# Patient Record
Sex: Male | Born: 1935 | Race: White | Hispanic: No | Smoking: Former smoker
Health system: Southern US, Community
[De-identification: ages and names within clinical notes are randomized; demographics above are authoritative.]

## PROBLEM LIST (undated history)

## (undated) DIAGNOSIS — R5381 Other malaise: Secondary | ICD-10-CM

## (undated) DIAGNOSIS — K5900 Constipation, unspecified: Secondary | ICD-10-CM

## (undated) DIAGNOSIS — N183 Chronic kidney disease, stage 3 unspecified: Secondary | ICD-10-CM

## (undated) DIAGNOSIS — E079 Disorder of thyroid, unspecified: Secondary | ICD-10-CM

## (undated) DIAGNOSIS — R0600 Dyspnea, unspecified: Secondary | ICD-10-CM

## (undated) DIAGNOSIS — I10 Essential (primary) hypertension: Secondary | ICD-10-CM

## (undated) DIAGNOSIS — I480 Paroxysmal atrial fibrillation: Secondary | ICD-10-CM

## (undated) DIAGNOSIS — I5032 Chronic diastolic (congestive) heart failure: Secondary | ICD-10-CM

## (undated) DIAGNOSIS — I498 Other specified cardiac arrhythmias: Secondary | ICD-10-CM

## (undated) DIAGNOSIS — J449 Chronic obstructive pulmonary disease, unspecified: Secondary | ICD-10-CM

## (undated) DIAGNOSIS — I509 Heart failure, unspecified: Secondary | ICD-10-CM

## (undated) DIAGNOSIS — D72829 Elevated white blood cell count, unspecified: Secondary | ICD-10-CM

## (undated) DIAGNOSIS — H02109 Unspecified ectropion of unspecified eye, unspecified eyelid: Secondary | ICD-10-CM

## (undated) DIAGNOSIS — I499 Cardiac arrhythmia, unspecified: Secondary | ICD-10-CM

## (undated) DIAGNOSIS — K219 Gastro-esophageal reflux disease without esophagitis: Secondary | ICD-10-CM

## (undated) DIAGNOSIS — I493 Ventricular premature depolarization: Secondary | ICD-10-CM

## (undated) DIAGNOSIS — Z8489 Family history of other specified conditions: Secondary | ICD-10-CM

## (undated) DIAGNOSIS — D62 Acute posthemorrhagic anemia: Secondary | ICD-10-CM

## (undated) DIAGNOSIS — S72142A Displaced intertrochanteric fracture of left femur, initial encounter for closed fracture: Secondary | ICD-10-CM

## (undated) HISTORY — DX: Displaced intertrochanteric fracture of left femur, initial encounter for closed fracture: S72.142A

## (undated) HISTORY — DX: Dyspnea, unspecified: R06.00

## (undated) HISTORY — DX: Disorder of thyroid, unspecified: E07.9

## (undated) HISTORY — DX: Chronic diastolic (congestive) heart failure: I50.32

## (undated) HISTORY — DX: Other malaise: R53.81

## (undated) HISTORY — PX: SPLENECTOMY, PARTIAL: SHX787

## (undated) HISTORY — PX: CHOLECYSTECTOMY: SHX55

## (undated) HISTORY — DX: Unspecified ectropion of unspecified eye, unspecified eyelid: H02.109

## (undated) HISTORY — DX: Chronic kidney disease, stage 3 (moderate): N18.3

## (undated) HISTORY — DX: Constipation, unspecified: K59.00

## (undated) HISTORY — DX: Ventricular premature depolarization: I49.3

## (undated) HISTORY — DX: Paroxysmal atrial fibrillation: I48.0

## (undated) HISTORY — DX: Acute posthemorrhagic anemia: D62

## (undated) HISTORY — DX: Heart failure, unspecified: I50.9

## (undated) HISTORY — DX: Chronic kidney disease, stage 3 unspecified: N18.30

## (undated) HISTORY — DX: Elevated white blood cell count, unspecified: D72.829

---

## 1998-07-14 ENCOUNTER — Ambulatory Visit (HOSPITAL_COMMUNITY): Admission: RE | Admit: 1998-07-14 | Discharge: 1998-07-14 | Payer: Self-pay | Admitting: Family Medicine

## 1998-07-14 ENCOUNTER — Encounter: Payer: Self-pay | Admitting: Family Medicine

## 1998-08-13 ENCOUNTER — Observation Stay (HOSPITAL_COMMUNITY): Admission: RE | Admit: 1998-08-13 | Discharge: 1998-08-14 | Payer: Self-pay

## 2004-12-08 ENCOUNTER — Ambulatory Visit: Payer: Self-pay | Admitting: Family Medicine

## 2004-12-08 ENCOUNTER — Observation Stay (HOSPITAL_COMMUNITY): Admission: EM | Admit: 2004-12-08 | Discharge: 2004-12-09 | Payer: Self-pay | Admitting: Emergency Medicine

## 2006-11-29 ENCOUNTER — Encounter: Admission: RE | Admit: 2006-11-29 | Discharge: 2006-11-29 | Payer: Self-pay | Admitting: Family Medicine

## 2006-11-30 ENCOUNTER — Encounter (INDEPENDENT_AMBULATORY_CARE_PROVIDER_SITE_OTHER): Payer: Self-pay | Admitting: Gastroenterology

## 2006-11-30 ENCOUNTER — Ambulatory Visit (HOSPITAL_COMMUNITY): Admission: RE | Admit: 2006-11-30 | Discharge: 2006-12-01 | Payer: Self-pay | Admitting: Gastroenterology

## 2006-12-03 ENCOUNTER — Ambulatory Visit (HOSPITAL_COMMUNITY): Admission: RE | Admit: 2006-12-03 | Discharge: 2006-12-03 | Payer: Self-pay | Admitting: Gastroenterology

## 2007-06-13 ENCOUNTER — Encounter: Admission: RE | Admit: 2007-06-13 | Discharge: 2007-06-13 | Payer: Self-pay | Admitting: Gastroenterology

## 2008-08-18 ENCOUNTER — Ambulatory Visit (HOSPITAL_COMMUNITY): Admission: RE | Admit: 2008-08-18 | Discharge: 2008-08-18 | Payer: Self-pay | Admitting: Family Medicine

## 2010-07-26 NOTE — H&P (Signed)
Jordan [name], Jordan [name]                ACCOUNT NO.:  [id number]   MEDICAL RECORD NO.:  [id number]          PATIENT TYPE:  OIB   LOCATION:  5729                         FACILITY:  [facility]   PHYSICIAN:  [name], MDDATE OF BIRTH:  10-20-1918   DATE OF ADMISSION:  11/30/2006  DATE OF DISCHARGE:                              HISTORY & PHYSICAL   ADMISSION DIAGNOSES:  1. Dysphagia.  2. Esophageal stricture.   HISTORY OF PRESENT ILLNESS:  An 78 year old, white male who reports a  prolonged history of trouble swallowing foods where he feels like it  hangs up which he attributes to a hiatal hernia that he says he has.  He  was seen by his primary care physician regarding this dysphagia and had  a swallowing study done on November 29, 2006, which showed a short  segment narrowing versus stricture at the superior margin of a small,  sliding hiatal hernia.  A 13-mm tablet did not pass and the short  segment stricture was noted to be wider than a typical superior  esophageal ring.  After conferring with his primary doctor and conferred  with Dr. Ewing Schlein, he was sent over for outpatient upper endoscopy.  During  his upper endoscopy, he had a tight stricture at his GE junction  concerning for malignancy.  There was no obvious mass, but the cardia  was nodular and biopsies were taken and dilation was not attempted.  The  patient will be placed on observation status and a CT scan will be  obtained.  He will be given a liquid diet and IV fluids.  The patient  may undergo dilation pending pathology results and CAT scan findings.      [name], MD  Electronically Signed     VCS/MEDQ  D:  11/30/2006  T:  12/01/2006  Job:  [id number]   cc:   [name], M.D.  Petra Kuba, M.D.

## 2010-07-26 NOTE — Op Note (Signed)
Jordan [name], [name]                ACCOUNT NO.:  [id number]   MEDICAL RECORD NO.:  [id number]          PATIENT TYPE:  AMB   LOCATION:  ENDO                         FACILITY:  [name]   PHYSICIAN:  Petra Kuba, M.D.    DATE OF BIRTH:  [birth year]   DATE OF PROCEDURE:  12/03/2006  DATE OF DISCHARGE:                               OPERATIVE REPORT   PROCEDURE:  Esophagogastroduodenoscopy with balloon dilatation.   INDICATIONS FOR PROCEDURE:  Patient with dysphagia, esophageal  stricture, nondiagnostic biopsies and CT scan. Consent was signed after  risks, benefits, methods, options survey discussed with the patient and  his daughter.   MEDICINES USED:  Fentanyl 50 mcg, Versed 5 mg.   DESCRIPTION OF PROCEDURE:  The video endoscope was inserted by direct  vision. Again, in the distal esophagus a tight ring was seen, unable to  pass the scope. We did go ahead and advance by direct vision the  adjustable 8-cm balloon 10 to 12 mm and went ahead and inflated it,  first to 10 mm, held that for 30 seconds, then increased it to 11 mm for  30 seconds and then to 12 for 45 seconds. Once the balloon was deflated  we were easily able to advance the scope into the stomach. Scope was  advanced to the antrum pertinent for some mild antritis, through a  normal pylorus into a normal duodenal bulb and around the C-lip to a  normal second portion of the duodenum.   The scope was withdrawn back to the bulb and a good look there ruled out  ulcers in that location.   The scope was withdrawn back to the stomach and retroflexed. There was  minimal heme coming from the small hiatal hernia in the cardia. No  obvious mass lesion was seen. The fundus, angularis, lesser and greater  curve were normal on retroflexed visualization. Straight visualization  of the stomach did not reveal any additional findings. We went ahead and  slowly withdrew back to 20 cm. Again, his esophagus was slightly  tortuous. The ring  had a little bit of heme but no active bleeding or  significant tear. After reevaluating it, we elected to go ahead and  balloon dilate it to the next size. The adjustable 12 to 15 mm balloon  was advanced. It was an 8 cm long balloon. We blew up this balloon to  13.5 mm for 30 seconds and then increased to 15 for 1 minute. The  balloon was deflated. Again, we were easily able to advance back in the  stomach, air was suctioned.   The scope was slowly withdrawn. There was a little bit of ooze from  trauma from the 15 but no obvious significant complication. The scope  was removed. The patient tolerated the procedure well. There was no  obvious immediate complication.   ENDOSCOPIC DIAGNOSES:  1. Tight ring in the distal esophagus, unable to pass the regular      scope, status post balloon dilatation initially to 12 mm and then      passed the scope.  2. Minimal antritis.  3. Small hiatal hernia.  4. Otherwise normal esophagogastroduodenoscopy therapy, balloon      dilatation to 15 mm using the 8 cm to different adjustable balloon      plan. Observe for delayed complications. Slowly advance diet. No      BCs, aspirin, nonsteroidals. Tylenol okay. Pump inhibitors long      term. Follow up p.r.n. or in 1 month. Repeat dilation p.r.n.           ______________________________  Petra Kuba, M.D.     MEM/MEDQ  D:  12/03/2006  T:  12/04/2006  Job:  [id number]   cc:   [name], M.D.  Fax: [phone]   [name], MD  Fax: [phone]

## 2010-07-26 NOTE — Op Note (Signed)
NAMEGLENDON, Jordan [name]                ACCOUNT NO.:  [id number]   MEDICAL RECORD NO.:  [id number]          PATIENT TYPE:  OIB   LOCATION:  2866                         FACILITY:  [facility]   PHYSICIAN:  [name], MDDATE OF BIRTH:  [birth year]   DATE OF PROCEDURE:  11/30/2006  DATE OF DISCHARGE:                               OPERATIVE REPORT   PROCEDURE PERFORMED:  Upper endoscopy.   INDICATIONS:  Dysphagia, abnormal barium swallow showing a short segment  stricture versus lesion at the GE junction.   MEDICATIONS:  Fentanyl 70 mcg IV, Versed 5 mg IV.   FINDINGS:  The standard upper endoscope was inserted into the oropharynx  and the esophagus was intubated.  The endoscope was advanced down to the  distal portion of the esophagus where a food bolus was noted obscuring  the lumen.  A Rothnet was used to remove this food bolus.  Upon  reinsertion of the standard endoscope back into the oropharynx and down  to the esophagus, a tight stricture was noted at the GE junction.  This  prevented passage of the standard endoscope through this stricture.  The  standard endoscope was removed and a pediatric upper endoscope was  inserted which has an outer diameter of 8 mm.  This was advanced down to  the level of the stricture and advanced through the stricture without  any resistance into the stomach.  Retroflexion was done which revealed a  nodular cardia with no ulceration but nodular mucosa on one area of the  cardia.  No fungating mass was noted but the cardia was bulging right at  the GE junction.  The endoscope was straightened and advanced down into  the distal stomach which was normal in appearance.  The endoscope was  advanced into the duodenal bulb and second portion of the duodenum which  were both normal.  The endoscope was withdrawn back into the stomach.  The decision was made to do biopsies without dilation due to the  appearance of this stricture.  Biopsies were taken on the  cardia side as  well as the esophageal side.  Again, dilation was not attempted and  fluoroscopy was not necessary.   ASSESSMENT:  1. Tight stricture at the gastroesophageal junction (42 cm from the      incisors) concerning for malignant stricture status post multiple      biopsies.  2. Dilation not attempted due to concern for malignancy.   PLAN:  1. Placed on observation status.  2. Obtain a CT scan of the abdomen and chest to further evaluate this      stricture.  3. Clear liquid diet and IV fluids.  4. Follow up on path.  5. If the patient tolerates a liquid diet and CT scan is negative,      will need to decide whether to dilate prior to      pathology resulting.  My recommendation would be to hold off on      dilation until pathology has returned, that is assuming he can      tolerate a liquid diet.  Further  recommendation will be forthcoming      pending pathology and CAT scan findings.      [name], MD  Electronically Signed     VCS/MEDQ  D:  11/30/2006  T:  12/01/2006  Job:  [id number]   cc:   [name], M.D.  Petra Kuba, M.D.

## 2010-07-29 NOTE — Discharge Summary (Signed)
Jordan [name], Jordan [name]                ACCOUNT NO.:  [id number]   MEDICAL RECORD NO.:  [id number]          PATIENT TYPE:  INP   LOCATION:  3703                         FACILITY:  [facility]   PHYSICIAN:  Leighton Roach McDiarmid, M.D.DATE OF BIRTH:  10-May-1918   DATE OF ADMISSION:  12/08/2004  DATE OF DISCHARGE:  12/09/2004                                 DISCHARGE SUMMARY   ADDENDUM:   DISCHARGE INSTRUCTIONS:  1.  The patient is to follow up with Dr. Jeannetta Nap at [facility] on Wednesday, December 14, 2004, at 9:30 a.m.  2.  He is to go to his scheduled MRI appointment of the left shoulder as      previously scheduled.   PROCEDURE:  Left shoulder films were negative for a fracture.  Did show  osteopenia, but no tumor is present.      Alanson Puls, M.D.    ______________________________  Leighton Roach McDiarmid, M.D.    MR/MEDQ  D:  12/09/2004  T:  12/09/2004  Job:  [id number]   cc:   Attention:  Dr. Elly Modena [facility]

## 2010-07-29 NOTE — H&P (Signed)
NAMESEBRON, MCMAHILL                ACCOUNT NO.:  [id number]   MEDICAL RECORD NO.:  [id number]          PATIENT TYPE:  INP   LOCATION:  1823                         FACILITY:  [facility]   PHYSICIAN:  Leighton Roach McDiarmid, M.D.DATE OF BIRTH:  April 17, 1918   DATE OF ADMISSION:  12/08/2004  DATE OF DISCHARGE:                                HISTORY & PHYSICAL   CHIEF COMPLAINT:  Syncope.   PRIMARY CARE PHYSICIAN:  [name], M.D. at [facility].   HISTORY OF PRESENT ILLNESS:  Mr. Milliman is an 77 year old white male with a  history of hiatal hernia and left shoulder pain x1 week, treated for  osteoarthritis, who presents with a syncopal episode x1 today while awaiting  lunch at the [facility].  He denies any chest pain, shortness of  breath, or palpitations at the time of the episodes.  The patient states  that he was not exerting himself.  He states that he had diaphoresis, but  denied any amaurosis fugax or weakness at the time.  He states that he  thinks this episode is related to taking two Tylenol No.3 tablets last night  as well as two this morning when he usually takes one tablet for his left  shoulder pain.  The patient denies any fevers, chills, weight gain, or  weight loss, no nausea and vomiting.  He denies any change in stool  including diarrhea or constipation, no melena or hematochezia.  He denies  dysuria.   PAST MEDICAL HISTORY:  1.  Left shoulder pain of 1 weeks' duration.  2.  Hiatal hernia.   PAST SURGICAL HISTORY:  Splenectomy.  Cholecystectomy in 2000.   MEDICATIONS:  1.  TUMS.  2.  Prednisone 15 mg begin on November 30, 2004, discontinue on December 07, 2004.  3.  Tylenol No.3 which includes acetaminophen and Codeine 330.   FAMILY HISTORY:  Mother who is deceased, died at age 62 from gastric cancer.  Father deceased at age 19, died of a stroke.  No family history of diabetes,  hypercholesterolemia, hypertension, or heart  attack.   SOCIAL HISTORY:  The patient lives in [place], [place], and  lives with his daughter.  The patient has five daughters and two sons.  There is a remote tobacco history.  The patient states that he quit smoking  15 years ago, but previously smoked one pack per day.  He denies any alcohol  use or any illegal drug use.  He does not chew or dip tobacco.   REVIEW OF SYSTEMS:  Negative except for HPI.   PHYSICAL EXAMINATION:  VITAL SIGNS:  Temperature 97.5, blood pressure  127/59, heart rate 67, respiratory rate 22, pulse oximetry 94% on room air.  Weight is 88 kg, approximately 195 pounds.  GENERAL:  The patient is in no acute distress.  HEENT:  Pupils equal, round, and reactive to light and accommodation.  Extraocular muscles are intact.  There is swelling around the lower eye  lids.  HEART:  Regular rate and rhythm, heart rate in the 60s,  no murmurs heard.  No JVD or bruit.  Orthostatic vital signs were obtained, lying down 117/78  heart rate 58, sitting 127/76 heart rate 63, standing 125/66 heart rate 67.  LUNGS:  There were occasional end-expiratory wheezes.  ABDOMEN:  Obese with small cherry angiomas, no tenderness.  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  There is no edema.  The  patient has 2+ dorsalis pedis pulses.  NEUROLOGY:  No slurred speech or facial paresis.  5/5 upper and lower  extremity.   LABORATORY DATA:  CBC; white blood cell count 15.6, hemoglobin 16.1,  hematocrit 45.1, platelets 304 with 65% segs.  Chemistry panel; sodium 135,  potassium 4.2, chloride 101, bicarb 26, BUN 28, creatinine 1.6, glucose 116.  PT 13, PTT 22, INR 1.   EKG shows sinus bradycardia at 50 to 60 beats per minute, PR interval of  150, QRS of 92, QTC of 433.  No evidence of hypertrophy or ischemia.  Point  of care enzymes; CK-MB less than 1, troponin less than 0.05, myoglobin 42.6.   CT of  the head; no acute intracranial abnormality.  Chest x-ray shows  diffuse peribronchial thickening, scarring in the right upper lobe  consistent with COPD and flattened diaphragm.   Urinalysis; specific gravity of 1.029, pH 5, negative for nitrates and  leukocytes.   ASSESSMENT:  1.  This is an 77 year old with a history of hiatal hernia and left shoulder      pain.  2.  Syncope.  Consider a cardiac cause.  We will place the patient on a      telemetry bed, repeat cardiac enzymes three times q.8 hours and add on a      BNP to his next set of enzymes.  We will also watch for tachybrady      syndrome and further risk stratify the patient, given his age of 83.  We      will check a fasting lipid profile.  No further history of diabetes,      just a family history of stroke.  We will also get a TSH to rule out      mixed edema and repeat the EKG in the a.m.  For his bradycardia, since      he is sinus brady, consider vagal versus metabolic abnormalities or      medication induced.  We will continue to monitor.  3.  Chronic obstructive pulmonary disease suggestive on a chest x-ray and a      positive tobacco history, and the large lung volumes, we will give the      patient albuterol 2.5 mg q.6 hours nebulizers scheduled as well as      Atrovent 0.5 mg q.4 hours scheduled nebulizers with q.2 p.r.n.      albuterol.  We will monitor the patient and consider going to p.r.n.      nebulizers in the morning.  The patient was recently on a course of      prednisone on an outpatient basis for left shoulder pain.  This may      explain his increased white blood cell count.  We will defer on giving      the patient steroids at this time for COPD.  4.  Fluid, electrolytes, nutrition.  We will give the patient normal saline      at 50 an hour since he is taking  adequate p.o. and consider locking      fluids in the morning.  Electrolytes are stable.  He will receive a soft     mechanical diet secondary  to his hiatal hernia and per the patient's      request.   DISPOSITION:  We will continue to follow until we clarify the origin of the  patient's syncope.      Alanson Puls, M.D.    ______________________________  Leighton Roach McDiarmid, M.D.   MR/MEDQ  D:  12/08/2004  T:  12/09/2004  Job:  [id number]

## 2010-07-29 NOTE — Discharge Summary (Signed)
NAMEVERNARD, Jordan [name]                ACCOUNT NO.:  [id number]   MEDICAL RECORD NO.:  [id number]          PATIENT TYPE:  INP   LOCATION:  3703                         FACILITY:  [facility]   PHYSICIAN:  Alanson Puls, M.D.    DATE OF BIRTH:  [birth year]   DATE OF ADMISSION:  12/08/2004  DATE OF DISCHARGE:  12/09/2004                                 DISCHARGE SUMMARY   ATTENDING PHYSICIAN AT DISCHARGE:  Dr. Tawanna Cooler McDiarmid.   DISCHARGE DIAGNOSES:  1.  Iatrogenic syncope.  2.  Left shoulder pain, likely osteoarthritis.  3.  Hypertension.   PROCEDURES DURING HOSPITALIZATION:  1.  CT scan of the head without contrast.  2.  Films of the left arm and shoulder.   CONSULTATIONS:  None.   HOSPITAL COURSE:  In brief, Jordan [name] is an 77 year old white male with a  history of hiatal hernia and left shoulder pain thought to be osteoarthritis  who presented with a syncopal episode x1 yesterday while awaiting lunch at  the [facility].  He denied any chest pain or shortness of breath  or palpitations at that time.  Denied any amaurosis fugax, but did state  that he was diaphoretic.  The patient thinks that this episode is related to  taking 2 Tylenol No. 2, at least 2 hours apart and 2 hours before the  syncopal event.  Patient states that he usually takes 1 Tylenol No. 2 for  his left shoulder pain.  He was recently on prednisone 50 mg p.o. regimen  for 7 days and discontinued secondary to no improvement in his left shoulder  pain and that is when he was placed on Tylenol No. 3.  Given the patient's  history of syncope, we were considering a cardiac cause.  Cardiac enzymes  were negative x2 and his BNP was less than 30.  Initial EKG did show sinus  bradycardia.  The patient was placed on a telemetry bed with no episodes of  tachycardia or bradycardia over night.  On exam, there was no evidence of  stroke and CT scan of the head was negative for any intracranial  abnormalities.   For  further risk stratification, a fasting lipid profile was obtained, which  results are pending at this time. TSH was also within normal limits at  2.941.  There were no electrolyte abnormalities at the time, and  particularly the patient's fingerstick blood sugar was 122.  Orthostatic  vital signs were obtained, which were negative.   For the patient's bradycardia, sinus bradycardia, considered vagal/metabolic  abnormality or medication induced; however, he was not on any beta blockers  at this time and no other episodes of bradycardia throughout his  hospitalization.   There is questionable history of COPD.  Patient states that he smoked for a  number of years and quit 15 years ago.  His chest x-ray did not reveal any  infiltrate or pneumonia, but it did show low lung volumes with flattened  diaphragm.  We placed the patient on albuterol 2.5 mg q.6 and Atrovent 0.5  mg q.4 scheduled nebs and then changed  them over to p.r.n.  The patient's  respiratory status did improve and he no longer had any wheezing.  At some  point, we do recommend formal pulmonary function tests to assess the  patient's lung status.   Secondary to the left shoulder pain, we also considered types of referred  pain.  Although the patient did not have any complains of nausea and  vomiting, if this continues to occur, we recommend checking amylase and  lipase.   Films of the left arm and shoulder were obtained, which are pending at this  time.  We have results sent to Dr. Jeannetta Nap' office.  Patient did have  tenderness over the deltoid insertion, and if this is thought to be  osteoarthritis, instead of using p.o. pain meds, we recommend that maybe the  patient try a corticosteroid injection at that site.   Fluid, electrolytes and nutrition:  The patient was initially placed on  normal saline at 50.  He was taking adequate p.o.'s, so his IV fluids were  discontinued.  His electrolytes were stable, and he was placed on  a soft  mechanical diet secondary to his hiatal hernia.   For hypertension, the patient previously carried no diagnosis of  hypertension; but, of note, while in the hospital, his systolic blood  pressures ranged from 127-148.  He was not placed on any antihypertensive  medications, but we recommend followup on an outpatient basis.   DISCHARGE MEDICATIONS:  1.  Aspirin 81 mg p.o. q.d.  2.  Albuterol metered dose inhaler to take 2 puffs q.4-6h. p.r.n. wheeze      with 1 refill given.  All other refills through primary care physician.   FOLLOWUP INSTRUCTIONS:  The patient is to follow up with Dr. Jeannetta Nap at  [facility] as previously scheduled, because the patient  is to call to schedule followup appointment.  He was told to return to the  clinic or the emergency department for syncope, dizziness, lightheadedness,  weakness, or any other concerns.      Alanson Puls, M.D.     MR/MEDQ  D:  12/09/2004  T:  12/09/2004  Job:  [id number]

## 2010-12-22 LAB — BASIC METABOLIC PANEL
BUN: 20
CO2: 27
Chloride: 105
Chloride: 108
Creatinine, Ser: 1.36
GFR calc Af Amer: >60
Potassium: 3.6
Potassium: 4
Sodium: 138

## 2010-12-22 LAB — CBC
HCT: 39.5
HCT: 43.5
Hemoglobin: 14.2
Hemoglobin: 15.3
MCHC: 35.9
MCV: 94.6
Platelets: 308
RBC: 4.18 — ABNORMAL LOW
WBC: 10.8 — ABNORMAL HIGH
WBC: 9.8

## 2010-12-22 LAB — HEPATIC FUNCTION PANEL
ALT: 12
Alkaline Phosphatase: 79
Bilirubin, Direct: 0.2
Indirect Bilirubin: 0.9
Total Bilirubin: 1.1

## 2010-12-29 ENCOUNTER — Ambulatory Visit
Admission: RE | Admit: 2010-12-29 | Discharge: 2010-12-29 | Disposition: A | Discharge status: Home / Self Care | Payer: Medicare Other | Source: Ambulatory Visit | Attending: Family Medicine | Admitting: Family Medicine

## 2010-12-29 ENCOUNTER — Other Ambulatory Visit: Payer: Self-pay | Admitting: Family Medicine

## 2010-12-29 DIAGNOSIS — M545 Low back pain: Secondary | ICD-10-CM

## 2012-12-16 ENCOUNTER — Inpatient Hospital Stay (HOSPITAL_COMMUNITY)
Admission: EM | Admit: 2012-12-16 | Discharge: 2012-12-20 | DRG: 309 | Disposition: A | Discharge status: Home / Self Care | Payer: Medicare Other | Attending: Cardiology | Admitting: Cardiology

## 2012-12-16 ENCOUNTER — Emergency Department (HOSPITAL_COMMUNITY): Payer: Medicare Other

## 2012-12-16 ENCOUNTER — Encounter (HOSPITAL_COMMUNITY): Payer: Self-pay | Admitting: Emergency Medicine

## 2012-12-16 DIAGNOSIS — J449 Chronic obstructive pulmonary disease, unspecified: Secondary | ICD-10-CM | POA: Diagnosis present

## 2012-12-16 DIAGNOSIS — I509 Heart failure, unspecified: Secondary | ICD-10-CM | POA: Diagnosis present

## 2012-12-16 DIAGNOSIS — R0600 Dyspnea, unspecified: Secondary | ICD-10-CM

## 2012-12-16 DIAGNOSIS — J4489 Other specified chronic obstructive pulmonary disease: Secondary | ICD-10-CM | POA: Diagnosis present

## 2012-12-16 DIAGNOSIS — E860 Dehydration: Secondary | ICD-10-CM | POA: Diagnosis present

## 2012-12-16 DIAGNOSIS — Z7901 Long term (current) use of anticoagulants: Secondary | ICD-10-CM

## 2012-12-16 DIAGNOSIS — I10 Essential (primary) hypertension: Secondary | ICD-10-CM | POA: Diagnosis present

## 2012-12-16 DIAGNOSIS — I4891 Unspecified atrial fibrillation: Principal | ICD-10-CM | POA: Diagnosis present

## 2012-12-16 DIAGNOSIS — R001 Bradycardia, unspecified: Secondary | ICD-10-CM | POA: Diagnosis present

## 2012-12-16 DIAGNOSIS — I2510 Atherosclerotic heart disease of native coronary artery without angina pectoris: Secondary | ICD-10-CM | POA: Diagnosis present

## 2012-12-16 DIAGNOSIS — N179 Acute kidney failure, unspecified: Secondary | ICD-10-CM | POA: Diagnosis present

## 2012-12-16 DIAGNOSIS — N183 Chronic kidney disease, stage 3 unspecified: Secondary | ICD-10-CM | POA: Diagnosis present

## 2012-12-16 DIAGNOSIS — Z87891 Personal history of nicotine dependence: Secondary | ICD-10-CM

## 2012-12-16 DIAGNOSIS — J441 Chronic obstructive pulmonary disease with (acute) exacerbation: Secondary | ICD-10-CM

## 2012-12-16 DIAGNOSIS — I129 Hypertensive chronic kidney disease with stage 1 through stage 4 chronic kidney disease, or unspecified chronic kidney disease: Secondary | ICD-10-CM | POA: Diagnosis present

## 2012-12-16 DIAGNOSIS — K219 Gastro-esophageal reflux disease without esophagitis: Secondary | ICD-10-CM | POA: Diagnosis present

## 2012-12-16 DIAGNOSIS — E039 Hypothyroidism, unspecified: Secondary | ICD-10-CM | POA: Diagnosis not present

## 2012-12-16 DIAGNOSIS — I5030 Unspecified diastolic (congestive) heart failure: Secondary | ICD-10-CM | POA: Diagnosis present

## 2012-12-16 DIAGNOSIS — I498 Other specified cardiac arrhythmias: Secondary | ICD-10-CM | POA: Diagnosis present

## 2012-12-16 DIAGNOSIS — I4892 Unspecified atrial flutter: Secondary | ICD-10-CM | POA: Diagnosis not present

## 2012-12-16 HISTORY — DX: Gastro-esophageal reflux disease without esophagitis: K21.9

## 2012-12-16 HISTORY — DX: Essential (primary) hypertension: I10

## 2012-12-16 HISTORY — DX: Chronic obstructive pulmonary disease, unspecified: J44.9

## 2012-12-16 HISTORY — DX: Cardiac arrhythmia, unspecified: I49.9

## 2012-12-16 HISTORY — DX: Other specified cardiac arrhythmias: I49.8

## 2012-12-16 LAB — POCT I-STAT, CHEM 8
HCT: 42 % (ref 39.0–52.0)
Hemoglobin: 14.3 g/dL (ref 13.0–17.0)
Sodium: 144 mEq/L (ref 135–145)
TCO2: 21 mmol/L (ref 0–100)

## 2012-12-16 LAB — CBC
MCHC: 35.4 g/dL (ref 30.0–36.0)
Platelets: 204 10*3/uL (ref 150–400)
RDW: 14.3 % (ref 11.5–15.5)
WBC: 11 10*3/uL — ABNORMAL HIGH (ref 4.0–10.5)

## 2012-12-16 LAB — PROTIME-INR: INR: 0.97 (ref 0.00–1.49)

## 2012-12-16 LAB — POCT I-STAT TROPONIN I: Troponin i, poc: 0.01 ng/mL (ref 0.00–0.08)

## 2012-12-16 LAB — APTT: aPTT: 24 seconds (ref 24–37)

## 2012-12-16 LAB — MRSA PCR SCREENING: MRSA by PCR: NEGATIVE

## 2012-12-16 MED ORDER — PANTOPRAZOLE SODIUM 40 MG PO TBEC
40.0000 mg | DELAYED_RELEASE_TABLET | Freq: Every day | ORAL | Status: DC
  Administered 2012-12-17 – 2012-12-20 (×4): 40 mg via ORAL
  Filled 2012-12-16 (×4): qty 1

## 2012-12-16 MED ORDER — LATANOPROST 0.005 % OP SOLN
1.0000 [drp] | Freq: Every day | OPHTHALMIC | Status: DC
  Administered 2012-12-17 – 2012-12-19 (×4): 1 [drp] via OPHTHALMIC
  Filled 2012-12-16 (×2): qty 2.5

## 2012-12-16 MED ORDER — ALBUTEROL SULFATE (5 MG/ML) 0.5% IN NEBU
5.0000 mg | INHALATION_SOLUTION | Freq: Once | RESPIRATORY_TRACT | Status: AC
  Administered 2012-12-16: 5 mg via RESPIRATORY_TRACT
  Filled 2012-12-16: qty 1

## 2012-12-16 MED ORDER — ALBUTEROL SULFATE HFA 108 (90 BASE) MCG/ACT IN AERS
2.0000 | INHALATION_SPRAY | Freq: Four times a day (QID) | RESPIRATORY_TRACT | Status: DC | PRN
  Filled 2012-12-16: qty 6.7

## 2012-12-16 MED ORDER — ASPIRIN 81 MG PO CHEW
324.0000 mg | CHEWABLE_TABLET | Freq: Once | ORAL | Status: AC
  Administered 2012-12-16: 324 mg via ORAL
  Filled 2012-12-16: qty 4

## 2012-12-16 MED ORDER — HEPARIN SODIUM (PORCINE) 5000 UNIT/ML IJ SOLN
5000.0000 [IU] | Freq: Three times a day (TID) | INTRAMUSCULAR | Status: DC
  Administered 2012-12-17 – 2012-12-18 (×4): 5000 [IU] via SUBCUTANEOUS
  Filled 2012-12-16 (×7): qty 1

## 2012-12-16 MED ORDER — PREDNISONE 5 MG PO TABS
5.0000 mg | ORAL_TABLET | Freq: Every day | ORAL | Status: DC
  Administered 2012-12-17 – 2012-12-20 (×4): 5 mg via ORAL
  Filled 2012-12-16 (×5): qty 1

## 2012-12-16 MED ORDER — B COMPLEX-C PO TABS
1.0000 | ORAL_TABLET | Freq: Every day | ORAL | Status: DC
  Administered 2012-12-17 – 2012-12-20 (×4): 1 via ORAL
  Filled 2012-12-16 (×4): qty 1

## 2012-12-16 MED ORDER — ASPIRIN EC 81 MG PO TBEC
81.0000 mg | DELAYED_RELEASE_TABLET | Freq: Every day | ORAL | Status: DC
  Administered 2012-12-17 – 2012-12-18 (×2): 81 mg via ORAL
  Filled 2012-12-16 (×2): qty 1

## 2012-12-16 MED ORDER — LEVOTHYROXINE SODIUM 100 MCG PO TABS
100.0000 ug | ORAL_TABLET | Freq: Every day | ORAL | Status: DC
  Administered 2012-12-17 – 2012-12-20 (×4): 100 ug via ORAL
  Filled 2012-12-16 (×5): qty 1

## 2012-12-16 MED ORDER — NITROGLYCERIN 0.4 MG SL SUBL: 0.4000 mg | SUBLINGUAL_TABLET | SUBLINGUAL | Status: DC | PRN

## 2012-12-16 MED ORDER — ACETAMINOPHEN 325 MG PO TABS
650.0000 mg | ORAL_TABLET | ORAL | Status: DC | PRN
  Administered 2012-12-17: 650 mg via ORAL
  Filled 2012-12-16: qty 2

## 2012-12-16 NOTE — ED Notes (Signed)
Dr Miller at bedside. 

## 2012-12-16 NOTE — ED Notes (Signed)
Pt sent here from PCP for SOB and bradycardia; pt noted to possible bigeminy rhythm; pt sts SOB with exertion x 3 months; pt denies CP; pt sts some back pain; pt seen at PCP today for blood in stool that was from hemorrhoid

## 2012-12-16 NOTE — ED Provider Notes (Signed)
CSN: [id number]     Arrival date & time 12/16/12  1601 History   First MD Initiated Contact with Patient 12/16/12 1615     Chief Complaint  Patient presents with  . Shortness of Breath  . Bradycardia   (Consider location/radiation/quality/duration/timing/severity/associated sxs/prior Treatment) HPI  Pt is a 79 y/o male with hx of htn and CAD - presents with c/o SOB - worsening over 3 months but much worse over the last 2 weeks - he states he can't walk from his bedroom to his bedroom without sig SOB.  He denies coughing, fevers, swelling and states that he has no CP - he has seen Dr. Anne Fu in the past and states that his medications were recently changed in the last few months - he did not bring his medications with him and does not know the name of them.  Sx are gradually worsening, persistent, mostly exertional and not associated with laying down - he has no orthopnea.  There is some mention made in the EMR that pt has hx of COPD.    Past Medical History  Diagnosis Date  . Coronary artery disease   . Hypertension    History reviewed. No pertinent past surgical history. History reviewed. No pertinent family history. History  Substance Use Topics  . Smoking status: Never Smoker   . Smokeless tobacco: Not on file  . Alcohol Use: No    Review of Systems  All other systems reviewed and are negative.    Allergies  Review of patient's allergies indicates no known allergies.  Home Medications   Current Outpatient Rx  Name  Route  Sig  Dispense  Refill  . acetaminophen (TYLENOL) 500 MG tablet   Oral   Take 1,000 mg by mouth 3 (three) times daily.         Marland Kitchen albuterol (PROVENTIL HFA;VENTOLIN HFA) 108 (90 BASE) MCG/ACT inhaler   Inhalation   Inhale 2 puffs into the lungs every 6 (six) hours as needed for wheezing.         . B Complex-C (B-COMPLEX WITH VITAMIN C) tablet   Oral   Take 1 tablet by mouth daily.         Marland Kitchen latanoprost (XALATAN) 0.005 % ophthalmic solution  Both Eyes   Place 1 drop into both eyes at bedtime.         Marland Kitchen levothyroxine (SYNTHROID, LEVOTHROID) 100 MCG tablet   Oral   Take 100 mcg by mouth daily before breakfast.         . omeprazole (PRILOSEC) 20 MG capsule   Oral   Take 20 mg by mouth daily.         . predniSONE (DELTASONE) 10 MG tablet   Oral   Take 5 mg by mouth daily.          BP 122/51  Pulse 34  Temp(Src) 98.1 F (36.7 C) (Oral)  Resp 24  Ht 5\' 7"  (1.702 m)  Wt 186 lb (84.369 kg)  BMI 29.12 kg/m2  SpO2 95% Physical Exam  Nursing note and vitals reviewed. Constitutional: He appears well-developed and well-nourished. No distress.  HENT:  Head: Normocephalic and atraumatic.  Mouth/Throat: Oropharynx is clear and moist. No oropharyngeal exudate.  Eyes: Conjunctivae and EOM are normal. Pupils are equal, round, and reactive to light. Right eye exhibits no discharge. Left eye exhibits no discharge. No scleral icterus.  Neck: Normal range of motion. Neck supple. No JVD present. No thyromegaly present.  Cardiovascular: Normal heart sounds and intact  distal pulses.  Exam reveals no gallop and no friction rub.   No murmur heard. Bigeminy - palpable pulses at the radial arteries bilaterally.  No JVD, no edema  Pulmonary/Chest: Effort normal. No respiratory distress. He has no wheezes. He has no rales.  Dec BS bilaterally - speaking in full sentences  Abdominal: Soft. Bowel sounds are normal. He exhibits no distension and no mass. There is no tenderness.  Musculoskeletal: Normal range of motion. He exhibits no edema and no tenderness.  Lymphadenopathy:    He has no cervical adenopathy.  Neurological: He is alert. Coordination normal.  Skin: Skin is warm and dry. No rash noted. No erythema.  Psychiatric: He has a normal mood and affect. His behavior is normal.    ED Course  Procedures (including critical care time) Labs Review Labs Reviewed  CBC - Abnormal; Notable for the following:    WBC 11.0 (*)     MCV 100.5 (*)    MCH 35.6 (*)    All other components within normal limits  POCT I-STAT, CHEM 8 - Abnormal; Notable for the following:    Chloride 113 (*)    BUN 61 (*)    Creatinine, Ser 2.00 (*)    Glucose, Bld 114 (*)    All other components within normal limits  PROTIME-INR  APTT  POCT I-STAT TROPONIN I   Imaging Review Dg Chest Portable 1 View  12/16/2012   CLINICAL DATA:  Shortness of breath, bradycardia, ex-smoker  EXAM: PORTABLE CHEST - 1 VIEW  COMPARISON:  CT chest 06/13/2007 and chest radiograph 12/08/2004.  FINDINGS: Trachea is midline. Heart size stable. There is scarring in the right mid and right lower lung zones. Lungs are otherwise clear. No pleural fluid.  IMPRESSION: No acute findings.   Electronically Signed   By: Leanna Battles M.D.   On: 12/16/2012 17:30    MDM   1. Bradycardia   2. Acute kidney failure   3. Hypertension   4. Hypothyroidism    ECG shows bigeminy, but sig bradycardia between intrinsic beats.  He has normal BP and is mentating OK at this time.   Could be related to COPD / RAD as lung sounds are decreased but he is not hypoxic nor tachypneic making this less likely.  Has   ED ECG REPORT  I personally interpreted this EKG   Date: 12/16/2012   Rate: 67  Rhythm: sinus beats with PVC bigeminy  QRS Axis: normal  Intervals: normal  ST/T Wave abnormalities: normal  Conduction Disutrbances:none  Narrative Interpretation:   Old EKG Reviewed: c/w ECG from [facility] - has no sig changes - no other old tracings to compare  D/w Cardiology at 7:30 - when they returned page - they will see for bradycardia and exertional dyspnea.  Pt remains HD stable.  Pt admitted by cardiology - he has new onset worsening renal failure with elevated BUN and bradycardia.  Fluids gently given.  Vida Roller, MD 12/16/12 2052

## 2012-12-16 NOTE — H&P (Addendum)
Patient ID: Jordan [name] MRN: [id number], DOB/AGE: Oct 03, 1918   Admit date: 12/16/2012   Primary Physician: Kaleen Mask, MD Primary Cardiologist: Dr Donato Schultz  Pt. Profile: SOB on exertion, bradycardia  Problem List  Past Medical History  Diagnosis Date  . Coronary artery disease   . Hypertension     History reviewed. No pertinent past surgical history.   Allergies  No Known Allergies  HPI  79 year old male, who is a poor historian with h/o hypertension, hiatal hernia, GERD, followed by Dr Anne Fu (last seen 4 months ago). The patient states that approximately 2 months ago he was seen by his PCP for exertional shortness of breath. He was actively wheezing at the time and was given prescription of oral Prednisone that he is still taking. He reports some relief of SOB but he still feels limited in his daily activities by SOB. He was seen by his PCP again today and was found to be bradycardic and sent to the ER. The patient doesn't remember all of his medications, but mentions Atenolol for his hypertension and Lasix for lower extremity edema.  He states that in the past he has had an irregular heart rhythm with HR up to 160. He doesn't remember the last episode.  He denies any chest pain, palpitations, dizziness or syncope. He states that he feels well and wants to go home.  Home Medications  Prior to Admission medications   Medication Sig Start Date End Date Taking? Authorizing Provider  acetaminophen (TYLENOL) 500 MG tablet Take 1,000 mg by mouth 3 (three) times daily.   Yes Historical Provider, MD  albuterol (PROVENTIL HFA;VENTOLIN HFA) 108 (90 BASE) MCG/ACT inhaler Inhale 2 puffs into the lungs every 6 (six) hours as needed for wheezing.   Yes Historical Provider, MD  atenolol (TENORMIN) 25 MG tablet Take 25 mg by mouth daily.   Yes Historical Provider, MD  B Complex-C (B-COMPLEX WITH VITAMIN C) tablet Take 1 tablet by mouth daily.   Yes Historical Provider, MD    furosemide (LASIX) 20 MG tablet Take 10 mg by mouth daily as needed for fluid.   Yes Historical Provider, MD  latanoprost (XALATAN) 0.005 % ophthalmic solution Place 1 drop into both eyes at bedtime.   Yes Historical Provider, MD  levothyroxine (SYNTHROID, LEVOTHROID) 100 MCG tablet Take 100 mcg by mouth daily before breakfast.   Yes Historical Provider, MD  omeprazole (PRILOSEC) 20 MG capsule Take 20 mg by mouth daily.   Yes Historical Provider, MD  predniSONE (DELTASONE) 10 MG tablet Take 5 mg by mouth daily.   Yes Historical Provider, MD    Family History  History reviewed. No pertinent family history.  Social History  History   Social History  . Marital Status: Widowed    Spouse Name: N/A    Number of Children: N/A  . Years of Education: N/A   Occupational History  . Not on file.   Social History Main Topics  . Smoking status: Never Smoker   . Smokeless tobacco: Not on file  . Alcohol Use: No  . Drug Use: No  . Sexual Activity: Not on file   Other Topics Concern  . Not on file   Social History Narrative  . No narrative on file     Review of Systems General:  No chills, fever, night sweats or weight changes.  Cardiovascular:  No chest pain, dyspnea on exertion, edema, orthopnea, palpitations, paroxysmal nocturnal dyspnea. Dermatological: No rash, lesions/masses Respiratory: No cough,  dyspnea Urologic: No hematuria, dysuria Abdominal:   No nausea, vomiting, diarrhea, bright red blood per rectum, melena, or hematemesis Neurologic:  No visual changes, wkns, changes in mental status. All other systems reviewed and are otherwise negative except as noted above.  Physical Exam  Blood pressure 137/47, pulse 67, temperature 98.1 F (36.7 C), temperature source Oral, resp. rate 18, height 5\' 7"  (1.702 m), weight 186 lb (84.369 kg), SpO2 100.00%.  General: Pleasant, NAD Psych: Normal affect. Neuro: Alert and oriented X 3. Moves all extremities spontaneously. HEENT:  Normal  Neck: Supple without bruits or JVD. Lungs:  Resp regular and unlabored, CTA. Heart: RRR no s3, s4, or murmurs. Abdomen: Soft, non-tender, non-distended, BS + x 4.  Extremities: No clubbing, cyanosis or edema. DP/PT/Radials 2+ and equal bilaterally.  Labs  No results found for this basename: CKTOTAL, CKMB, TROPONINI,  in the last 72 hours Lab Results  Component Value Date   WBC 11.0* 12/16/2012   HGB 14.3 12/16/2012   HCT 42.0 12/16/2012   MCV 100.5* 12/16/2012   PLT 204 12/16/2012    Recent Labs Lab 12/16/12 1707  NA 144  K 4.5  CL 113*  BUN 61*  CREATININE 2.00*  GLUCOSE 114*    Radiology/Studies  Dg Chest Portable 1 View  12/16/2012   CLINICAL DATA:  Shortness of breath, bradycardia, ex-smoker  EXAM: PORTABLE CHEST - 1 VIEW  COMPARISON:  CT chest 06/13/2007 and chest radiograph 12/08/2004.  FINDINGS: Trachea is midline. Heart size stable. There is scarring in the right mid and right lower lung zones. Lungs are otherwise clear. No pleural fluid.  IMPRESSION: No acute findings.   Electronically Signed   By: Leanna Battles M.D.   On: 12/16/2012 17:30    ECG  Sinus bradycardia 37 BPM, frequent PVCs in bigeminy pattern     ASSESSMENT AND PLAN  79 year old gentleman with with h/o hypertension, paroxysmal atrial fibrillation and ? CHF who presented with shortness of breath and was found to be in sinus bradycardia with bigeminy  1. Sinus bradycardia with PVCs in bigeminy pattern - the patient is symptomatic with SOB on exertion, not at rest. He is found to be dehydrated with acute kidney failure and electrolyte imbalance. We will hold Lasix, check magnesium, TSH, free T4. We will give gentle hydration NaCl 75 cc/hour. We will hold beta-blockers and re-evaluate ECG. There is no need for an urgent pacemaker tonight. We will place pacer pads.   2.  Acute kidney insufficiency secondary to dehydration (unknown if there is underlying CKD) - we will obtain labs from [facility]  cardiology office tomorrow  3. ? CHF - the patient was on Lasix at home, he is not aware if has a diagnosis of CHF, we will order echocardiogram.  4. H/o paroxysmal atrial fibrillation - not on anticoagulation - again, we will obtain records from [facility] cardiology   5. H/o hypothyroidism - on replacement with Synthroid, we will check TSH, free 3 and free T4.   We were called for this patient by the ER physician. It wasn't until the end of the examination and interview that we found out that the patient has a primary cardiologist Dr. Donato Schultz. We will discuss the patient with [facility] cardiology in the morning and resume care.    Signed, Tobias Alexander, Rexene Edison, MD 12/16/2012, 8:00 PM

## 2012-12-17 ENCOUNTER — Encounter (HOSPITAL_COMMUNITY): Payer: Self-pay | Admitting: *Deleted

## 2012-12-17 DIAGNOSIS — J441 Chronic obstructive pulmonary disease with (acute) exacerbation: Secondary | ICD-10-CM

## 2012-12-17 DIAGNOSIS — R0609 Other forms of dyspnea: Secondary | ICD-10-CM

## 2012-12-17 LAB — CBC
HCT: 39.2 % (ref 39.0–52.0)
Hemoglobin: 13.7 g/dL (ref 13.0–17.0)
MCH: 34.9 pg — ABNORMAL HIGH (ref 26.0–34.0)
MCHC: 34.9 g/dL (ref 30.0–36.0)
MCV: 99.7 fL (ref 78.0–100.0)
Platelets: 177 10*3/uL (ref 150–400)
RBC: 3.93 MIL/uL — ABNORMAL LOW (ref 4.22–5.81)
RDW: 14.1 % (ref 11.5–15.5)
WBC: 12.2 10*3/uL — ABNORMAL HIGH (ref 4.0–10.5)

## 2012-12-17 LAB — CREATININE, SERUM
Creatinine, Ser: 1.67 mg/dL — ABNORMAL HIGH (ref 0.50–1.35)
GFR calc Af Amer: 39 mL/min — ABNORMAL LOW (ref >90)
GFR calc non Af Amer: 34 mL/min — ABNORMAL LOW (ref >90)

## 2012-12-17 LAB — MAGNESIUM: Magnesium: 1.5 mg/dL (ref 1.5–2.5)

## 2012-12-17 LAB — T4, FREE: Free T4: 1.65 ng/dL (ref 0.80–1.80)

## 2012-12-17 LAB — TSH: TSH: 0.821 u[IU]/mL (ref 0.350–4.500)

## 2012-12-17 MED ORDER — MAGNESIUM SULFATE 40 MG/ML IJ SOLN
2.0000 g | Freq: Once | INTRAMUSCULAR | Status: AC
  Administered 2012-12-17: 15:00:00 2 g via INTRAVENOUS
  Filled 2012-12-17 (×2): qty 50

## 2012-12-17 MED ORDER — INFLUENZA VAC SPLIT QUAD 0.5 ML IM SUSP
0.5000 mL | INTRAMUSCULAR | Status: AC
  Administered 2012-12-18: 0.5 mL via INTRAMUSCULAR
  Filled 2012-12-17: qty 0.5

## 2012-12-17 NOTE — Progress Notes (Signed)
Subjective:  Feels okay this morning. Still short of breath. No chest pain. No syncope. In review of outpatient records, he has no history of atrial fibrillation.  Objective:  Vital Signs in the last 24 hours: Temp:  [97.4 F (36.3 C)-98.3 F (36.8 C)] 97.4 F (36.3 C) (10/07 0800) Pulse Rate:  [33-103] 91 (10/07 0024) Resp:  [15-27] 22 (10/07 0500) BP: (105-154)/(41-84) 150/81 mmHg (10/07 0800) SpO2:  [87 %-100 %] 94 % (10/07 0800) Weight:  [186 lb (84.369 kg)-186 lb 11.7 oz (84.7 kg)] 186 lb 11.7 oz (84.7 kg) (10/07 0500)  Intake/Output from previous day: 10/06 0701 - 10/07 0700 In: -  Out: 350 [Urine:350]   Physical Exam: General: Elderly, Well developed, well nourished, in no acute distress. Head:  Normocephalic and atraumatic. Lungs: Distant breath sounds bilaterally, no active wheezing, mildly increased respiratory effort. Heart: Ventricular bigeminy pattern, frequent ectopy.  Distant heart sounds, soft systolic murmur left lower sternal border.  Abdomen: soft, non-tender, positive bowel sounds. Extremities: No clubbing or cyanosis. No edema. Neurologic: Alert and oriented x 3.    Lab Results:  Recent Labs  12/16/12 1600 12/16/12 1707 12/17/12 0232  WBC 11.0*  --  12.2*  HGB 15.7 14.3 13.7  PLT 204  --  177    Recent Labs  12/16/12 1707 12/17/12 0232  NA 144  --   K 4.5  --   CL 113*  --   GLUCOSE 114*  --   BUN 61*  --   CREATININE 2.00* 1.67*   Imaging: Dg Chest Portable 1 View  12/16/2012   CLINICAL DATA:  Shortness of breath, bradycardia, ex-smoker  EXAM: PORTABLE CHEST - 1 VIEW  COMPARISON:  CT chest 06/13/2007 and chest radiograph 12/08/2004.  FINDINGS: Trachea is midline. Heart size stable. There is scarring in the right mid and right lower lung zones. Lungs are otherwise clear. No pleural fluid.  IMPRESSION: No acute findings.   Electronically Signed   By: Leanna Battles M.D.   On: 12/16/2012 17:30   Personally viewed.   Telemetry:  Consistent ventricular bigeminy, frequent PVCs. Personally viewed.   Prior Holter monitor from 2012- 28,000 PVCs, ventricular bigeminy. No pauses, no ventricular tachycardia, no atrial fibrillation   Echocardiogram from 2012-normal ejection fraction  EKG:  Sinus rhythm with ventricular bigeminy  Cardiac Studies:  As above  Assessment/Plan:  Active Problems:   Bradycardia   Hypertension   Hypothyroidism   Acute kidney failure  79 year old with ventricular bigeminy, COPD-50 pack year smoking history with shortness of breath, diastolic dysfunction.  1. Ventricular bigeminy - in the past, he has tried diltiazem but this was ineffective for him. I also at one point tried low-dose metoprolol but this caused hallucinations and his daughter wished that he be switched back to atenolol 25 mg once a day which she is currently taking. Unfortunately, he is still having very frequent ectopy/ventricular bigeminy which is causing an effective heart rate in the 30s.  He states that when he saw his primary physician, Dr. Jeannetta Nap, the blood pressure cuff measured heart rate in the 30s and he sent him immediately to the emergency room for further evaluation. Once again, this is because of ventricular bigeminy which has been present for 2 years. Low-dose atenolol does not seem to help extinguish frequent PVCs.  At various points during outpatient followup, I did hear bilateral wheezing and thought that his shortness of breath was mainly being driven by underlying lung pathology/COPD. This morning, I do not hear active wheezing  but he certainly has poor air movement throughout. He is just getting off of the prednisone taper and states that he did have wheezing earlier.  Also, he came in with elevated creatinine and likely is intravascularly volume depleted. My suspicion in the past was that there was a small component of diastolic heart failure playing a role with his shortness of breath. However, he appeared dry  on this admission. This also points toward lung pathology.  Nonetheless, having an effective heart rate of 30 with ventricular bigeminy is contributing to his overall symptoms. I would like for electrophysiology to see and make any other recommendations. TSH pending, echocardiogram pending-prior echocardiogram showed normal ejection fraction.  2. Chronic kidney disease-baseline creatinine has been in the 1.5 range. Chronic kidney disease stage III. He's had gentle hydration overnight. Creatinine is currently 1.67 from 2.0. Magnesium slightly low at 1.5. Replete.  3. COPD-Dr. Jeannetta Nap has been treating with recent prednisone. Inhalers.  I will transfer him to telemetry.  [name], MARK 12/17/2012, 8:15 AM

## 2012-12-17 NOTE — Consult Note (Signed)
ELECTROPHYSIOLOGY CONSULT NOTE    Patient ID: Jordan [name] MRN: [id number], DOB/AGE: [birth year] 79 y.o.  Admit date: 12/16/2012 Date of Consult: 12-17-2012  Primary Physician: Kaleen Mask, MD Primary Cardiologist: Donato Schultz, MD  Reason for Consultation: bigeminal PVC's  HPI:  Jordan [name] is a 79 year old with a past medical history significant for hypertension, hiatal hernia, GERD, COPD, and PVC's.  He has had problems with exertional shortness of breath for the last 2-3 years.  He relates this to a shingles infection.  He also has had longstanding history of bigeminal PVC's with functional bradycardia. These have been treated in the past with Diltiazem (which by report caused tachycardia), Metoprolol which caused hallucinations, and Atenolol.  He was seen by his PCP yesterday who noted bradycardia and sent the patient to the ER for further evaluation.  The patient has been treated with Prednisone and Albuterol inhaler twice daily at home for COPD.  He feels as if his breathing is improved on Prednisone. He has no sensation of palpitations.  He does have orthostatic intolerance at times, but no frank syncope.  He denies chest pain, orthopnea, and lower extremity edema.    Lab work this admission has been notable for creatinine of 2, down to 1.67 with hydration, BUN of 61, normal K and Mg, TSH pending.    Holter monitor with Dr Anne Fu in 2012 demonstrated 28K PVCs'.  Echo at that time demonstrated normal LV function, mild to moderate MR, concentric mild LVH.   He lives with his daughter in [place].  He is widowed with 7 children.  He is retired from [employer].  EP has been asked to evaluate for treatment options.   ROS is negative except as outlined above.  Past Medical History  Diagnosis Date  . GERD (gastroesophageal reflux disease)   . Hypertension   . COPD (chronic obstructive pulmonary disease)   . Chronic kidney disease   . Ventricular bigeminy      Surgical History: History reviewed. No pertinent past surgical history.   Prescriptions prior to admission  Medication Sig Dispense Refill  . acetaminophen (TYLENOL) 500 MG tablet Take 1,000 mg by mouth 3 (three) times daily.      Marland Kitchen albuterol (PROVENTIL HFA;VENTOLIN HFA) 108 (90 BASE) MCG/ACT inhaler Inhale 2 puffs into the lungs every 6 (six) hours as needed for wheezing.      . B Complex-C (B-COMPLEX WITH VITAMIN C) tablet Take 1 tablet by mouth daily.      Marland Kitchen latanoprost (XALATAN) 0.005 % ophthalmic solution Place 1 drop into both eyes at bedtime.      Marland Kitchen levothyroxine (SYNTHROID, LEVOTHROID) 100 MCG tablet Take 100 mcg by mouth daily before breakfast.      . omeprazole (PRILOSEC) 20 MG capsule Take 20 mg by mouth daily.      . predniSONE (DELTASONE) 10 MG tablet Take 5 mg by mouth daily.        Inpatient Medications:  . aspirin EC  81 mg Oral Daily  . B-complex with vitamin C  1 tablet Oral Daily  . heparin  5,000 Units Subcutaneous Q8H  . [START ON 12/18/2012] influenza vac split quadrivalent PF  0.5 mL Intramuscular Tomorrow-1000  . latanoprost  1 drop Both Eyes QHS  . levothyroxine  100 mcg Oral QAC breakfast  . pantoprazole  40 mg Oral QAC breakfast  . predniSONE  5 mg Oral Q breakfast    Allergies: No Known Allergies  History   Social History  .  Marital Status: Widowed    Spouse Name: N/A    Number of Children: N/A  . Years of Education: N/A   Occupational History  . Not on file.   Social History Main Topics  . Smoking status: Never Smoker   . Smokeless tobacco: Not on file  . Alcohol Use: No  . Drug Use: No  . Sexual Activity: Not on file   Other Topics Concern  . Not on file   Social History Narrative  . No narrative on file     History reviewed. No pertinent family history.   Physical Exam: Filed Vitals:   12/17/12 1459 12/17/12 1500 12/17/12 2031 12/18/12 0602  BP: 120/70 125/58 149/55 143/69  Pulse:   76 40  Temp: 97.9 F (36.6 C) 97.9 F  (36.6 C) 97.3 F (36.3 C) 98 F (36.7 C)  TempSrc: Oral Oral Oral Oral  Resp: 20 24 20 18   Height: 5\' 7"  (1.702 m)     Weight: 180 lb 4.8 oz (81.784 kg)   178 lb 12.7 oz (81.1 kg)  SpO2: 97% 97% 97% 100%    GEN- The patient is well appearing, alert and oriented x 3 today.   Head- normocephalic, atraumatic Eyes-  Sclera clear, conjunctiva pink Ears- hearing intact Oropharynx- clear Neck- supple Lungs- Clear to ausculation bilaterally, normal work of breathing Heart- Regularly irregular rhythm, no murmurs, rubs or gallops, PMI not laterally displaced GI- soft, NT, ND, + BS Extremities- no clubbing, cyanosis, or edema MS- no significant deformity or atrophy Skin- no rash or lesion Psych- euthymic mood, full affect Neuro- strength and sensation are intact  Labs:   Lab Results  Component Value Date   WBC 12.2* 12/17/2012   HGB 13.7 12/17/2012   HCT 39.2 12/17/2012   MCV 99.7 12/17/2012   PLT 177 12/17/2012     Recent Labs Lab 12/16/12 1707 12/17/12 0232  NA 144  --   K 4.5  --   CL 113*  --   BUN 61*  --   CREATININE 2.00* 1.67*  GLUCOSE 114*  --     Radiology/Studies: Dg Chest Portable 1 View 12/16/2012   CLINICAL DATA:  Shortness of breath, bradycardia, ex-smoker  EXAM: PORTABLE CHEST - 1 VIEW  COMPARISON:  CT chest 06/13/2007 and chest radiograph 12/08/2004.  FINDINGS: Trachea is midline. Heart size stable. There is scarring in the right mid and right lower lung zones. Lungs are otherwise clear. No pleural fluid.  IMPRESSION: No acute findings.   Electronically Signed   By: Leanna Battles M.D.   On: 12/16/2012 17:30   Echo 2012 - normal EF, mild concentric LVH, mild-mod MR  EKG:  Sinus rhythm with PVCs in bigeminy, PVCs are of a LBBB inferior axis, likely arising from the LVOT  TELEMETRY: sinus rhythm with ventricular bigeminy  A/P 1. PVCs The patient has frequent PVCs (bigeminy) with symptoms of SOB.  His symptoms are modest at best and are stable.  He has failed  medical therapy with metoprolol and diltiazem.  He has no known structural heart disease or CAD. Therapeutic strategies for PVCs including medicine and ablation were discussed in detail with the patient today. The patient and his family are clear that they do not want invasive therapies.  I think that this is very reasonably given his advanced age. At this time, I would favor initiation of verapamil 120mg  daily.  If this does not improve his PVCs then consider addition of flecainide 50mg  BID at that time.  Presently, he does not have an indication for pacing and I do not think that a pacemaker would improve symptoms or reduce PVC burden. Transfer to telemetry.  I will see the patient as needed going forward. Please call with questions.

## 2012-12-17 NOTE — Progress Notes (Signed)
Report given to receiving RN. Patient is stable, with no complaints.  

## 2012-12-17 NOTE — Care Management Note (Signed)
    Page 1 of 1   12/17/2012     11:16:16 AM   CARE MANAGEMENT NOTE 12/17/2012  Patient:  Jordan [name], Jordan [name]   Account Number:  [id number]  Date Initiated:  12/17/2012  Documentation initiated by:  Junius Creamer  Subjective/Objective Assessment:   adm w bradycardia     Action/Plan:   lives w fam, pcp dr Andrey Campanile elkins   Anticipated DC Date:     Anticipated DC Plan:        DC Planning Services  CM consult      Choice offered to / List presented to:             Status of service:   Medicare Important Message given?   (If response is "NO", the following Medicare IM given date fields will be blank) Date Medicare IM given:   Date Additional Medicare IM given:    Discharge Disposition:    Per UR Regulation:  Reviewed for med. necessity/level of care/duration of stay  If discussed at Long Length of Stay Meetings, dates discussed:    Comments:

## 2012-12-18 DIAGNOSIS — I4891 Unspecified atrial fibrillation: Secondary | ICD-10-CM | POA: Diagnosis present

## 2012-12-18 MED ORDER — APIXABAN 2.5 MG PO TABS
2.5000 mg | ORAL_TABLET | Freq: Two times a day (BID) | ORAL | Status: DC
  Administered 2012-12-18 – 2012-12-20 (×5): 2.5 mg via ORAL
  Filled 2012-12-18 (×6): qty 1

## 2012-12-18 MED ORDER — FLECAINIDE ACETATE 50 MG PO TABS
50.0000 mg | ORAL_TABLET | Freq: Two times a day (BID) | ORAL | Status: DC
  Administered 2012-12-18 – 2012-12-20 (×5): 50 mg via ORAL
  Filled 2012-12-18 (×6): qty 1

## 2012-12-18 NOTE — Progress Notes (Signed)
Co-signed for LaTisha Teasley RN/BSN for assessments, IV assessments, vital signs, medication administration, I's & O's, care plans, patient education, and progress notes. Orie Baxendale M, RN/BSN 

## 2012-12-18 NOTE — Progress Notes (Signed)
Patient's HR increased to the 150s sustaining. Patient was symptomatic at the time, with no chest discomfort or pain. Patient observed having increased work of breathing. Patient stated that he can feel his heart beating fast. Temp. 98.0; Pulse-130; Resp. 24; BP-130/81, O2 Sats 98% on 2L [place]. STAT EKG obtained. MD made aware. 1000 pm scheduled dose of PO Tambocor administered early. Per MD orders. Will notify receiving RN. Will continue to monitor patient for further changes in condition.

## 2012-12-18 NOTE — Progress Notes (Signed)
    Subjective:  Overall he is sitting up in chair, feeling quite well. No adverse symptoms. Earlier this morning when he was in atrial fibrillation, he was asymptomatic with this. His breathing is quite well.  Objective:  Vital Signs in the last 24 hours: Temp:  [97.3 F (36.3 C)-98.1 F (36.7 C)] 98.1 F (36.7 C) (10/08 1300) Pulse Rate:  [40-76] 43 (10/08 1300) Resp:  [18-20] 18 (10/08 1300) BP: (117-149)/(51-69) 121/53 mmHg (10/08 1300) SpO2:  [97 %-100 %] 98 % (10/08 1300) Weight:  [178 lb 12.7 oz (81.1 kg)] 178 lb 12.7 oz (81.1 kg) (10/08 0602)  Intake/Output from previous day: 10/07 0701 - 10/08 0700 In: 240 [P.O.:240] Out: 825 [Urine:825]   Physical Exam: General: Elderly, Well developed, well nourished, in no acute distress.  Head: Normocephalic and atraumatic.  Lungs: Distant breath sounds bilaterally, no active wheezing, mildly increased respiratory effort.  Heart: Ventricular bigeminy pattern, frequent ectopy. Distant heart sounds, soft systolic murmur left lower sternal border.  Abdomen: soft, non-tender, positive bowel sounds.  Extremities: No clubbing or cyanosis. No edema.  Neurologic: Alert and oriented x 3.     Lab Results:  Recent Labs  12/16/12 1600 12/16/12 1707 12/17/12 0232  WBC 11.0*  --  12.2*  HGB 15.7 14.3 13.7  PLT 204  --  177    Recent Labs  12/16/12 1707 12/17/12 0232  NA 144  --   K 4.5  --   CL 113*  --   GLUCOSE 114*  --   BUN 61*  --   CREATININE 2.00* 1.67*    Imaging: Dg Chest Portable 1 View  12/16/2012   CLINICAL DATA:  Shortness of breath, bradycardia, ex-smoker  EXAM: PORTABLE CHEST - 1 VIEW  COMPARISON:  CT chest 06/13/2007 and chest radiograph 12/08/2004.  FINDINGS: Trachea is midline. Heart size stable. There is scarring in the right mid and right lower lung zones. Lungs are otherwise clear. No pleural fluid.  IMPRESSION: No acute findings.   Electronically Signed   By: Leanna Battles M.D.   On: 12/16/2012 17:30     Personally viewed.   Telemetry: Atrial fibrillation with rapid ventricular response for approximately 2 hours this morning, otherwise mostly ventricular bigeminy or frequent PVCs. No significant bradycardia. Personally viewed.   Assessment/Plan:  Active Problems:   Bradycardia   Hypertension   Hypothyroidism   Acute kidney failure  79 year old male with ventricular bigeminy, newly discovered atrial fibrillation with rapid ventricular response, shortness of breath, COPD  1. Atrial fibrillation with rapid ventricular response-paroxysmal, currently in sinus rhythm with intraocular bigeminy. I discussed case with Dr. Johney Frame. We will continue with verapamil and I will go ahead and add flecainide 50 mg twice a day. We will continue to observe him given his advanced age and monitor for any signs of AV delay. Baseline PR interval appears normal on EKG. I have discussed with he and his granddaughter.  2. Chronic anticoagulation-because of the atrial fibrillation and high risk of stroke, I decided to initiate low-dose Eliquis 2.5mg  BID  3. COPD-currently appears stable.  He is quite eager to go home however I explained to him that he must be observed on telemetry at least overnight while taking his first 2 doses of flecainide. If no adverse arrhythmias are noted, it would not be unreasonable to allow him to be discharged tomorrow with close transitional care followup.   [name], Jordan 12/18/2012, 3:54 PM

## 2012-12-18 NOTE — Progress Notes (Signed)
Patient became Short of Breath and stated his Heart was beating fast, STAT EKG was done, patient given 2 L of oxygen and given medication ordered by MD. Patient stated he was feeling better with the oxygen. Patient now resting comfortably and no further complaints.

## 2012-12-18 NOTE — Progress Notes (Signed)
Chart review complete.  Patient is not eligible for [facility] Care Management services because his/her PCP is not a [facility] primary care provider or is not [facility] affiliated.  For any additional questions or new referrals please contact Tim Henderson BSN RN [facility] Liaison at [phone] °

## 2012-12-19 DIAGNOSIS — I4891 Unspecified atrial fibrillation: Principal | ICD-10-CM

## 2012-12-19 NOTE — Progress Notes (Signed)
Patient Name: Jordan [name] Date of Encounter: 12/19/2012    SUBJECTIVE: Complained of dyspnea this AM and was also in A Fib with RVR. He feels better now on oxygen therapy. He denies chest pain. Daughter not available to discuss discharge. He wants to go home.   TELEMETRY:  A fib with RVR until 9:30 this AM then NSR with PVC's: Filed Vitals:   12/18/12 1830 12/18/12 2014 12/19/12 0503 12/19/12 0904  BP: 130/81 146/52 125/67 107/73  Pulse: 130 73 72 86  Temp: 98 F (36.7 C) 98.1 F (36.7 C) 97.8 F (36.6 C) 97.1 F (36.2 C)  TempSrc: Oral Oral Oral Oral  Resp: 24 21 20 20   Height:      Weight:   170 lb 1.6 oz (77.157 kg)   SpO2: 98% 99% 100% 95%    Intake/Output Summary (Last 24 hours) at 12/19/12 1311 Last data filed at 12/19/12 0903  Gross per 24 hour  Intake    600 ml  Output   1650 ml  Net  -1050 ml    LABS: Basic Metabolic Panel:  Recent Labs  11/91/47 1707 12/17/12 0232  NA 144  --   K 4.5  --   CL 113*  --   GLUCOSE 114*  --   BUN 61*  --   CREATININE 2.00* 1.67*  MG  --  1.5   CBC:  Recent Labs  12/16/12 1600 12/16/12 1707 12/17/12 0232  WBC 11.0*  --  12.2*  HGB 15.7 14.3 13.7  HCT 44.3 42.0 39.2  MCV 100.5*  --  99.7  PLT 204  --  177   BNP No results found for this basename: probnp    Radiology/Studies:  No new data. No acute abnormality on 12/16/12  Physical Exam: Blood pressure 107/73, pulse 86, temperature 97.1 F (36.2 C), temperature source Oral, resp. rate 20, height 5\' 7"  (1.702 m), weight 170 lb 1.6 oz (77.157 kg), SpO2 95.00%. Weight change: -10 lb 3.2 oz (-4.627 kg)   Very distant breath sounds. Irregular heart rhythm No edema.  ASSESSMENT:  1. PAF now reverted to NSR with PVC's 2. Dyspnea, possibly due to heart failure 3. PVC's   Plan:  1. Continue flecainide 2. Try to speak with daughter. 3. Check BNP Signed, Lesleigh Noe 12/19/2012, 1:11 PM

## 2012-12-20 LAB — BASIC METABOLIC PANEL
BUN: 32 mg/dL — ABNORMAL HIGH (ref 6–23)
CO2: 20 mEq/L (ref 19–32)
Calcium: 8.7 mg/dL (ref 8.4–10.5)
Creatinine, Ser: 1.47 mg/dL — ABNORMAL HIGH (ref 0.50–1.35)
GFR calc Af Amer: 46 mL/min — ABNORMAL LOW (ref >90)
GFR calc non Af Amer: 40 mL/min — ABNORMAL LOW (ref >90)
Glucose, Bld: 182 mg/dL — ABNORMAL HIGH (ref 70–99)
Sodium: 135 mEq/L (ref 135–145)

## 2012-12-20 MED ORDER — FLECAINIDE ACETATE 50 MG PO TABS: 50.0000 mg | ORAL_TABLET | Freq: Two times a day (BID) | ORAL | Status: DC

## 2012-12-20 MED ORDER — ATENOLOL 25 MG PO TABS: 25.0000 mg | ORAL_TABLET | Freq: Every day | ORAL | Status: DC

## 2012-12-20 MED ORDER — ATENOLOL 25 MG PO TABS
25.0000 mg | ORAL_TABLET | Freq: Every day | ORAL | Status: DC
  Administered 2012-12-20: 15:00:00 25 mg via ORAL
  Filled 2012-12-20: qty 1

## 2012-12-20 MED ORDER — APIXABAN 2.5 MG PO TABS: 2.5000 mg | ORAL_TABLET | Freq: Two times a day (BID) | ORAL | Status: DC

## 2012-12-20 MED ORDER — FUROSEMIDE 20 MG PO TABS: 20.0000 mg | ORAL_TABLET | Freq: Every day | ORAL | Status: DC | PRN

## 2012-12-20 MED ORDER — METOPROLOL TARTRATE 25 MG PO TABS: 25.0000 mg | ORAL_TABLET | Freq: Two times a day (BID) | ORAL | Status: DC

## 2012-12-20 NOTE — Progress Notes (Signed)
Patient Name: Jordan [name] Date of Encounter: 12/20/2012    SUBJECTIVE: Spoke to patient and daughter. His dyspnea is much better. He converted to normal sinus rhythm at 5 AM. There is also some atrial flutter noted. His beta blocker was stopped due to a perceived low heart rate. It appears this was due to PVCs prior to admission which caused underestimation of his real heart rate. He wants to go home.   TELEMETRY:  A fib with RVR until 9:30 this AM then NSR with PVC's: Filed Vitals:   12/19/12 1530 12/19/12 2100 12/20/12 0534 12/20/12 1001  BP: 112/48 148/56 129/73 127/55  Pulse: 76 78 119 76  Temp: 97.6 F (36.4 C) 97.8 F (36.6 C) 98.2 F (36.8 C) 98.1 F (36.7 C)  TempSrc: Oral Oral Oral Oral  Resp: 18 20 20 20   Height:      Weight:   178 lb 6.4 oz (80.922 kg)   SpO2: 95% 97% 96% 96%    Intake/Output Summary (Last 24 hours) at 12/20/12 1333 Last data filed at 12/20/12 0856  Gross per 24 hour  Intake    720 ml  Output   1051 ml  Net   -331 ml    LABS: Basic Metabolic Panel: No results found for this basename: NA, K, CL, CO2, GLUCOSE, BUN, CREATININE, CALCIUM, MG, PHOS,  in the last 72 hours CBC: No results found for this basename: WBC, NEUTROABS, HGB, HCT, MCV, PLT,  in the last 72 hours BNP    Component Value Date/Time   PROBNP 669.1* 12/19/2012 1615    Radiology/Studies:  No new data. No acute abnormality on 12/16/12  Physical Exam: Blood pressure 127/55, pulse 76, temperature 98.1 F (36.7 C), temperature source Oral, resp. rate 20, height 5\' 7"  (1.702 m), weight 178 lb 6.4 oz (80.922 kg), SpO2 96.00%. Weight change: 8 lb 4.8 oz (3.765 kg)   Very distant breath sounds. Regular rate and rhythm, S1, S2 Obese, Trace ankle edema bilaterally.  ASSESSMENT:  1. PAF now reverted to NSR with PVC's 2. Dyspnea, possibly due to heart failure-mildly elevated BNP. He does use a diuretic at home when his ankles get swollen. He really wants to go home today. He feels  that his breathing is better. He walked to the bathroom without difficulty today. 3. PVC's   Plan:  1. Continue flecainide. Add back home dose of atenolol to hopefully prevent atrial flutter which can be associated with initiation of flecainide. 2. spoke with daughter.  She agrees that he is back to his baseline. We discussed diuretic. He would like to take this tomorrow morning rather than now to avoid having to go to the bathroom at night. His breathing feels fine off of oxygen. 3. Check BNP-mildly elevated. We'll have him take Lasix 20 mg a day for a few days at home. Will check Bmet to see if his renal function has continued to improve. Possible discharge later today. Signed, Lance Muss S. 12/20/2012, 1:33 PM

## 2012-12-20 NOTE — Progress Notes (Signed)
Utilization Review Completed.   Calleigh Lafontant, RN, BSN Nurse Case Manager  [phone]  

## 2012-12-20 NOTE — Progress Notes (Signed)
[name] to be D/C'd Home per MD order.  Discussed with the patient and all questions fully answered.    Medication List         acetaminophen 500 MG tablet  Commonly known as:  TYLENOL  Take 1,000 mg by mouth 3 (three) times daily.     albuterol 108 (90 BASE) MCG/ACT inhaler  Commonly known as:  PROVENTIL HFA;VENTOLIN HFA  Inhale 2 puffs into the lungs every 6 (six) hours as needed for wheezing.     apixaban 2.5 MG Tabs tablet  Commonly known as:  ELIQUIS  Take 1 tablet (2.5 mg total) by mouth 2 (two) times daily.     atenolol 25 MG tablet  Commonly known as:  TENORMIN  Take 1 tablet (25 mg total) by mouth daily.     B-complex with vitamin C tablet  Take 1 tablet by mouth daily.     flecainide 50 MG tablet  Commonly known as:  TAMBOCOR  Take 1 tablet (50 mg total) by mouth every 12 (twelve) hours.     furosemide 20 MG tablet  Commonly known as:  LASIX  Take 1 tablet (20 mg total) by mouth daily as needed for fluid.     latanoprost 0.005 % ophthalmic solution  Commonly known as:  XALATAN  Place 1 drop into both eyes at bedtime.     levothyroxine 100 MCG tablet  Commonly known as:  SYNTHROID, LEVOTHROID  Take 100 mcg by mouth daily before breakfast.     omeprazole 20 MG capsule  Commonly known as:  PRILOSEC  Take 20 mg by mouth daily.     predniSONE 10 MG tablet  Commonly known as:  DELTASONE  Take 5 mg by mouth daily.        VVS, Skin clean, dry and intact without evidence of skin break down, no evidence of skin tears noted. IV catheter discontinued intact. Site without signs and symptoms of complications. Dressing and pressure applied.  An After Visit Summary was printed and given to the patient. Patient escorted via WC, and D/C home via private auto.  Jordan [name] 12/20/2012 6:19 PM

## 2012-12-20 NOTE — Discharge Summary (Signed)
Patient ID: Jordan [name] MRN: [id number] DOB/AGE: May 25, 1918 79 y.o.  Admit date: 12/16/2012 Discharge date: 12/20/2012  Primary Discharge Diagnosis Atrial fibrillation Secondary Discharge Diagnosis PVCs, diastolic heart failure, SHOB  Significant Diagnostic Studies: none  Consults: None  Hospital Course: 79 y/o who was admitted from her PMD for bradycardia.  He was found to have an elevated creatinine and was thought to be dehydrated.  He was hydrated. His creatinine came back to baseline. He was initially found to be in sinus rhythm with PVCs. This likely was the reason why he was thought to be bradycardic at his primary care physician's office. He then developed atrial fibrillation with rapid ventricular response. His atenolol had been stopped due to the dehydration and mild hypotension as well as concern for bradycardia. He was started on Eliquis and flecainide. He converted back and forth between sinus rhythm, atrial fibrillation and atrial flutter while the flecainide was started. On the day of discharge, his home dose of atenolol was added back. He did have some ankle edema. His shortness of breath however was much improved. Prior records showed his ejection fraction was normal in 2012. He had been using Lasix when necessary for leg swelling. He felt much better and wanted to go home.  I've asked him to take Lasix 20 mg daily on Saturday and Sunday to help with possible fluid overload. His BNP was mildly elevated. He did not want to take a dose on Friday symptoms are related in the day. He will get a phone call him Monday to check on him and he will followup within a week with Dr. Anne Fu, on Wednesday.   Discharge Exam: Blood pressure 118/78, pulse 80, temperature 98.3 F (36.8 C), temperature source Oral, resp. rate 20, height 5\' 7"  (1.702 m), weight 178 lb 6.4 oz (80.922 kg), SpO2 97.00%.  [place]/AT RRR, S1S2 No wheezing Obese, nondistended Trace pretibial edema  bilaterally  Labs:   Lab Results  Component Value Date   WBC 12.2* 12/17/2012   HGB 13.7 12/17/2012   HCT 39.2 12/17/2012   MCV 99.7 12/17/2012   PLT 177 12/17/2012    Recent Labs Lab 12/20/12 1539  NA 135  K 4.7  CL 102  CO2 20  BUN 32*  CREATININE 1.47*  CALCIUM 8.7  GLUCOSE 182*   No results found for this basename: CKTOTAL, CKMB, CKMBINDEX, TROPONINI    No results found for this basename: CHOL   No results found for this basename: HDL   No results found for this basename: LDLCALC   No results found for this basename: TRIG   No results found for this basename: CHOLHDL   No results found for this basename: LDLDIRECT      Radiology: No evidence of pneumonia or pulmonary vascular congestion EKG: Normal sinus rhythm with PVCs  FOLLOW UP PLANS AND APPOINTMENTS  Future Appointments Provider Department Dept Phone   01/01/2013 2:30 PM Donato Schultz, MD [facility] [phone]   09/11/2013 2:00 PM Donato Schultz, MD [facility] [phone]       Medication List         acetaminophen 500 MG tablet  Commonly known as:  TYLENOL  Take 1,000 mg by mouth 3 (three) times daily.     albuterol 108 (90 BASE) MCG/ACT inhaler  Commonly known as:  PROVENTIL HFA;VENTOLIN HFA  Inhale 2 puffs into the lungs every 6 (six) hours as needed for wheezing.     apixaban 2.5 MG Tabs tablet  Commonly known as:  ELIQUIS  Take 1 tablet (2.5 mg total) by mouth 2 (two) times daily.     atenolol 25 MG tablet  Commonly known as:  TENORMIN  Take 1 tablet (25 mg total) by mouth daily.     B-complex with vitamin C tablet  Take 1 tablet by mouth daily.     flecainide 50 MG tablet  Commonly known as:  TAMBOCOR  Take 1 tablet (50 mg total) by mouth every 12 (twelve) hours.     furosemide 20 MG tablet  Commonly known as:  LASIX  Take 1 tablet (20 mg total) by mouth daily as needed for fluid.     latanoprost 0.005 % ophthalmic solution  Commonly known as:   XALATAN  Place 1 drop into both eyes at bedtime.     levothyroxine 100 MCG tablet  Commonly known as:  SYNTHROID, LEVOTHROID  Take 100 mcg by mouth daily before breakfast.     omeprazole 20 MG capsule  Commonly known as:  PRILOSEC  Take 20 mg by mouth daily.     predniSONE 10 MG tablet  Commonly known as:  DELTASONE  Take 5 mg by mouth daily.           Follow-up Information   Follow up with Donato Schultz, MD On 01/01/2013. (2:30 PM)    Specialty:  Cardiology   Contact information:   [address] [place] [address] [phone]       BRING ALL MEDICATIONS WITH YOU TO FOLLOW UP APPOINTMENTS  Time spent with patient to include physician time:  40 minutes discussing new meds including Eliquis and coordinating followup care. SignedCorky Crafts. 12/20/2012, 4:40 PM

## 2012-12-23 ENCOUNTER — Telehealth: Payer: Self-pay | Admitting: Cardiology

## 2012-12-23 NOTE — Telephone Encounter (Signed)
New message   Discharged from hosp fri--daughter thinks he is to be seen this wed instead of next wed/

## 2012-12-27 NOTE — Telephone Encounter (Signed)
Confirmed with patient that appointment is on 01/01/2013 @ 2:30 pm.

## 2012-12-31 ENCOUNTER — Encounter: Payer: Self-pay | Admitting: Cardiology

## 2013-01-01 ENCOUNTER — Encounter: Payer: Self-pay | Admitting: Cardiology

## 2013-01-01 ENCOUNTER — Other Ambulatory Visit: Payer: Self-pay | Admitting: Pharmacist

## 2013-01-01 ENCOUNTER — Ambulatory Visit (INDEPENDENT_AMBULATORY_CARE_PROVIDER_SITE_OTHER): Payer: Medicare Other | Admitting: Pharmacist

## 2013-01-01 ENCOUNTER — Ambulatory Visit (INDEPENDENT_AMBULATORY_CARE_PROVIDER_SITE_OTHER): Payer: Medicare Other | Admitting: Cardiology

## 2013-01-01 VITALS — BP 103/58 | HR 64 | Ht 68.0 in | Wt 184.0 lb

## 2013-01-01 DIAGNOSIS — I5032 Chronic diastolic (congestive) heart failure: Secondary | ICD-10-CM

## 2013-01-01 DIAGNOSIS — I4891 Unspecified atrial fibrillation: Secondary | ICD-10-CM

## 2013-01-01 DIAGNOSIS — J441 Chronic obstructive pulmonary disease with (acute) exacerbation: Secondary | ICD-10-CM | POA: Insufficient documentation

## 2013-01-01 DIAGNOSIS — R197 Diarrhea, unspecified: Secondary | ICD-10-CM

## 2013-01-01 DIAGNOSIS — Z7901 Long term (current) use of anticoagulants: Secondary | ICD-10-CM

## 2013-01-01 DIAGNOSIS — J449 Chronic obstructive pulmonary disease, unspecified: Secondary | ICD-10-CM

## 2013-01-01 MED ORDER — WARFARIN SODIUM 2.5 MG PO TABS: 2.5000 mg | ORAL_TABLET | Freq: Every day | ORAL | Status: DC

## 2013-01-01 NOTE — Progress Notes (Signed)
[address] [place], [place]  [address] Phone: [phone] Fax:  [phone]  Date:  01/01/2013   ID:  SEVEN DOLLENS, DOB [birth year], MRN [id number]  PCP:  Kaleen Mask, MD   History of Present Illness: Jordan [name] is a 79 y.o. male with newly discovered paroxysmal atrial fibrillation with hospitalization on 12/16/12 with diastolic heart failure, very frequent PVCs, ventricular bigeminy.  PVCs 28,000 and on 24-hour Holter monitor 8/12, normal ejection fraction with chronic dyspnea, cough, wheeze.   He has battled off and on over the last several years with chronic diarrhea. This is been a problem and may be linked to milk his wife thinks. This is not started since initiation of new medications. On admission to the hospital in October of 2014, his creatinine was actually elevated signifying possible intravascular volume depletion. He was also seen in consultation by Dr. Johney Frame of electrophysiology who recommended low-dose flecainide to see if this will help with his paroxysmal atrial fibrillation/PVC burden.  His anticoagulation cost is nearly $300. Difficult to afford. He is not having any bleeding issues.   He still feeling short of breath at times. Some of his breathing issues chronic and associated wheezing. I think his COPD is likely playing a role as well. Continue with inhalers.   Wt Readings from Last 3 Encounters:  01/01/13 184 lb (83.462 kg)  12/20/12 178 lb 6.4 oz (80.922 kg)     Past Medical History  Diagnosis Date  . GERD (gastroesophageal reflux disease)   . Hypertension   . COPD (chronic obstructive pulmonary disease)   . Chronic kidney disease   . Ventricular bigeminy   . PVC (premature ventricular contraction)   . Dyspnea     Past Surgical History  Procedure Laterality Date  . Cholecystectomy    . Splenectomy, partial      Current Outpatient Prescriptions  Medication Sig Dispense Refill  . Acetaminophen (TYLENOL EXTRA  STRENGTH PO) Take 1 tablet by mouth as needed.      Marland Kitchen albuterol (PROVENTIL HFA;VENTOLIN HFA) 108 (90 BASE) MCG/ACT inhaler Inhale 2 puffs into the lungs every 6 (six) hours as needed for wheezing.      Marland Kitchen apixaban (ELIQUIS) 2.5 MG TABS tablet Take 2.5 mg by mouth 2 (two) times daily.      Marland Kitchen atenolol (TENORMIN) 25 MG tablet Take 1 tablet (25 mg total) by mouth daily.  30 tablet  11  . flecainide (TAMBOCOR) 50 MG tablet Take 50 mg by mouth 2 (two) times daily.      . furosemide (LASIX) 20 MG tablet Take 40 mg by mouth as needed.       Marland Kitchen levothyroxine (SYNTHROID, LEVOTHROID) 100 MCG tablet Take 50 mcg by mouth daily before breakfast.       . omeprazole (PRILOSEC) 20 MG capsule Take 20 mg by mouth daily.       No current facility-administered medications for this visit.    Allergies:    Allergies  Allergen Reactions  . Diltiazem Hcl Er Other (See Comments)    Tachycardia  . Metoprolol Succinate Er [Metoprolol Tartrate] Other (See Comments)    hallucinations    Social History:  The patient  reports that he has never smoked. He does not have any smokeless tobacco history on file. He reports that he does not drink alcohol or use illicit drugs.   ROS:  Please see the history of present illness.   Chronic diarrhea. Positive for dyspnea.  No chest pain. No syncope. Fatigue. No bleeding.   All other systems reviewed and negative.   PHYSICAL EXAM: VS:  BP 103/58  Pulse 64  Ht 5\' 8"  (1.727 m)  Wt 184 lb (83.462 kg)  BMI 27.98 kg/m2 Elderly Well nourished, well developed, in no acute distress HEENT: normal Neck: no JVD Cardiac:  normal S1, S2; RRR; rare ectopy, no murmur Lungs:  clear to auscultation bilaterally, occasional scattered wheezing, no rhonchi or rales Abd: soft, nontender, no hepatomegaly Ext: Minor ankle edemaBilaterally Skin: warm and dry Neuro: no focal abnormalities noted  EKG:  As recent hospital EKG with sinus rhythm and PVCs. No further atrial fibrillation.      ASSESSMENT AND PLAN:  1. Paroxysmal atrial fibrillation-currently on anticoagulation. Cost is an issue. He will be discussing with pharmacy. On antiarrhythmic flecainide 50 twice a day. Low-dose. No prior documented history of CAD. No symptoms of syncope currently. Heart rate sounds improved on auscultation today. 2. Chronic anticoagulation-discussing with pharmacy on cost options. 3. Chronic diastolic heart failure-likely playing a role in his symptoms however when he first arrived to the hospital he appear to be intravascular volume depleted with an increased creatinine. He has had some diarrhea recently. I do not wish to over diurese him with Lasix. Continue with current treatment strategy. 4. COPD-continue with inhalers. This also can be playing a role. Wheezing appreciated on exam at times. He smoked for 50 years. 5. Diarrhea-continue to address with Dr. Jeannetta Nap. I agree with stopping milk products. This has not had a temporal association with any new medications. His wife assures me.  Signed, Donato Schultz, MD [facility]  01/01/2013 2:54 PM

## 2013-01-01 NOTE — Patient Instructions (Signed)
Your physician recommends that you continue on your current medications as directed. Please refer to the Current Medication list given to you today.  Patient has been referred to Pharmacy.  Your physician wants you to follow-up in: 4 months with Dr. Algis Liming will receive a reminder letter in the mail two months in advance. If you don't receive a letter, please call our office to schedule the follow-up appointment.

## 2013-01-06 ENCOUNTER — Ambulatory Visit (INDEPENDENT_AMBULATORY_CARE_PROVIDER_SITE_OTHER): Payer: Medicare Other | Admitting: Pharmacist

## 2013-01-06 DIAGNOSIS — I4891 Unspecified atrial fibrillation: Secondary | ICD-10-CM

## 2013-01-10 ENCOUNTER — Ambulatory Visit (INDEPENDENT_AMBULATORY_CARE_PROVIDER_SITE_OTHER): Payer: Medicare Other | Admitting: Pharmacist

## 2013-01-10 DIAGNOSIS — I4891 Unspecified atrial fibrillation: Secondary | ICD-10-CM

## 2013-01-10 LAB — POCT INR: INR: 2

## 2013-01-17 ENCOUNTER — Ambulatory Visit (INDEPENDENT_AMBULATORY_CARE_PROVIDER_SITE_OTHER): Payer: Medicare Other | Admitting: Pharmacist

## 2013-01-17 DIAGNOSIS — I4891 Unspecified atrial fibrillation: Secondary | ICD-10-CM

## 2013-01-17 MED ORDER — WARFARIN SODIUM 2.5 MG PO TABS: ORAL_TABLET | ORAL | Status: DC

## 2013-01-28 ENCOUNTER — Telehealth: Payer: Self-pay | Admitting: Cardiology

## 2013-01-28 NOTE — Telephone Encounter (Signed)
New Problem   Pt daughter wanted to know if the Warfarin caused bad dreams// please call back to discuss.

## 2013-01-28 NOTE — Telephone Encounter (Signed)
Daughter instructed that bad dreams should have nothing to do with his warfarin based on reported adverse effects of this medication.  She was told it was unlikely due to flecainide either.

## 2013-01-31 ENCOUNTER — Ambulatory Visit (INDEPENDENT_AMBULATORY_CARE_PROVIDER_SITE_OTHER): Payer: Medicare Other | Admitting: Pharmacist

## 2013-01-31 DIAGNOSIS — I4891 Unspecified atrial fibrillation: Secondary | ICD-10-CM

## 2013-02-21 ENCOUNTER — Ambulatory Visit (INDEPENDENT_AMBULATORY_CARE_PROVIDER_SITE_OTHER): Payer: Medicare Other | Admitting: Pharmacist

## 2013-02-21 DIAGNOSIS — I4891 Unspecified atrial fibrillation: Secondary | ICD-10-CM

## 2013-03-21 ENCOUNTER — Ambulatory Visit (INDEPENDENT_AMBULATORY_CARE_PROVIDER_SITE_OTHER): Payer: Medicare Other | Admitting: Pharmacist

## 2013-03-21 DIAGNOSIS — I4891 Unspecified atrial fibrillation: Secondary | ICD-10-CM

## 2013-03-21 LAB — POCT INR: INR: 2.2

## 2013-04-18 ENCOUNTER — Ambulatory Visit (INDEPENDENT_AMBULATORY_CARE_PROVIDER_SITE_OTHER): Payer: Medicare Other | Admitting: Pharmacist

## 2013-04-18 ENCOUNTER — Ambulatory Visit (INDEPENDENT_AMBULATORY_CARE_PROVIDER_SITE_OTHER): Payer: Medicare Other | Admitting: Cardiology

## 2013-04-18 ENCOUNTER — Encounter: Payer: Self-pay | Admitting: Cardiology

## 2013-04-18 VITALS — BP 149/59 | HR 55 | Ht 68.0 in | Wt 187.0 lb

## 2013-04-18 DIAGNOSIS — J4489 Other specified chronic obstructive pulmonary disease: Secondary | ICD-10-CM

## 2013-04-18 DIAGNOSIS — J449 Chronic obstructive pulmonary disease, unspecified: Secondary | ICD-10-CM

## 2013-04-18 DIAGNOSIS — I5032 Chronic diastolic (congestive) heart failure: Secondary | ICD-10-CM

## 2013-04-18 DIAGNOSIS — Z7901 Long term (current) use of anticoagulants: Secondary | ICD-10-CM

## 2013-04-18 DIAGNOSIS — I4891 Unspecified atrial fibrillation: Secondary | ICD-10-CM

## 2013-04-18 DIAGNOSIS — Z5181 Encounter for therapeutic drug level monitoring: Secondary | ICD-10-CM

## 2013-04-18 LAB — POCT INR: INR: 2.3

## 2013-04-18 MED ORDER — FLECAINIDE ACETATE 100 MG PO TABS: 100.0000 mg | ORAL_TABLET | Freq: Every day | ORAL | Status: DC

## 2013-04-18 NOTE — Patient Instructions (Signed)
Your physician recommends that you continue on your current medications as directed. Please refer to the Current Medication list given to you today.  Your physician wants you to follow-up in: 6 months with Dr. [name]. You will receive a reminder letter in the mail two months in advance. If you don't receive a letter, please call our office to schedule the follow-up appointment.  

## 2013-04-18 NOTE — Progress Notes (Signed)
[address] [place], [place]  [address] Phone: [phone] Fax:  [phone]  Date:  04/18/2013   ID:  Jordan [name], DOB 1918-05-27, MRN [id number]  PCP:  Jordan MaskELKINS,[name], MD   History of Present Illness: Jordan BeatRalph B [name] is a 80 y.o. male with newly discovered paroxysmal atrial fibrillation with hospitalization on 12/16/12 with diastolic heart failure, very frequent PVCs, ventricular bigeminy.  PVCs 28,000 and on 24-hour Holter monitor 8/12, normal ejection fraction with chronic dyspnea, cough, wheeze.   He has battled off and on over the last several years with chronic diarrhea. This is been a problem and may be linked to milk his wife thinks. This is not started since initiation of new medications. On admission to the hospital in October of 2014, his creatinine was actually elevated signifying possible intravascular volume depletion. He was also seen in consultation by Dr. Johney [name] of electrophysiology who recommended low-dose flecainide to see if this will help with his paroxysmal atrial fibrillation/PVC burden.  His breathing is better. Some of his breathing issues chronic and associated wheezing. I think his COPD is likely playing a role as well. Continue with inhalers.   Wt Readings from Last 3 Encounters:  01/01/13 184 lb (83.462 kg)  12/20/12 178 lb 6.4 oz (80.922 kg)     Past Medical History  Diagnosis Date  . GERD (gastroesophageal reflux disease)   . Hypertension   . COPD (chronic obstructive pulmonary disease)   . Chronic kidney disease   . Ventricular bigeminy   . PVC (premature ventricular contraction)   . Dyspnea     Past Surgical History  Procedure Laterality Date  . Cholecystectomy    . Splenectomy, partial      Current Outpatient Prescriptions  Medication Sig Dispense Refill  . Acetaminophen (TYLENOL EXTRA STRENGTH PO) Take 1 tablet by mouth as needed.      Marland Kitchen. albuterol (PROVENTIL HFA;VENTOLIN HFA) 108 (90 BASE) MCG/ACT inhaler  Inhale 2 puffs into the lungs every 6 (six) hours as needed for wheezing.      Marland Kitchen. apixaban (ELIQUIS) 2.5 MG TABS tablet Take 2.5 mg by mouth 2 (two) times daily.      Marland Kitchen. atenolol (TENORMIN) 25 MG tablet Take 1 tablet (25 mg total) by mouth daily.  30 tablet  11  . flecainide (TAMBOCOR) 50 MG tablet Take 50 mg by mouth 2 (two) times daily.      . furosemide (LASIX) 20 MG tablet Take 40 mg by mouth as needed.       Marland Kitchen. levothyroxine (SYNTHROID, LEVOTHROID) 100 MCG tablet Take 50 mcg by mouth daily before breakfast.       . omeprazole (PRILOSEC) 20 MG capsule Take 20 mg by mouth daily.      Marland Kitchen. warfarin (COUMADIN) 2.5 MG tablet Take 1 tablet on all days except 1.5 tablets on Monday, Wednesday, and Friday; or as directed by [facility].  38 tablet  3   No current facility-administered medications for this visit.    Allergies:    Allergies  Allergen Reactions  . Diltiazem Hcl Er Other (See Comments)    Tachycardia  . Metoprolol Succinate Er [Metoprolol Tartrate] Other (See Comments)    hallucinations    Social History:  The patient  reports that he has never smoked. He does not have any smokeless tobacco history on file. He reports that he does not drink alcohol or use illicit drugs.   ROS:  Please see the  history of present illness.   Chronic diarrhea. Positive for dyspnea. No chest pain. No syncope. Fatigue. No bleeding.   All other systems reviewed and negative.   PHYSICAL EXAM: VS:  Ht 5\' 8"  (1.727 m) Elderly Well nourished, well developed, in no acute distress HEENT: normal Neck: no JVD Cardiac:  normal S1, S2; RRR; rare ectopy, no murmur Lungs:  clear to auscultation bilaterally, occasional scattered wheezing, no rhonchi or rales Abd: soft, nontender, no hepatomegaly Ext: Minor ankle edemaBilaterally Skin: warm and dry Neuro: no focal abnormalities noted  EKG:  As recent hospital EKG with sinus rhythm and PVCs. No further atrial fibrillation.     ASSESSMENT AND  PLAN:  1. Paroxysmal atrial fibrillation-currently on warfarin as anticoagulation. Cost is an issue. On antiarrhythmic flecainide 100 mg once a day(can split it half tablets twice daily)  Low-dose. No prior documented history of CAD. No symptoms of syncope currently.  2. Chronic anticoagulation-cost of Eliquis was too high. He is now back on warfarin. 3. Chronic diastolic heart failure-originally in hospital he appear to be intravascular volume depleted with an increased creatinine. Avoiding NSAIDs  I do not wish to over diurese him with Lasix. Continue with current treatment strategy. 4. COPD-continue with inhalers. This also can be playing a role. Wheezing appreciated on exam at times. He smoked for 50 years. 5. Diarrhea-continue to address with Dr. Jeannetta Nap. I agree with stopping milk products. This has not had a temporal association with any new medications. His wife assures me.  Signed, Donato Schultz, MD [facility]  04/18/2013 2:38 PM

## 2013-04-24 ENCOUNTER — Inpatient Hospital Stay (HOSPITAL_COMMUNITY)
Admission: EM | Admit: 2013-04-24 | Discharge: 2013-04-28 | DRG: 469 | Disposition: A | Discharge status: Skilled Nursing Facility | Payer: Medicare Other | Attending: Internal Medicine | Admitting: Internal Medicine

## 2013-04-24 ENCOUNTER — Emergency Department (HOSPITAL_COMMUNITY): Payer: Medicare Other

## 2013-04-24 ENCOUNTER — Encounter (HOSPITAL_COMMUNITY): Payer: Self-pay | Admitting: Emergency Medicine

## 2013-04-24 DIAGNOSIS — I129 Hypertensive chronic kidney disease with stage 1 through stage 4 chronic kidney disease, or unspecified chronic kidney disease: Secondary | ICD-10-CM | POA: Diagnosis present

## 2013-04-24 DIAGNOSIS — Z5181 Encounter for therapeutic drug level monitoring: Secondary | ICD-10-CM

## 2013-04-24 DIAGNOSIS — E039 Hypothyroidism, unspecified: Secondary | ICD-10-CM | POA: Diagnosis present

## 2013-04-24 DIAGNOSIS — Z7901 Long term (current) use of anticoagulants: Secondary | ICD-10-CM

## 2013-04-24 DIAGNOSIS — Y92009 Unspecified place in unspecified non-institutional (private) residence as the place of occurrence of the external cause: Secondary | ICD-10-CM

## 2013-04-24 DIAGNOSIS — N1831 Chronic kidney disease, stage 3a: Secondary | ICD-10-CM | POA: Diagnosis present

## 2013-04-24 DIAGNOSIS — N183 Chronic kidney disease, stage 3 unspecified: Secondary | ICD-10-CM | POA: Diagnosis present

## 2013-04-24 DIAGNOSIS — T18128A Food in esophagus causing other injury, initial encounter: Secondary | ICD-10-CM

## 2013-04-24 DIAGNOSIS — S72033A Displaced midcervical fracture of unspecified femur, initial encounter for closed fracture: Principal | ICD-10-CM

## 2013-04-24 DIAGNOSIS — I4891 Unspecified atrial fibrillation: Secondary | ICD-10-CM | POA: Diagnosis present

## 2013-04-24 DIAGNOSIS — I509 Heart failure, unspecified: Secondary | ICD-10-CM | POA: Diagnosis present

## 2013-04-24 DIAGNOSIS — I5032 Chronic diastolic (congestive) heart failure: Secondary | ICD-10-CM | POA: Diagnosis present

## 2013-04-24 DIAGNOSIS — M949 Disorder of cartilage, unspecified: Secondary | ICD-10-CM

## 2013-04-24 DIAGNOSIS — Z79899 Other long term (current) drug therapy: Secondary | ICD-10-CM

## 2013-04-24 DIAGNOSIS — R197 Diarrhea, unspecified: Secondary | ICD-10-CM

## 2013-04-24 DIAGNOSIS — J189 Pneumonia, unspecified organism: Secondary | ICD-10-CM | POA: Diagnosis present

## 2013-04-24 DIAGNOSIS — D72829 Elevated white blood cell count, unspecified: Secondary | ICD-10-CM | POA: Diagnosis present

## 2013-04-24 DIAGNOSIS — K222 Esophageal obstruction: Secondary | ICD-10-CM | POA: Diagnosis present

## 2013-04-24 DIAGNOSIS — R1310 Dysphagia, unspecified: Secondary | ICD-10-CM | POA: Diagnosis present

## 2013-04-24 DIAGNOSIS — J441 Chronic obstructive pulmonary disease with (acute) exacerbation: Secondary | ICD-10-CM | POA: Diagnosis present

## 2013-04-24 DIAGNOSIS — K5900 Constipation, unspecified: Secondary | ICD-10-CM | POA: Diagnosis not present

## 2013-04-24 DIAGNOSIS — J9600 Acute respiratory failure, unspecified whether with hypoxia or hypercapnia: Secondary | ICD-10-CM | POA: Diagnosis present

## 2013-04-24 DIAGNOSIS — I10 Essential (primary) hypertension: Secondary | ICD-10-CM | POA: Diagnosis present

## 2013-04-24 DIAGNOSIS — J449 Chronic obstructive pulmonary disease, unspecified: Secondary | ICD-10-CM

## 2013-04-24 DIAGNOSIS — R001 Bradycardia, unspecified: Secondary | ICD-10-CM

## 2013-04-24 DIAGNOSIS — S72011A Unspecified intracapsular fracture of right femur, initial encounter for closed fracture: Secondary | ICD-10-CM

## 2013-04-24 DIAGNOSIS — S72001A Fracture of unspecified part of neck of right femur, initial encounter for closed fracture: Secondary | ICD-10-CM | POA: Diagnosis present

## 2013-04-24 DIAGNOSIS — K219 Gastro-esophageal reflux disease without esophagitis: Secondary | ICD-10-CM | POA: Diagnosis present

## 2013-04-24 DIAGNOSIS — N179 Acute kidney failure, unspecified: Secondary | ICD-10-CM

## 2013-04-24 DIAGNOSIS — M899 Disorder of bone, unspecified: Secondary | ICD-10-CM | POA: Diagnosis present

## 2013-04-24 DIAGNOSIS — W19XXXA Unspecified fall, initial encounter: Secondary | ICD-10-CM | POA: Diagnosis present

## 2013-04-24 LAB — URINALYSIS, ROUTINE W REFLEX MICROSCOPIC
Bilirubin Urine: NEGATIVE
Glucose, UA: NEGATIVE mg/dL
Ketones, ur: NEGATIVE mg/dL
Leukocytes, UA: NEGATIVE
Nitrite: NEGATIVE
Protein, ur: NEGATIVE mg/dL
Specific Gravity, Urine: 1.026 (ref 1.005–1.030)
Urobilinogen, UA: 0.2 mg/dL (ref 0.0–1.0)
pH: 5 (ref 5.0–8.0)

## 2013-04-24 LAB — PROTIME-INR
INR: 2.12 — ABNORMAL HIGH (ref 0.00–1.49)
Prothrombin Time: 23.1 seconds — ABNORMAL HIGH (ref 11.6–15.2)

## 2013-04-24 LAB — CBC
HEMATOCRIT: 40.4 % (ref 39.0–52.0)
Hemoglobin: 14.1 g/dL (ref 13.0–17.0)
MCH: 34.5 pg — ABNORMAL HIGH (ref 26.0–34.0)
MCHC: 34.9 g/dL (ref 30.0–36.0)
MCV: 98.8 fL (ref 78.0–100.0)
Platelets: 228 10*3/uL (ref 150–400)
RBC: 4.09 MIL/uL — AB (ref 4.22–5.81)
RDW: 13 % (ref 11.5–15.5)
WBC: 15.7 10*3/uL — AB (ref 4.0–10.5)

## 2013-04-24 LAB — STREP PNEUMONIAE URINARY ANTIGEN: Strep Pneumo Urinary Antigen: NEGATIVE

## 2013-04-24 LAB — BASIC METABOLIC PANEL
BUN: 35 mg/dL — AB (ref 6–23)
CHLORIDE: 105 meq/L (ref 96–112)
CO2: 22 meq/L (ref 19–32)
Calcium: 8.7 mg/dL (ref 8.4–10.5)
Creatinine, Ser: 1.75 mg/dL — ABNORMAL HIGH (ref 0.50–1.35)
GFR calc non Af Amer: 32 mL/min — ABNORMAL LOW (ref >90)
GFR, EST AFRICAN AMERICAN: 37 mL/min — AB (ref >90)
Glucose, Bld: 101 mg/dL — ABNORMAL HIGH (ref 70–99)
POTASSIUM: 5.3 meq/L (ref 3.7–5.3)
Sodium: 141 mEq/L (ref 137–147)

## 2013-04-24 LAB — URINE MICROSCOPIC-ADD ON

## 2013-04-24 LAB — TYPE AND SCREEN
ABO/RH(D): B NEG
Antibody Screen: NEGATIVE

## 2013-04-24 LAB — ABO/RH: ABO/RH(D): B NEG

## 2013-04-24 MED ORDER — MORPHINE SULFATE 2 MG/ML IJ SOLN: 0.5000 mg | INTRAMUSCULAR | Status: DC | PRN

## 2013-04-24 MED ORDER — BUDESONIDE 0.25 MG/2ML IN SUSP
0.2500 mg | Freq: Two times a day (BID) | RESPIRATORY_TRACT | Status: DC
  Administered 2013-04-24 – 2013-04-27 (×7): 0.25 mg via RESPIRATORY_TRACT
  Filled 2013-04-24 (×10): qty 2

## 2013-04-24 MED ORDER — HYDROCODONE-ACETAMINOPHEN 5-325 MG PO TABS
1.0000 | ORAL_TABLET | Freq: Four times a day (QID) | ORAL | Status: DC | PRN
Start: 2013-04-24 — End: 2013-04-25
  Administered 2013-04-24: 1 via ORAL
  Filled 2013-04-24: qty 1

## 2013-04-24 MED ORDER — CEFAZOLIN SODIUM-DEXTROSE 2-3 GM-% IV SOLR
2.0000 g | INTRAVENOUS | Status: AC
  Administered 2013-04-25: 2 g via INTRAVENOUS
  Filled 2013-04-24: qty 50

## 2013-04-24 MED ORDER — LEVOFLOXACIN IN D5W 500 MG/100ML IV SOLN
500.0000 mg | Freq: Once | INTRAVENOUS | Status: AC
  Administered 2013-04-24: 500 mg via INTRAVENOUS
  Filled 2013-04-24: qty 100

## 2013-04-24 MED ORDER — FUROSEMIDE 40 MG PO TABS
40.0000 mg | ORAL_TABLET | Freq: Every day | ORAL | Status: DC | PRN
  Filled 2013-04-24: qty 1

## 2013-04-24 MED ORDER — ATENOLOL 25 MG PO TABS
25.0000 mg | ORAL_TABLET | Freq: Every day | ORAL | Status: DC
  Administered 2013-04-26 – 2013-04-28 (×3): 25 mg via ORAL
  Filled 2013-04-24 (×5): qty 1

## 2013-04-24 MED ORDER — LEVOFLOXACIN IN D5W 750 MG/150ML IV SOLN
750.0000 mg | INTRAVENOUS | Status: DC
  Administered 2013-04-24: 750 mg via INTRAVENOUS
  Filled 2013-04-24 (×2): qty 150

## 2013-04-24 MED ORDER — VITAMIN K1 10 MG/ML IJ SOLN: 1.0000 mg | Freq: Once | INTRAVENOUS | Status: DC

## 2013-04-24 MED ORDER — ONDANSETRON HCL 4 MG/2ML IJ SOLN
4.0000 mg | Freq: Once | INTRAMUSCULAR | Status: AC
  Administered 2013-04-24: 4 mg via INTRAVENOUS
  Filled 2013-04-24: qty 2

## 2013-04-24 MED ORDER — ALBUTEROL SULFATE (2.5 MG/3ML) 0.083% IN NEBU
2.5000 mg | INHALATION_SOLUTION | RESPIRATORY_TRACT | Status: AC
  Administered 2013-04-24: 2.5 mg via RESPIRATORY_TRACT
  Filled 2013-04-24 (×2): qty 3

## 2013-04-24 MED ORDER — MORPHINE SULFATE 4 MG/ML IJ SOLN
4.0000 mg | Freq: Once | INTRAMUSCULAR | Status: AC
  Administered 2013-04-24: 4 mg via INTRAVENOUS
  Filled 2013-04-24: qty 1

## 2013-04-24 MED ORDER — CHLORHEXIDINE GLUCONATE 4 % EX LIQD
60.0000 mL | Freq: Once | CUTANEOUS | Status: DC
  Filled 2013-04-24: qty 60

## 2013-04-24 MED ORDER — HYDROMORPHONE HCL PF 1 MG/ML IJ SOLN
0.5000 mg | INTRAMUSCULAR | Status: DC | PRN
  Administered 2013-04-24: 0.5 mg via INTRAVENOUS
  Filled 2013-04-24: qty 1

## 2013-04-24 MED ORDER — HEPARIN (PORCINE) IN NACL 100-0.45 UNIT/ML-% IJ SOLN
1000.0000 [IU]/h | INTRAMUSCULAR | Status: DC
  Administered 2013-04-24: 1000 [IU]/h via INTRAVENOUS
  Filled 2013-04-24 (×2): qty 250

## 2013-04-24 MED ORDER — ONDANSETRON HCL 4 MG/2ML IJ SOLN: 4.0000 mg | Freq: Three times a day (TID) | INTRAMUSCULAR | Status: AC | PRN

## 2013-04-24 MED ORDER — ALBUTEROL SULFATE (2.5 MG/3ML) 0.083% IN NEBU
2.5000 mg | INHALATION_SOLUTION | Freq: Four times a day (QID) | RESPIRATORY_TRACT | Status: DC | PRN
  Administered 2013-04-24 – 2013-04-28 (×5): 2.5 mg via RESPIRATORY_TRACT
  Filled 2013-04-24 (×5): qty 3

## 2013-04-24 MED ORDER — LEVOTHYROXINE SODIUM 50 MCG PO TABS
50.0000 ug | ORAL_TABLET | Freq: Every day | ORAL | Status: DC
  Administered 2013-04-26 – 2013-04-28 (×3): 50 ug via ORAL
  Filled 2013-04-24 (×5): qty 1

## 2013-04-24 MED ORDER — FLECAINIDE ACETATE 50 MG PO TABS
50.0000 mg | ORAL_TABLET | Freq: Two times a day (BID) | ORAL | Status: DC
  Administered 2013-04-24 – 2013-04-28 (×7): 50 mg via ORAL
  Filled 2013-04-24 (×9): qty 1

## 2013-04-24 MED ORDER — DEXTROSE 5 % IV SOLN
1.0000 mg | Freq: Once | INTRAVENOUS | Status: DC
  Filled 2013-04-24: qty 0.1

## 2013-04-24 MED ORDER — PANTOPRAZOLE SODIUM 40 MG PO TBEC
40.0000 mg | DELAYED_RELEASE_TABLET | Freq: Every day | ORAL | Status: DC
  Administered 2013-04-26: 40 mg via ORAL
  Filled 2013-04-24: qty 1

## 2013-04-24 NOTE — ED Notes (Signed)
Pt from home. Pt told EMS that he was at the fridge an lost balance and fell on his R Hip. Pt denies LOC. EMS states there is no obvious injury. Pt states with no movement there is no pain, but is 10/10 with movement. Pt has wheezes in the left upper and lower lung fields. Pt denies COPD or CHF. PT used 3 puffs of his albuterol inhaler at home and was given a duoneb  And atrovent by EMS.  CbG- 110 BP- 168/76 HR- 54 Reg (Pt states he takes atenolol) SpO2- 99% with duoneb RR- 18

## 2013-04-24 NOTE — ED Provider Notes (Addendum)
CSN: [id number]     Arrival date & time 04/24/13  1409 History   First MD Initiated Contact with Patient 04/24/13 1416     Chief Complaint  Patient presents with  . Shortness of Breath  . Fall  . Hip Pain     (Consider location/radiation/quality/duration/timing/severity/associated sxs/prior Treatment) HPI  80 year old male with right hip pain. Onset as before arrival. Patient had mechanical fall. Patient was at refrigerator at home wen his feet tangled up underneath him and he fell onto his right side. He does not think he hit his head. No loss of consciousness. Denies headaches or neck pain. Chronic back pain, but no acute change. Has not tried ambulating secondary to pain in his hip. No numbness or tingling. Pain is tolerable at rest but has exquisite pain with any attempted range of motion. Patient is on warfarin.  Past Medical History  Diagnosis Date  . GERD (gastroesophageal reflux disease)   . Hypertension   . COPD (chronic obstructive pulmonary disease)   . Chronic kidney disease   . Ventricular bigeminy   . PVC (premature ventricular contraction)   . Dyspnea    Past Surgical History  Procedure Laterality Date  . Cholecystectomy    . Splenectomy, partial     Family History  Problem Relation Age of Onset  . Hypertension Father    History  Substance Use Topics  . Smoking status: Never Smoker   . Smokeless tobacco: Not on file  . Alcohol Use: No    Review of Systems  All systems reviewed and negative, other than as noted in HPI.   Allergies  Diltiazem hcl er and Metoprolol succinate er  Home Medications   Current Outpatient Rx  Name  Route  Sig  Dispense  Refill  . Acetaminophen (TYLENOL EXTRA STRENGTH PO)   Oral   Take 2 tablets by mouth daily as needed (for pain).          Marland Kitchen. albuterol (PROVENTIL HFA;VENTOLIN HFA) 108 (90 BASE) MCG/ACT inhaler   Inhalation   Inhale 2 puffs into the lungs every 6 (six) hours as needed for wheezing.         Marland Kitchen.  atenolol (TENORMIN) 25 MG tablet   Oral   Take 1 tablet (25 mg total) by mouth daily.   30 tablet   11   . flecainide (TAMBOCOR) 100 MG tablet   Oral   Take 50 mg by mouth 2 (two) times daily.         . furosemide (LASIX) 20 MG tablet   Oral   Take 40 mg by mouth daily as needed for fluid.          Marland Kitchen. levothyroxine (SYNTHROID, LEVOTHROID) 100 MCG tablet   Oral   Take 50 mcg by mouth daily before breakfast.          . omeprazole (PRILOSEC) 20 MG capsule   Oral   Take 20 mg by mouth daily.         Marland Kitchen. warfarin (COUMADIN) 2.5 MG tablet      Take 1 tablet on all days except 1.5 tablets on Monday, Wednesday, and Friday; or as directed by [facility]madin Clinic.   38 tablet   3     Quantity change    BP 135/59  Pulse 60  Resp 24  Ht 5\' 8"  (1.727 m)  Wt 187 lb (84.823 kg)  BMI 28.44 kg/m2  SpO2 100% Physical Exam  Nursing note and vitals reviewed. Constitutional: He appears well-developed and  well-nourished. No distress.  HENT:  Head: Normocephalic and atraumatic.  Eyes: Conjunctivae are normal. Right eye exhibits no discharge. Left eye exhibits no discharge.  Neck: Neck supple.  Cardiovascular: Normal rate, regular rhythm and normal heart sounds.  Exam reveals no gallop and no friction rub.   No murmur heard. Pulmonary/Chest: Effort normal. No respiratory distress. He has wheezes.  Expiratory wheezing bilaterally.  Abdominal: Soft. He exhibits no distension. There is no tenderness.  Musculoskeletal: He exhibits no edema and no tenderness.  Right lower extremities symmetric as compared to left. No shortening or malrotation. Palpable DP pulse. Sensation is intact to light touch. Pelvis is stable to rocking. Exquisite pain with any attempted range of motion of the right hip.  Neurological: He is alert.  Skin: Skin is warm and dry. He is not diaphoretic.  Psychiatric: He has a normal mood and affect. His behavior is normal. Thought content normal.    ED Course   Procedures (including critical care time) Labs Review Labs Reviewed  CBC - Abnormal; Notable for the following:    WBC 15.7 (*)    RBC 4.09 (*)    MCH 34.5 (*)    All other components within normal limits  BASIC METABOLIC PANEL - Abnormal; Notable for the following:    Glucose, Bld 101 (*)    BUN 35 (*)    Creatinine, Ser 1.75 (*)    GFR calc non Af Amer 32 (*)    GFR calc Af Amer 37 (*)    All other components within normal limits  PROTIME-INR - Abnormal; Notable for the following:    Prothrombin Time 23.1 (*)    INR 2.12 (*)    All other components within normal limits  TYPE AND SCREEN   Imaging Review Dg Chest 1 View  04/24/2013   CLINICAL DATA:  Larey Seat this morning. Right hip fracture. Preoperative respiratory evaluation. Shortness of breath.  EXAM: CHEST - 1 VIEW  COMPARISON:  DG CHEST 1V PORT dated 12/16/2012; CT CHEST W/O CM dated 06/13/2007; CT CHEST W/CM dated 12/01/2006; DG CHEST 2 VIEW dated 12/08/2004  FINDINGS: Focal opacity at the right lung base, increased since the prior examinations. The prior CT showed new interstitial fibrosis in the right lower lobe. Mild hyperinflation and mild emphysematous changes in the upper lobes, unchanged. Lungs otherwise clear. No pleural effusions. No pneumothorax. Normal pulmonary vascularity. Cardiac silhouette mildly enlarged but stable.  IMPRESSION: 1. Increased opacity at the right lung base compared to prior examinations, with baseline interstitial fibrosis in the right lower lobe. This could represent worsening fibrosis or a superimposed bronchopneumonia. Are there clinical findings to suggest pneumonia? 2. Stable COPD/emphysema. No acute cardiopulmonary disease otherwise. 3. Stable mild cardiomegaly without pulmonary edema.   Electronically Signed   By: Hulan Saas M.D.   On: 04/24/2013 16:22   Dg Hip Complete Right  04/24/2013   CLINICAL DATA:  Fall.  Right hip pain.  EXAM: RIGHT HIP - COMPLETE 2+ VIEW  COMPARISON:  None.  FINDINGS:  The patient has a subcapital right hip fracture. No other acute bony or joint abnormality is identified. Bones are osteopenic.  IMPRESSION: Acute subcapital right hip fracture.   Electronically Signed   By: Drusilla Kanner M.D.   On: 04/24/2013 16:18    EKG Interpretation   None       MDM   Final diagnoses:  Closed subcapital fracture of right femur  COPD (chronic obstructive pulmonary disease)  Warfarin anticoagulation    80 year old male with  right hip pain status post mechanical fall. Clinical concern hip or pubic ramus fracture. Treat symptoms. Imaging. Reassessment.  Imaging is significant for right subcapital femoral fracture. Will discuss with orthopedic surgery. Foley catheter. Will discuss with medicine for admission.  Raeford Razor, MD 04/24/13 1632  4:50 PM Discussed with Dr Ave Filter, ortho. With INR, tentatively planning for OR tomorrow. Discussed with Dr Gwenlyn Perking, medicine for admit. Levaquin for possible CAP.   Raeford Razor, MD 04/24/13 [phone]

## 2013-04-24 NOTE — H&P (Signed)
[facility] History and Physical  Jordan BeatRalph B Zangara ZOX:[id number] DOB: [birth year] DOA: 04/24/2013  Referring physician: Dr. Juleen ChinaKohut PCP: [name],[name], MD   Chief Complaint: right hip pain, mild hypoxia  HPI: Jordan [name] is a 80 y.o. male with PMH significant for COPD, diastolic heart failure; atrial fibrillation (on coumadin), CKD (stage 3) and hypothyroidism; came to ED complaining of right hip pain and decrease movement of right leg after mechanical fall. Patient found with closed right hip fracture on x-ray; he was also seen to be mildly hypoxic, with elevated WBC's and CXR demonstrated superimposed new infiltrates on Left Lower lobes. Patient denies fever, chills, chest pain, nausea, vomiting, HS's, palpitation, melena, hematochezia or any other acute complaints. [facility] called to admit patient and orthopedic service consulted for R hip fracture.   Review of Systems:  Negative except as mentioned on HPI  Past Medical History  Diagnosis Date  . GERD (gastroesophageal reflux disease)   . Hypertension   . COPD (chronic obstructive pulmonary disease)   . Chronic kidney disease   . Ventricular bigeminy   . PVC (premature ventricular contraction)   . Dyspnea    Past Surgical History  Procedure Laterality Date  . Cholecystectomy    . Splenectomy, partial     Social History:  reports that he has never smoked. He does not have any smokeless tobacco history on file. He reports that he does not drink alcohol or use illicit drugs.  Allergies  Allergen Reactions  . Diltiazem Hcl Er Other (See Comments)    Tachycardia  . Metoprolol Succinate Er [Metoprolol Tartrate] Other (See Comments)    hallucinations    Family History  Problem Relation Age of Onset  . Hypertension Father      Prior to Admission medications   Medication Sig Start Date End Date Taking? Authorizing Provider  Acetaminophen (TYLENOL EXTRA STRENGTH PO) Take 2 tablets by mouth daily as needed (for pain).     Yes Historical Provider, MD  albuterol (PROVENTIL HFA;VENTOLIN HFA) 108 (90 BASE) MCG/ACT inhaler Inhale 2 puffs into the lungs every 6 (six) hours as needed for wheezing.   Yes Historical Provider, MD  atenolol (TENORMIN) 25 MG tablet Take 1 tablet (25 mg total) by mouth daily. 12/20/12  Yes Everette RankJay Varanasi, MD  flecainide (TAMBOCOR) 100 MG tablet Take 50 mg by mouth 2 (two) times daily.   Yes Historical Provider, MD  furosemide (LASIX) 20 MG tablet Take 40 mg by mouth daily as needed for fluid.  12/20/12  Yes Everette RankJay Varanasi, MD  levothyroxine (SYNTHROID, LEVOTHROID) 100 MCG tablet Take 50 mcg by mouth daily before breakfast.    Yes Historical Provider, MD  omeprazole (PRILOSEC) 20 MG capsule Take 20 mg by mouth daily.   Yes Historical Provider, MD  warfarin (COUMADIN) 2.5 MG tablet Take 1 tablet on all days except 1.5 tablets on Monday, Wednesday, and Friday; or as directed by Coumadin Clinic. 01/17/13  Yes Donato SchultzMark [name], MD   Physical Exam: Filed Vitals:   04/24/13 1845  BP: 132/57  Pulse: 59  Resp: 17    BP 132/57  Pulse 59  Resp 17  Ht 5\' 8"  (1.727 m)  Wt 84.823 kg (187 lb)  BMI 28.44 kg/m2  SpO2 97%  General:  Appears calm and comfortable; no fever; scattered cough throughout examinion Eyes: PERRL, normal lids, irises & conjunctiva ENT: grossly normal hearing, no erythema or exudates; no drainage out of ears or nostrils Neck: no LAD, masses or thyromegaly; no JVD Cardiovascular: Regular  rate, no rubs or gallops. No LE edema. Respiratory: CTA bilaterally, no w/r/r. Normal respiratory effort. Abdomen: soft, nt, nd, positive BS Skin: no rash or induration seen on exam Musculoskeletal: grossly normal tone BUE/BLE; decrease movement on RLE due to hip pain Psychiatric: grossly normal mood and affect, speech fluent and appropriate Neurologic: grossly non-focal.          Labs on Admission:  Basic Metabolic Panel:  Recent Labs Lab 04/24/13 1437  NA 141  K 5.3  CL 105  CO2 22   GLUCOSE 101*  BUN 35*  CREATININE 1.75*  CALCIUM 8.7   CBC:  Recent Labs Lab 04/24/13 1437  WBC 15.7*  HGB 14.1  HCT 40.4  MCV 98.8  PLT 228   BNP (last 3 results)  Recent Labs  12/19/12 1615  PROBNP 669.1*   CBG: No results found for this basename: GLUCAP,  in the last 168 hours  Radiological Exams on Admission: Dg Chest 1 View  04/24/2013   CLINICAL DATA:  Larey Seat this morning. Right hip fracture. Preoperative respiratory evaluation. Shortness of breath.  EXAM: CHEST - 1 VIEW  COMPARISON:  DG CHEST 1V PORT dated 12/16/2012; CT CHEST W/O CM dated 06/13/2007; CT CHEST W/CM dated 12/01/2006; DG CHEST 2 VIEW dated 12/08/2004  FINDINGS: Focal opacity at the right lung base, increased since the prior examinations. The prior CT showed new interstitial fibrosis in the right lower lobe. Mild hyperinflation and mild emphysematous changes in the upper lobes, unchanged. Lungs otherwise clear. No pleural effusions. No pneumothorax. Normal pulmonary vascularity. Cardiac silhouette mildly enlarged but stable.  IMPRESSION: 1. Increased opacity at the right lung base compared to prior examinations, with baseline interstitial fibrosis in the right lower lobe. This could represent worsening fibrosis or a superimposed bronchopneumonia. Are there clinical findings to suggest pneumonia? 2. Stable COPD/emphysema. No acute cardiopulmonary disease otherwise. 3. Stable mild cardiomegaly without pulmonary edema.   Electronically Signed   By: Hulan Saas M.D.   On: 04/24/2013 16:22   Dg Hip Complete Right  04/24/2013   CLINICAL DATA:  Fall.  Right hip pain.  EXAM: RIGHT HIP - COMPLETE 2+ VIEW  COMPARISON:  None.  FINDINGS: The patient has a subcapital right hip fracture. No other acute bony or joint abnormality is identified. Bones are osteopenic.  IMPRESSION: Acute subcapital right hip fracture.   Electronically Signed   By: Drusilla Kanner M.D.   On: 04/24/2013 16:18    EKG: Rate controlled and currently  sinus; no acute ischemic changes.  Assessment/Plan 1-Closed right hip fracture: secondary to mechanical fall. -bed rest -follow ortho rec's -most likely partial hemiarthroplasty on 2/13 -will reversed coumadin (vit K 2mg  IV will be given) -follow hip fracture protocol for pain control  2-Hypertension: stable. Will continue current medication regimen  3-Hypothyroidism: continue synthroid  4-Atrial fibrillation: rate controlled currently.  -Will admit to telemetry -continue atenolol and tambocor -coumadin to be reversed and plan is to use heparin and bridge after surgery  5-acute resp failure with hypoxia due to CAP: -will treat with levaquin -oxygen supplementation -strep and legionella antigen in urine -sputum cultures -follow clinical response -prn nebs and start incentive spirometry  6-COPD (chronic obstructive pulmonary disease): continue nebs and pulmicort -patient is currently not wheezing  7-Chronic anticoagulation: to be reversed for surgery. Pharmacy consult to assist with coumadin after.  8-Chronic diastolic heart failure: currently compensated. -Will check strict I's and O's -daily weights -continue lasix  9-Chronic kidney disease (CKD) stage G3a/A1, moderately decreased glomerular filtration  rate (GFR) between 45-59 mL/min: -currently stable -will monitor  10-Leukocytosis: due to CAP and also demargination from fracture. -will follow WBC's trend   Orthopedic service (Dr. Ave Filter)  Code Status: Full Family Communication: daughter at bedside Disposition Plan: inpatient, LOS > 2 midnights; telemetry  Time spent: 50 minutes  Hallelujah Wysong [facility] Pager [phone]

## 2013-04-24 NOTE — Consult Note (Signed)
Reason for Consult: Evaluate right displaced hip fracture Referring Physician: Darren Caldron is an 80 y.o. male.  HPI: 80 year old male who fell today at home getting something out of the refrigerator. Complaint of right hip pain and inability to bear weight. X-rays in the emergency department showed displaced right femoral neck fracture. I was consulted for evaluation and management. Pain is deep in the groin, worse with movement, better with rest. Moderate to severe. There was medication in the emergency department. Denies other injury with the fall. Denies syncope.  Past Medical History  Diagnosis Date  . GERD (gastroesophageal reflux disease)   . Hypertension   . COPD (chronic obstructive pulmonary disease)   . Chronic kidney disease   . Ventricular bigeminy   . PVC (premature ventricular contraction)   . Dyspnea     Past Surgical History  Procedure Laterality Date  . Cholecystectomy    . Splenectomy, partial      Family History  Problem Relation Age of Onset  . Hypertension Father     Social History:  reports that he has never smoked. He does not have any smokeless tobacco history on file. He reports that he does not drink alcohol or use illicit drugs.  Allergies:  Allergies  Allergen Reactions  . Diltiazem Hcl Er Other (See Comments)    Tachycardia  . Metoprolol Succinate Er [Metoprolol Tartrate] Other (See Comments)    hallucinations    Medications: I have reviewed the patient's current medications.  Results for orders placed during the hospital encounter of 04/24/13 (from the past 48 hour(s))  CBC     Status: Abnormal   Collection Time    04/24/13  2:37 PM      Result Value Ref Range   WBC 15.7 (*) 4.0 - 10.5 K/uL   RBC 4.09 (*) 4.22 - 5.81 MIL/uL   Hemoglobin 14.1  13.0 - 17.0 g/dL   HCT 40.4  39.0 - 52.0 %   MCV 98.8  78.0 - 100.0 fL   MCH 34.5 (*) 26.0 - 34.0 pg   MCHC 34.9  30.0 - 36.0 g/dL   RDW 13.0  11.5 - 15.5 %   Platelets 228  150 - 400  K/uL  BASIC METABOLIC PANEL     Status: Abnormal   Collection Time    04/24/13  2:37 PM      Result Value Ref Range   Sodium 141  137 - 147 mEq/L   Potassium 5.3  3.7 - 5.3 mEq/L   Comment: HEMOLYSIS AT THIS LEVEL MAY AFFECT RESULT   Chloride 105  96 - 112 mEq/L   CO2 22  19 - 32 mEq/L   Glucose, Bld 101 (*) 70 - 99 mg/dL   BUN 35 (*) 6 - 23 mg/dL   Creatinine, Ser 1.75 (*) 0.50 - 1.35 mg/dL   Calcium 8.7  8.4 - 10.5 mg/dL   GFR calc non Af Amer 32 (*) >90 mL/min   GFR calc Af Amer 37 (*) >90 mL/min   Comment: (NOTE)     The eGFR has been calculated using the CKD EPI equation.     This calculation has not been validated in all clinical situations.     eGFR's persistently <90 mL/min signify possible Chronic Kidney     Disease.  PROTIME-INR     Status: Abnormal   Collection Time    04/24/13  2:37 PM      Result Value Ref Range   Prothrombin Time 23.1 (*)  11.6 - 15.2 seconds   INR 2.12 (*) 0.00 - 1.49  TYPE AND SCREEN     Status: None   Collection Time    04/24/13  3:04 PM      Result Value Ref Range   ABO/RH(D) B NEG     Antibody Screen NEG     Sample Expiration 04/27/2013    ABO/RH     Status: None   Collection Time    04/24/13  3:04 PM      Result Value Ref Range   ABO/RH(D) B NEG    URINALYSIS, ROUTINE W REFLEX MICROSCOPIC     Status: Abnormal   Collection Time    04/24/13  5:00 PM      Result Value Ref Range   Color, Urine BROWN (*) YELLOW   Comment: BIOCHEMICALS MAY BE AFFECTED BY COLOR   APPearance CLOUDY (*) CLEAR   Specific Gravity, Urine 1.026  1.005 - 1.030   pH 5.0  5.0 - 8.0   Glucose, UA NEGATIVE  NEGATIVE mg/dL   Hgb urine dipstick LARGE (*) NEGATIVE   Bilirubin Urine NEGATIVE  NEGATIVE   Ketones, ur NEGATIVE  NEGATIVE mg/dL   Protein, ur NEGATIVE  NEGATIVE mg/dL   Urobilinogen, UA 0.2  0.0 - 1.0 mg/dL   Nitrite NEGATIVE  NEGATIVE   Leukocytes, UA NEGATIVE  NEGATIVE  URINE MICROSCOPIC-ADD ON     Status: Abnormal   Collection Time    04/24/13  5:00  PM      Result Value Ref Range   Squamous Epithelial / LPF RARE  RARE   WBC, UA 0-2  <3 WBC/hpf   RBC / HPF TOO NUMEROUS TO COUNT  <3 RBC/hpf   Casts HYALINE CASTS (*) NEGATIVE    Dg Chest 1 View  04/24/2013   CLINICAL DATA:  [place] this morning. Right hip fracture. Preoperative respiratory evaluation. Shortness of breath.  EXAM: CHEST - 1 VIEW  COMPARISON:  DG CHEST 1V PORT dated 12/16/2012; CT CHEST W/O CM dated 06/13/2007; CT CHEST W/CM dated 12/01/2006; DG CHEST 2 VIEW dated 12/08/2004  FINDINGS: Focal opacity at the right lung base, increased since the prior examinations. The prior CT showed new interstitial fibrosis in the right lower lobe. Mild hyperinflation and mild emphysematous changes in the upper lobes, unchanged. Lungs otherwise clear. No pleural effusions. No pneumothorax. Normal pulmonary vascularity. Cardiac silhouette mildly enlarged but stable.  IMPRESSION: 1. Increased opacity at the right lung base compared to prior examinations, with baseline interstitial fibrosis in the right lower lobe. This could represent worsening fibrosis or a superimposed bronchopneumonia. Are there clinical findings to suggest pneumonia? 2. Stable COPD/emphysema. No acute cardiopulmonary disease otherwise. 3. Stable mild cardiomegaly without pulmonary edema.   Electronically Signed   By: Evangeline Dakin M.D.   On: 04/24/2013 16:22   Dg Hip Complete Right  04/24/2013   CLINICAL DATA:  Fall.  Right hip pain.  EXAM: RIGHT HIP - COMPLETE 2+ VIEW  COMPARISON:  None.  FINDINGS: The patient has a subcapital right hip fracture. No other acute bony or joint abnormality is identified. Bones are osteopenic.  IMPRESSION: Acute subcapital right hip fracture.   Electronically Signed   By: Inge Rise M.D.   On: 04/24/2013 16:18    Review of Systems  All other systems reviewed and are negative.   Blood pressure 142/54, pulse 64, resp. rate 16, height $RemoveBe'5\' 8"'[id number]$  (1.727 m), weight 84.823 kg (187 lb), SpO2 91.00%. Physical  Exam  Constitutional: He is oriented  to person, place, and time. He appears well-nourished.  HENT:  Head: Atraumatic.  Eyes: EOM are normal.  Cardiovascular: Intact distal pulses.   Respiratory: Effort normal.  Musculoskeletal:  Right lower extremity shortened and externally rotated. Tender to palpation about the hip and pain with any log roll. Distally neurovascularly intact. Remaining extremities atraumatic.  Neurological: He is alert and oriented to person, place, and time.  Skin: Skin is warm and dry.  Psychiatric: He has a normal mood and affect.    Assessment/Plan: Right displaced femoral neck fracture Recommend right hip hemiarthroplasty for early mobilization Discussed this with his daughter at the bedside. They both agree and would like to go forward with surgery. He is on Coumadin and currently therapeutic for chronic atrial fibrillation. We will plan on surgery tomorrow to allow his INR to partially correct. He could have vitamin K if that is deemed safe by the primary medical service. N.p.o. past midnight  [place] 04/24/2013, 6:23 PM

## 2013-04-24 NOTE — Progress Notes (Signed)
ANTICOAGULATION CONSULT NOTE - Initial Consult  Pharmacy Consult for Heparin  Indication: atrial fibrillation   Allergies  Allergen Reactions  . Diltiazem Hcl Er Other (See Comments)    Tachycardia  . Metoprolol Succinate Er [Metoprolol Tartrate] Other (See Comments)    hallucinations    Patient Measurements: Height: 5\' 8"  (172.7 cm) Weight: 187 lb (84.823 kg) IBW/kg (Calculated) : 68.4 Heparin Dosing Weight:   Vital Signs: BP: 132/57 mmHg (02/12 1845) Pulse Rate: 59 (02/12 1845)  Labs:  Recent Labs  04/24/13 1437  HGB 14.1  HCT 40.4  PLT 228  LABPROT 23.1*  INR 2.12*  CREATININE 1.75*    Estimated Creatinine Clearance: 28.6 ml/min (by C-G formula based on Cr of 1.75).   Medical History: Past Medical History  Diagnosis Date  . GERD (gastroesophageal reflux disease)   . Hypertension   . COPD (chronic obstructive pulmonary disease)   . Chronic kidney disease   . Ventricular bigeminy   . PVC (premature ventricular contraction)   . Dyspnea     Medications:  Scheduled:  . albuterol  2.5 mg Nebulization Q4H  . [START ON 04/25/2013] atenolol  25 mg Oral Daily  . budesonide (PULMICORT) nebulizer solution  0.25 mg Nebulization BID  . [START ON 04/25/2013]  ceFAZolin (ANCEF) IV  2 g Intravenous On Call to OR  . [START ON 04/25/2013] chlorhexidine  60 mL Topical Once  . flecainide  50 mg Oral BID  . levofloxacin (LEVAQUIN) IV  750 mg Intravenous Q24H  . [START ON 04/25/2013] levothyroxine  50 mcg Oral QAC breakfast  . [START ON 04/25/2013] pantoprazole  40 mg Oral Daily  . phytonadione (VITAMIN K) IV  1 mg Intravenous Once    Assessment: 80 yr old male fell at home and fractured his hip. He takes coumadin for afib and his INR is 2.12. Pharmacy is to start bridge therapy with heparin once the INR is < 2. Pt has received 1 mg phytonadione IV and heparin will start at 2200. Pt is for OR tomorrow.  Goal of Therapy:  Heparin level 0.3-0.7 units/ml Monitor platelets by  anticoagulation protocol: Yes   Plan:  1) Will start heparin at 1000 units/hr, no bolus. 2) Check heparin level with AM labs.  Eugene Garnetotter, Journe Hallmark Sue 04/24/2013,8:21 PM

## 2013-04-25 ENCOUNTER — Inpatient Hospital Stay (HOSPITAL_COMMUNITY): Payer: Medicare Other | Admitting: Certified Registered Nurse Anesthetist

## 2013-04-25 ENCOUNTER — Encounter (HOSPITAL_COMMUNITY): Payer: Self-pay | Admitting: Certified Registered Nurse Anesthetist

## 2013-04-25 ENCOUNTER — Inpatient Hospital Stay (HOSPITAL_COMMUNITY): Payer: Medicare Other

## 2013-04-25 ENCOUNTER — Encounter (HOSPITAL_COMMUNITY)
Admission: EM | Disposition: A | Discharge status: Skilled Nursing Facility | Payer: Self-pay | Source: Home / Self Care | Attending: Internal Medicine

## 2013-04-25 ENCOUNTER — Encounter (HOSPITAL_COMMUNITY): Payer: Medicare Other | Admitting: Certified Registered Nurse Anesthetist

## 2013-04-25 DIAGNOSIS — N179 Acute kidney failure, unspecified: Secondary | ICD-10-CM

## 2013-04-25 DIAGNOSIS — I498 Other specified cardiac arrhythmias: Secondary | ICD-10-CM

## 2013-04-25 HISTORY — PX: HIP ARTHROPLASTY: SHX981

## 2013-04-25 LAB — PROTIME-INR
INR: 2.27 — ABNORMAL HIGH (ref 0.00–1.49)
INR: 2.37 — AB (ref 0.00–1.49)
Prothrombin Time: 24.3 seconds — ABNORMAL HIGH (ref 11.6–15.2)
Prothrombin Time: 25.1 seconds — ABNORMAL HIGH (ref 11.6–15.2)

## 2013-04-25 LAB — CBC
HEMATOCRIT: 39.8 % (ref 39.0–52.0)
HEMOGLOBIN: 13.4 g/dL (ref 13.0–17.0)
MCH: 33.3 pg (ref 26.0–34.0)
MCHC: 33.7 g/dL (ref 30.0–36.0)
MCV: 99 fL (ref 78.0–100.0)
Platelets: 248 10*3/uL (ref 150–400)
RBC: 4.02 MIL/uL — AB (ref 4.22–5.81)
RDW: 13.1 % (ref 11.5–15.5)
WBC: 16.1 10*3/uL — AB (ref 4.0–10.5)

## 2013-04-25 LAB — LEGIONELLA ANTIGEN, URINE: Legionella Antigen, Urine: NEGATIVE

## 2013-04-25 LAB — SURGICAL PCR SCREEN
MRSA, PCR: NEGATIVE
Staphylococcus aureus: NEGATIVE

## 2013-04-25 LAB — URINE CULTURE
COLONY COUNT: NO GROWTH
CULTURE: NO GROWTH

## 2013-04-25 LAB — EXPECTORATED SPUTUM ASSESSMENT W GRAM STAIN, RFLX TO RESP C

## 2013-04-25 LAB — EXPECTORATED SPUTUM ASSESSMENT W REFEX TO RESP CULTURE

## 2013-04-25 LAB — HEPARIN LEVEL (UNFRACTIONATED): Heparin Unfractionated: 0.47 IU/mL (ref 0.30–0.70)

## 2013-04-25 SURGERY — HEMIARTHROPLASTY, HIP, DIRECT ANTERIOR APPROACH, FOR FRACTURE
Anesthesia: General | Site: Hip | Laterality: Right

## 2013-04-25 MED ORDER — FENTANYL CITRATE 0.05 MG/ML IJ SOLN
INTRAMUSCULAR | Status: AC
  Filled 2013-04-25: qty 5

## 2013-04-25 MED ORDER — PHENYLEPHRINE HCL 10 MG/ML IJ SOLN
INTRAMUSCULAR | Status: DC | PRN
  Administered 2013-04-25 (×5): 80 ug via INTRAVENOUS

## 2013-04-25 MED ORDER — PROPOFOL 10 MG/ML IV BOLUS
INTRAVENOUS | Status: DC | PRN
  Administered 2013-04-25: 100 mg via INTRAVENOUS
  Administered 2013-04-25: 40 mg via INTRAVENOUS

## 2013-04-25 MED ORDER — EPHEDRINE SULFATE 50 MG/ML IJ SOLN
INTRAMUSCULAR | Status: DC | PRN
  Administered 2013-04-25: 10 mg via INTRAVENOUS

## 2013-04-25 MED ORDER — ONDANSETRON HCL 4 MG/2ML IJ SOLN
INTRAMUSCULAR | Status: AC
  Filled 2013-04-25: qty 2

## 2013-04-25 MED ORDER — SODIUM CHLORIDE 0.9 % IR SOLN
Status: DC | PRN
  Administered 2013-04-25: 3000 mL

## 2013-04-25 MED ORDER — ONDANSETRON HCL 4 MG PO TABS: 4.0000 mg | ORAL_TABLET | Freq: Four times a day (QID) | ORAL | Status: DC | PRN

## 2013-04-25 MED ORDER — STERILE WATER FOR INJECTION IJ SOLN
INTRAMUSCULAR | Status: AC
  Filled 2013-04-25: qty 10

## 2013-04-25 MED ORDER — MORPHINE SULFATE 2 MG/ML IJ SOLN: 0.5000 mg | INTRAMUSCULAR | Status: DC | PRN

## 2013-04-25 MED ORDER — FENTANYL CITRATE 0.05 MG/ML IJ SOLN
INTRAMUSCULAR | Status: AC
  Filled 2013-04-25: qty 2

## 2013-04-25 MED ORDER — VITAMIN K1 10 MG/ML IJ SOLN
5.0000 mg | Freq: Once | INTRAVENOUS | Status: AC
  Administered 2013-04-25: 5 mg via INTRAVENOUS
  Filled 2013-04-25: qty 0.5

## 2013-04-25 MED ORDER — OXYCODONE HCL 5 MG PO TABS
ORAL_TABLET | ORAL | Status: AC
  Filled 2013-04-25: qty 1

## 2013-04-25 MED ORDER — PHENYLEPHRINE HCL 10 MG/ML IJ SOLN
10.0000 mg | INTRAMUSCULAR | Status: DC | PRN
  Administered 2013-04-25: 30 ug/min via INTRAVENOUS

## 2013-04-25 MED ORDER — OXYCODONE HCL 5 MG PO TABS: 5.0000 mg | ORAL_TABLET | Freq: Once | ORAL | Status: DC | PRN

## 2013-04-25 MED ORDER — PHENYLEPHRINE 40 MCG/ML (10ML) SYRINGE FOR IV PUSH (FOR BLOOD PRESSURE SUPPORT)
PREFILLED_SYRINGE | INTRAVENOUS | Status: AC
  Filled 2013-04-25: qty 10

## 2013-04-25 MED ORDER — NEOSTIGMINE METHYLSULFATE 1 MG/ML IJ SOLN
INTRAMUSCULAR | Status: DC | PRN
  Administered 2013-04-25: 5 mg via INTRAVENOUS

## 2013-04-25 MED ORDER — CEFUROXIME SODIUM 1.5 G IJ SOLR
INTRAMUSCULAR | Status: DC | PRN
  Administered 2013-04-25: 1.5 g

## 2013-04-25 MED ORDER — GLYCOPYRROLATE 0.2 MG/ML IJ SOLN
INTRAMUSCULAR | Status: AC
  Filled 2013-04-25: qty 4

## 2013-04-25 MED ORDER — CEFAZOLIN SODIUM-DEXTROSE 2-3 GM-% IV SOLR
INTRAVENOUS | Status: AC
  Administered 2013-04-25: 2 g via INTRAVENOUS
  Filled 2013-04-25: qty 50

## 2013-04-25 MED ORDER — ROCURONIUM BROMIDE 100 MG/10ML IV SOLN
INTRAVENOUS | Status: DC | PRN
  Administered 2013-04-25: 30 mg via INTRAVENOUS

## 2013-04-25 MED ORDER — MENTHOL 3 MG MT LOZG: 1.0000 | LOZENGE | OROMUCOSAL | Status: DC | PRN

## 2013-04-25 MED ORDER — METOCLOPRAMIDE HCL 5 MG/ML IJ SOLN
5.0000 mg | Freq: Three times a day (TID) | INTRAMUSCULAR | Status: DC | PRN
  Administered 2013-04-26: 10 mg via INTRAVENOUS
  Filled 2013-04-25: qty 2

## 2013-04-25 MED ORDER — LIDOCAINE HCL (CARDIAC) 20 MG/ML IV SOLN
INTRAVENOUS | Status: AC
  Filled 2013-04-25: qty 5

## 2013-04-25 MED ORDER — CEFUROXIME SODIUM 1.5 G IJ SOLR
INTRAMUSCULAR | Status: AC
  Filled 2013-04-25: qty 1.5

## 2013-04-25 MED ORDER — ONDANSETRON HCL 4 MG/2ML IJ SOLN
4.0000 mg | Freq: Four times a day (QID) | INTRAMUSCULAR | Status: DC | PRN
  Administered 2013-04-26: 4 mg via INTRAVENOUS
  Filled 2013-04-25: qty 2

## 2013-04-25 MED ORDER — VITAMIN K1 1 MG/0.5ML IJ SOLN: 5.0000 mg | Freq: Once | INTRAMUSCULAR | Status: DC

## 2013-04-25 MED ORDER — LATANOPROST 0.005 % OP SOLN
1.0000 [drp] | Freq: Every day | OPHTHALMIC | Status: DC
  Administered 2013-04-25 – 2013-04-27 (×3): 1 [drp] via OPHTHALMIC
  Filled 2013-04-25: qty 2.5

## 2013-04-25 MED ORDER — ACETAMINOPHEN 650 MG RE SUPP: 650.0000 mg | Freq: Four times a day (QID) | RECTAL | Status: DC | PRN

## 2013-04-25 MED ORDER — FENTANYL CITRATE 0.05 MG/ML IJ SOLN
INTRAMUSCULAR | Status: DC | PRN
  Administered 2013-04-25: 100 ug via INTRAVENOUS
  Administered 2013-04-25: 50 ug via INTRAVENOUS

## 2013-04-25 MED ORDER — FENTANYL CITRATE 0.05 MG/ML IJ SOLN
25.0000 ug | INTRAMUSCULAR | Status: DC | PRN
  Administered 2013-04-25 (×2): 25 ug via INTRAVENOUS

## 2013-04-25 MED ORDER — BUPIVACAINE-EPINEPHRINE 0.5% -1:200000 IJ SOLN
INTRAMUSCULAR | Status: DC | PRN
  Administered 2013-04-25: 10 mL

## 2013-04-25 MED ORDER — KCL IN DEXTROSE-NACL 20-5-0.45 MEQ/L-%-% IV SOLN
INTRAVENOUS | Status: DC
  Administered 2013-04-25 – 2013-04-27 (×3): via INTRAVENOUS
  Administered 2013-04-27: 1000 mL via INTRAVENOUS
  Filled 2013-04-25 (×9): qty 1000

## 2013-04-25 MED ORDER — SUCCINYLCHOLINE CHLORIDE 20 MG/ML IJ SOLN
INTRAMUSCULAR | Status: DC | PRN
  Administered 2013-04-25: 100 mg via INTRAVENOUS

## 2013-04-25 MED ORDER — METOCLOPRAMIDE HCL 10 MG PO TABS: 5.0000 mg | ORAL_TABLET | Freq: Three times a day (TID) | ORAL | Status: DC | PRN

## 2013-04-25 MED ORDER — ACETAMINOPHEN 325 MG PO TABS
650.0000 mg | ORAL_TABLET | Freq: Four times a day (QID) | ORAL | Status: DC | PRN
  Administered 2013-04-28: 650 mg via ORAL
  Filled 2013-04-25: qty 2

## 2013-04-25 MED ORDER — ONDANSETRON HCL 4 MG/2ML IJ SOLN
INTRAMUSCULAR | Status: DC | PRN
  Administered 2013-04-25: 4 mg via INTRAVENOUS

## 2013-04-25 MED ORDER — ALBUMIN HUMAN 5 % IV SOLN
INTRAVENOUS | Status: DC | PRN
  Administered 2013-04-25: 16:00:00 via INTRAVENOUS

## 2013-04-25 MED ORDER — OXYCODONE HCL 5 MG/5ML PO SOLN: 5.0000 mg | Freq: Once | ORAL | Status: DC | PRN

## 2013-04-25 MED ORDER — ROCURONIUM BROMIDE 50 MG/5ML IV SOLN
INTRAVENOUS | Status: AC
  Filled 2013-04-25: qty 1

## 2013-04-25 MED ORDER — GLYCOPYRROLATE 0.2 MG/ML IJ SOLN
INTRAMUSCULAR | Status: DC | PRN
  Administered 2013-04-25: .8 mg via INTRAVENOUS

## 2013-04-25 MED ORDER — PROPOFOL 10 MG/ML IV BOLUS
INTRAVENOUS | Status: AC
  Filled 2013-04-25: qty 20

## 2013-04-25 MED ORDER — PHENOL 1.4 % MT LIQD: 1.0000 | OROMUCOSAL | Status: DC | PRN

## 2013-04-25 MED ORDER — LEVOFLOXACIN IN D5W 500 MG/100ML IV SOLN
500.0000 mg | INTRAVENOUS | Status: DC
  Filled 2013-04-25: qty 100

## 2013-04-25 MED ORDER — BUPIVACAINE-EPINEPHRINE (PF) 0.5% -1:200000 IJ SOLN
INTRAMUSCULAR | Status: AC
  Filled 2013-04-25: qty 10

## 2013-04-25 MED ORDER — HYDROCODONE-ACETAMINOPHEN 5-325 MG PO TABS
1.0000 | ORAL_TABLET | Freq: Four times a day (QID) | ORAL | Status: DC | PRN
  Administered 2013-04-25 – 2013-04-27 (×3): 1 via ORAL
  Filled 2013-04-25 (×3): qty 1

## 2013-04-25 MED ORDER — SODIUM CHLORIDE 0.9 % IV SOLN
INTRAVENOUS | Status: DC | PRN
  Administered 2013-04-25: 15:00:00 via INTRAVENOUS

## 2013-04-25 MED ORDER — EPHEDRINE SULFATE 50 MG/ML IJ SOLN
INTRAMUSCULAR | Status: AC
  Filled 2013-04-25: qty 1

## 2013-04-25 MED ORDER — LIDOCAINE HCL (CARDIAC) 20 MG/ML IV SOLN
INTRAVENOUS | Status: DC | PRN
  Administered 2013-04-25: 80 mg via INTRAVENOUS

## 2013-04-25 SURGICAL SUPPLY — 62 items
BLADE SAW SAG 73X25 THK (BLADE) ×2
BLADE SAW SGTL 73X25 THK (BLADE) ×1 IMPLANT
BRUSH FEMORAL CANAL (MISCELLANEOUS) ×3 IMPLANT
CAPT HIP FX BIPOLAR/UNIPOLAR ×3 IMPLANT
CEMENT BONE DEPUY (Cement) ×6 IMPLANT
CLOTH BEACON ORANGE TIMEOUT ST (SAFETY) IMPLANT
COVER BACK TABLE 24X17X13 BIG (DRAPES) IMPLANT
DRAPE ORTHO SPLIT 77X108 STRL (DRAPES) ×2
DRAPE PROXIMA HALF (DRAPES) ×3 IMPLANT
DRAPE SURG ORHT 6 SPLT 77X108 (DRAPES) ×1 IMPLANT
DRAPE U-SHAPE 47X51 STRL (DRAPES) ×3 IMPLANT
DRILL BIT 7/64X5 (BIT) ×3 IMPLANT
DRSG AQUACEL AG ADV 3.5X10 (GAUZE/BANDAGES/DRESSINGS) ×3 IMPLANT
DRSG PAD ABDOMINAL 8X10 ST (GAUZE/BANDAGES/DRESSINGS) ×3 IMPLANT
DURAPREP 26ML APPLICATOR (WOUND CARE) ×3 IMPLANT
ELECT BLADE 4.0 EZ CLEAN MEGAD (MISCELLANEOUS) ×3
ELECT BLADE 6.5 EXT (BLADE) IMPLANT
ELECT CAUTERY BLADE 6.4 (BLADE) IMPLANT
ELECT REM PT RETURN 9FT ADLT (ELECTROSURGICAL) ×3
ELECTRODE BLDE 4.0 EZ CLN MEGD (MISCELLANEOUS) ×1 IMPLANT
ELECTRODE REM PT RTRN 9FT ADLT (ELECTROSURGICAL) ×1 IMPLANT
EVACUATOR 1/8 PVC DRAIN (DRAIN) IMPLANT
GAUZE XEROFORM 5X9 LF (GAUZE/BANDAGES/DRESSINGS) ×3 IMPLANT
GLOVE BIO SURGEON STRL SZ7.5 (GLOVE) ×3 IMPLANT
GLOVE BIO SURGEON STRL SZ8.5 (GLOVE) ×3 IMPLANT
GLOVE BIOGEL PI IND STRL 8 (GLOVE) ×1 IMPLANT
GLOVE BIOGEL PI IND STRL 9 (GLOVE) ×1 IMPLANT
GLOVE BIOGEL PI INDICATOR 8 (GLOVE) ×2
GLOVE BIOGEL PI INDICATOR 9 (GLOVE) ×2
GOWN PREVENTION PLUS XLARGE (GOWN DISPOSABLE) ×6 IMPLANT
GOWN STRL NON-REIN LRG LVL3 (GOWN DISPOSABLE) ×3 IMPLANT
GOWN STRL REIN XL XLG (GOWN DISPOSABLE) ×3 IMPLANT
HANDPIECE INTERPULSE COAX TIP (DISPOSABLE) ×2
HOOD PEEL AWAY FACE SHEILD DIS (HOOD) ×6 IMPLANT
IMMOBILIZER KNEE 20 (SOFTGOODS) IMPLANT
KIT BASIN OR (CUSTOM PROCEDURE TRAY) ×3 IMPLANT
KIT ROOM TURNOVER OR (KITS) ×3 IMPLANT
MANIFOLD NEPTUNE II (INSTRUMENTS) ×3 IMPLANT
NEEDLE 1/2 CIR MAYO (NEEDLE) IMPLANT
NS IRRIG 1000ML POUR BTL (IV SOLUTION) ×3 IMPLANT
PACK TOTAL JOINT (CUSTOM PROCEDURE TRAY) ×3 IMPLANT
PAD ARMBOARD 7.5X6 YLW CONV (MISCELLANEOUS) ×6 IMPLANT
PASSER SUT SWANSON 36MM LOOP (INSTRUMENTS) ×3 IMPLANT
PRESSURIZER FEMORAL UNIV (MISCELLANEOUS) ×3 IMPLANT
SET HNDPC FAN SPRY TIP SCT (DISPOSABLE) ×1 IMPLANT
SPONGE GAUZE 4X4 12PLY (GAUZE/BANDAGES/DRESSINGS) ×3 IMPLANT
STAPLER VISISTAT 35W (STAPLE) ×3 IMPLANT
SUCTION FRAZIER TIP 10 FR DISP (SUCTIONS) IMPLANT
SUT ETHIBOND 2 V 37 (SUTURE) ×3 IMPLANT
SUT ETHILON 3 0 FSL (SUTURE) ×3 IMPLANT
SUT PASSER 2.0 195M (MISCELLANEOUS) IMPLANT
SUT VIC AB 0 CT1 27 (SUTURE) ×2
SUT VIC AB 0 CT1 27XBRD ANBCTR (SUTURE) ×1 IMPLANT
SUT VIC AB 1 CTX 36 (SUTURE) ×2
SUT VIC AB 1 CTX36XBRD ANBCTR (SUTURE) ×1 IMPLANT
SUT VIC AB 2-0 CT1 36 (SUTURE) ×3 IMPLANT
SYR CONTROL 10ML LL (SYRINGE) ×3 IMPLANT
TOWEL OR 17X24 6PK STRL BLUE (TOWEL DISPOSABLE) ×3 IMPLANT
TOWEL OR 17X26 10 PK STRL BLUE (TOWEL DISPOSABLE) ×3 IMPLANT
TOWER CARTRIDGE SMART MIX (DISPOSABLE) ×3 IMPLANT
TRAY FOLEY CATH 14FR (SET/KITS/TRAYS/PACK) IMPLANT
WATER STERILE IRR 1000ML POUR (IV SOLUTION) IMPLANT

## 2013-04-25 NOTE — Progress Notes (Addendum)
ANTICOAGULATION CONSULT NOTE - Follow Up Consult  Pharmacy Consult for heparin Indication: atrial fibrillation  Allergies  Allergen Reactions  . Diltiazem Hcl Er Other (See Comments)    Tachycardia  . Metoprolol Succinate Er [Metoprolol Tartrate] Other (See Comments)    hallucinations    Patient Measurements: Height: 5\' 8"  (172.7 cm) Weight: 186 lb 14.4 oz (84.777 kg) IBW/kg (Calculated) : 68.4 Heparin Dosing Weight: 85kg  Vital Signs: Temp: 98.6 F (37 C) (02/13 0637) BP: 135/65 mmHg (02/13 0637) Pulse Rate: 65 (02/13 0637)  Labs:  Recent Labs  04/24/13 1437 04/25/13 0700  HGB 14.1 13.4  HCT 40.4 39.8  PLT 228 248  LABPROT 23.1* 24.3*  INR 2.12* 2.27*  HEPARINUNFRC  --  0.47  CREATININE 1.75*  --     Estimated Creatinine Clearance: 28.6 ml/min (by C-G formula based on Cr of 1.75).   Assessment: 89 YOM starting on heparin drip for AFib in anticipation of surgery for hip. On warfarin PTA at dose of 2.5mg  daily except 3.75 on MWF. Admit INR was 2.12 and vitamin K 1mg  IV x1 was ordered last evening. However this was NOT given. Another 1mg  of vitamin K IV was ordered this morning. INR this morning 2.27. Heparin continued overnight. Level this morning therapeutic at 0.47. Hgb and plts stable. No bleeding noted Going for hip hemiarthroplasty today.  Goal of Therapy:  Heparin level 0.3-0.7 units/ml Monitor platelets by anticoagulation protocol: Yes   Plan:  1. Heparin has been stopped 2. Will follow up post-op for anticoagulation plans 3. Change levofloxacin to 500mg  IV q48h with first dose on 2/14 as per renal function   Madelyn Tlatelpa D. Breyton Vanscyoc, PharmD, BCPS Clinical Pharmacist Pager: [phone] 04/25/2013 10:01 AM

## 2013-04-25 NOTE — Progress Notes (Signed)
[facility] PROGRESS NOTE  Jordan [name] ZOX:[id number] DOB: Jan 27, 1919 DOA: 04/24/2013 PCP: [name],[name], MD  Assessment/Plan: Right Hip Fracture -For repair today. -Will likely need SNF afterwards.  Atrial Fibrillation -Rate controlled. -INR remains >2 despite 2 separate doses of vitamin K. -If ortho desires coumadin to be completely reversed prior to surgery, will need to consider transfusion of FFP. -Restart coumadin after surgery once ok with ortho, no need for bridging.  HTN -Continue home medications. -Fair control.  ?CAP -Afebrile/no leukocytosis. -Doubt infiltrates on CXR translate to clinical PNA. -Continue PO levaquin empirically for 7 days.  Chronic Diastolic CHF -Compensated at present.  Code Status: Full code Family Communication: Wife and daughter at bedside updated on plan of care.  Disposition Plan: SNF.   Consultants:  Ortho   Antibiotics:  Levaquin  Subjective: No complaints other than right hip pain with movement.  Objective: Filed Vitals:   04/25/13 0400 04/25/13 0500 04/25/13 0637 04/25/13 0756  BP:   135/65   Pulse:   65   Temp:   98.6 F (37 C)   Resp: 18  18   Height:      Weight:  84.777 kg (186 lb 14.4 oz)    SpO2:   99% 98%    Intake/Output Summary (Last 24 hours) at 04/25/13 1421 Last data filed at 04/25/13 0647  Gross per 24 hour  Intake    300 ml  Output      0 ml  Net    300 ml   Filed Weights   04/24/13 1429 04/24/13 1900 04/25/13 0500  Weight: 84.823 kg (187 lb) 84.823 kg (187 lb) 84.777 kg (186 lb 14.4 oz)    Exam:   General:  AA Ox3  Cardiovascular: RRR  Respiratory: CTA B  Abdomen: S/NT/ND/+BS  Extremities: right is externally rotated.   Neurologic:  Non-focal.  Data Reviewed: Basic Metabolic Panel:  Recent Labs Lab 04/24/13 1437  NA 141  K 5.3  CL 105  CO2 22  GLUCOSE 101*  BUN 35*  CREATININE 1.75*  CALCIUM 8.7   Liver Function Tests: No results found for this  basename: AST, ALT, ALKPHOS, BILITOT, PROT, ALBUMIN,  in the last 168 hours No results found for this basename: LIPASE, AMYLASE,  in the last 168 hours No results found for this basename: AMMONIA,  in the last 168 hours CBC:  Recent Labs Lab 04/24/13 1437 04/25/13 0700  WBC 15.7* 16.1*  HGB 14.1 13.4  HCT 40.4 39.8  MCV 98.8 99.0  PLT 228 248   Cardiac Enzymes: No results found for this basename: CKTOTAL, CKMB, CKMBINDEX, TROPONINI,  in the last 168 hours BNP (last 3 results)  Recent Labs  12/19/12 1615  PROBNP 669.1*   CBG: No results found for this basename: GLUCAP,  in the last 168 hours  Recent Results (from the past 240 hour(s))  CULTURE, EXPECTORATED SPUTUM-ASSESSMENT     Status: None   Collection Time    04/25/13 12:30 AM      Result Value Ref Range Status   Specimen Description SPUTUM   Final   Special Requests NONE   Final   Sputum evaluation     Final   Value: MICROSCOPIC FINDINGS SUGGEST THAT THIS SPECIMEN IS NOT REPRESENTATIVE OF LOWER RESPIRATORY SECRETIONS. PLEASE RECOLLECT.     Results Called to: Josefa HalfM Johnson, RN [phone] at 0229  Dia SitterK Wilder   Report Status 04/25/2013 FINAL   Final  SURGICAL PCR SCREEN     Status: None  Collection Time    04/25/13  6:02 AM      Result Value Ref Range Status   MRSA, PCR NEGATIVE  NEGATIVE Final   Staphylococcus aureus NEGATIVE  NEGATIVE Final   Comment:            The Xpert SA Assay (FDA     approved for NASAL specimens     in patients over 65 years of age),     is one component of     a comprehensive surveillance     program.  Test performance has     been validated by [employer] for patients greater     than or equal to 75 year old.     It is not intended     to diagnose infection nor to     guide or monitor treatment.     Studies: Dg Chest 1 View  04/24/2013   CLINICAL DATA:  [place] this morning. Right hip fracture. Preoperative respiratory evaluation. Shortness of breath.  EXAM: CHEST - 1 VIEW  COMPARISON:   DG CHEST 1V PORT dated 12/16/2012; CT CHEST W/O CM dated 06/13/2007; CT CHEST W/CM dated 12/01/2006; DG CHEST 2 VIEW dated 12/08/2004  FINDINGS: Focal opacity at the right lung base, increased since the prior examinations. The prior CT showed new interstitial fibrosis in the right lower lobe. Mild hyperinflation and mild emphysematous changes in the upper lobes, unchanged. Lungs otherwise clear. No pleural effusions. No pneumothorax. Normal pulmonary vascularity. Cardiac silhouette mildly enlarged but stable.  IMPRESSION: 1. Increased opacity at the right lung base compared to prior examinations, with baseline interstitial fibrosis in the right lower lobe. This could represent worsening fibrosis or a superimposed bronchopneumonia. Are there clinical findings to suggest pneumonia? 2. Stable COPD/emphysema. No acute cardiopulmonary disease otherwise. 3. Stable mild cardiomegaly without pulmonary edema.   Electronically Signed   By: Hulan Saas M.D.   On: 04/24/2013 16:22   Dg Hip Complete Right  04/24/2013   CLINICAL DATA:  Fall.  Right hip pain.  EXAM: RIGHT HIP - COMPLETE 2+ VIEW  COMPARISON:  None.  FINDINGS: The patient has a subcapital right hip fracture. No other acute bony or joint abnormality is identified. Bones are osteopenic.  IMPRESSION: Acute subcapital right hip fracture.   Electronically Signed   By: Drusilla Kanner M.D.   On: 04/24/2013 16:18    Scheduled Meds: . atenolol  25 mg Oral Daily  . budesonide (PULMICORT) nebulizer solution  0.25 mg Nebulization BID  . chlorhexidine  60 mL Topical Once  . flecainide  50 mg Oral BID  . [START ON 04/26/2013] levofloxacin (LEVAQUIN) IV  500 mg Intravenous Q48H  . levothyroxine  50 mcg Oral QAC breakfast  . pantoprazole  40 mg Oral Daily  . phytonadione (VITAMIN K) IV  1 mg Intravenous Once   Continuous Infusions:   Principal Problem:   Closed right hip fracture Active Problems:   Hypertension   Hypothyroidism   Atrial fibrillation   COPD  (chronic obstructive pulmonary disease)   Chronic anticoagulation   Chronic diastolic heart failure   Chronic kidney disease (CKD) stage G3a/A1, moderately decreased glomerular filtration rate (GFR) between 45-59 mL/min/1.73 square meter and albuminuria creatinine ratio less than 30 mg/g   CAP (community acquired pneumonia)   Leukocytosis    Time spent: 35 minutes. Greater than 50% of this time was spent in direct contact with the patient coordinating care.    HERNANDEZ ACOSTA,Anjana Cheek  [facility]  Pager [phone]  If 7PM-7AM, please contact night-coverage at [email or web address], password [facility] 04/25/2013, 2:21 PM  LOS: 1 day

## 2013-04-25 NOTE — Anesthesia Procedure Notes (Addendum)
Procedure Name: Intubation Date/Time: 04/25/2013 3:30 PM Performed by: Vita BarleyURNER, Teressa Mcglocklin E Pre-anesthesia Checklist: Patient identified, Emergency Drugs available, Suction available and Patient being monitored Patient Re-evaluated:Patient Re-evaluated prior to inductionOxygen Delivery Method: Circle system utilized Preoxygenation: Pre-oxygenation with 100% oxygen Intubation Type: Rapid sequence Laryngoscope Size: Hyacinth MeekerMiller and 2 Grade View: Grade I Tube type: Oral Tube size: 7.5 mm Number of attempts: 1 Airway Equipment and Method: Stylet Placement Confirmation: ETT inserted through vocal cords under direct vision,  positive ETCO2 and breath sounds checked- equal and bilateral Secured at: 23 cm Tube secured with: Tape Dental Injury: Teeth and Oropharynx as per pre-operative assessment

## 2013-04-25 NOTE — Anesthesia Preprocedure Evaluation (Addendum)
Anesthesia Evaluation  Patient identified by MRN, date of birth, ID band Patient awake    Reviewed: Allergy & Precautions, H&P , NPO status , Patient's Chart, lab work & pertinent test results  Airway Mallampati: I TM Distance: >3 FB Neck ROM: Full    Dental  (+) Dental Advisory Given   Pulmonary shortness of breath and with exertion, COPD + rhonchi         Cardiovascular hypertension, Pt. on medications +CHF + dysrhythmias Atrial Fibrillation Rhythm:Irregular Rate:Normal  bigeminy   Neuro/Psych    GI/Hepatic GERD-  Medicated,  Endo/Other  Hypothyroidism   Renal/GU Renal InsufficiencyRenal disease     Musculoskeletal   Abdominal   Peds  Hematology   Anesthesia Other Findings   Reproductive/Obstetrics                        Anesthesia Physical Anesthesia Plan  ASA: III  Anesthesia Plan: General   Post-op Pain Management:    Induction: Intravenous, Rapid sequence and Cricoid pressure planned  Airway Management Planned: Oral ETT  Additional Equipment:   Intra-op Plan:   Post-operative Plan: Extubation in OR  Informed Consent: I have reviewed the patients History and Physical, chart, labs and discussed the procedure including the risks, benefits and alternatives for the proposed anesthesia with the patient or authorized representative who has indicated his/her understanding and acceptance.   Dental advisory given  Plan Discussed with: CRNA, Surgeon and Anesthesiologist  Anesthesia Plan Comments:       Anesthesia Quick Evaluation

## 2013-04-25 NOTE — Op Note (Signed)
Pre Op Dx: Right femoral neck fracture   Post Op Dx: Same   Procedure: Right hemi-hip arthroplasty using DePuy 53 mm monopolar head, +0 neck, #6 Summit basic stem, 12 mm tip, #4 cement restrictor, double batch Depuy 1 cement with 1500 mg of Zinacef.  Surgeon: Nestor LewandowskyFrank J Rynlee Lisbon, MD  Assistant: Tomi LikensEric K. Gaylene BrooksPhillips PA-C  (present throughout entire procedure and necessary for timely completion of the procedure)  Anesthesia: General  EBL: 300 cc  Fluids: 1500 cc of crystalloid  Tourniquet Time: Not applicable  Indications: Independent living patient slipped and fell at home on the evening of 07/29/2011. Was transported to the emergency room with a presumed right hip subcapital femoral neck fracture confirmed by x-ray. To decrease pain increase function and decreased morbidity have recommended and the patient has consented to hemi-hip arthroplasty. Risks and benefits of surgery have been discussed with the patient and family. All questions have been answered.  Procedure: Patient was identified by arm band receive preoperative IV antibiotics in the holding area at the hospital. She was taken to operating room for the appropriate anesthetic monitors were attached and general LMA anesthesia induced with the patient in the supine position. He was then rolled into the left lateral decubitus position and fixed there with a pelvic clamp. The right lower extremity was then prepped and draped in usual sterile fashion from the ankle to the hemipelvis and a timeout procedure performed. A 12 cm incision centered over the greater trochanter allowing a lateral to posterior lateral approach to the hip was then made through the skin and subcutaneous tissue. The IT band was cut in line with skin incision at which point we encountered hematoma. Cobra retractors were placed on the superior hip joint capsule and along the inferior hip joint capsule as well. The Piriformis and short external rotators were tagged and cut after insertion  on the intertrochanteric crest and a posterior capsular flap was developed going posterior superior off the acetabulum out over the fractured femoral neck exiting posterior inferior. This exposed the fracture, the hip was internally rotated and a standard neck cut was performed with an oscillating saw 1 fingerbreadth above the lesser trochanter. This allowed us to extracted the femoral head, which measured at 53 mm. Trial reductions were then performed with a 52 and 53 mm trial head on a stick and the 53 mm head had the best fit and fill. There was then flexed and internally rotated exposing the proximal femur which was entered with the initiating reamer, the lateral reamer and then broaches up to a #6 broach which had the best fit and fill to the neck cut. The neck cut was then finished with the neck reamer. And a +0 53 mm trial head was placed on the broach, the hip reduced and stability was found to be excellent he could flex to 90 and 75 of internal rotation and in full extension the hip could not be dislocated in external rotation. We then size for a #4 cement restrictor which was inserted without difficulty and the proximal femur was pulse device clean and dried with suction and sponges. A double batch of DePuy 1 cement was then mixed with 1500 mg of Zinacef and injected into the proximal femur under pressure followed by a #6 Summit basic stem with a 12 mm cementralizer. The stem was held in compression and about 15 of anteversion as the cement cured. A +0 53 mm monopolar head was then hammered onto the stem the hip reduced and  stability again noted to be excellent. The wound is thoroughly irrigated out normal saline solution. Capsular flap and short external rotators repaired back to the intertrochanteric crest a drill holes with a #2 Ethibond suture. The IT band was closed with running #1 Vicryl suture, the subcutaneous tissue with 0 and 2-0 undyed Vicryl suture and the skin with running interlocking 3-0  nylon suture. A dressing of Mepilex was then applied. The patient was unclamped rolled supine, awakened extubated and taken to the recovery room without difficulty.

## 2013-04-25 NOTE — Progress Notes (Signed)
PATIENT ID: Jordan [name]   Day of Surgery Procedure(s) (LRB): ARTHROPLASTY BIPOLAR HIP (Right)  Subjective: he is not having any pain unless he moves.  Objective:  Filed Vitals:   04/25/13 0637  BP: 135/65  Pulse: 65  Temp: 98.6 F (37 C)  Resp: 18     Right hip skin intact.  Pain with any movement.  Distally neurovascularly intact.  Labs:   Recent Labs  04/24/13 1437 04/25/13 0700  HGB 14.1 13.4   Recent Labs  04/24/13 1437 04/25/13 0700  WBC 15.7* 16.1*  RBC 4.09* 4.02*  HCT 40.4 39.8  PLT 228 248   Recent Labs  04/24/13 1437  NA 141  K 5.3  CL 105  CO2 22  BUN 35*  CREATININE 1.75*  GLUCOSE 101*  CALCIUM 8.7    Assessment and Plan:right displaced femoral neck fracture Plan right hip hemiarthroplasty today with Dr. Turner Danielsowan. N.p.o. For surgery.  VTE proph: SCDs, Coumadin held

## 2013-04-25 NOTE — Transfer of Care (Signed)
Immediate Anesthesia Transfer of Care Note  Patient: Jordan [name]  Procedure(s) Performed: Procedure(s): ARTHROPLASTY BIPOLAR HIP (Right)  Patient Location: PACU  Anesthesia Type:General  Level of Consciousness: awake and alert   Airway & Oxygen Therapy: Patient Spontanous Breathing and Patient connected to nasal cannula oxygen  Post-op Assessment: Report given to PACU RN, Post -op Vital signs reviewed and stable and Patient moving all extremities X 4  Post vital signs: Reviewed and stable  Complications: No apparent anesthesia complications

## 2013-04-25 NOTE — Preoperative (Addendum)
Beta Blockers   Reason not to administer Beta Blockers:Not Applicable, planning to give patient beta blocker intraoperatively, but unable to do so due to low blood pressure.

## 2013-04-26 ENCOUNTER — Inpatient Hospital Stay (HOSPITAL_COMMUNITY): Payer: Medicare Other | Admitting: Anesthesiology

## 2013-04-26 ENCOUNTER — Encounter (HOSPITAL_COMMUNITY): Payer: Self-pay | Admitting: Anesthesiology

## 2013-04-26 ENCOUNTER — Encounter (HOSPITAL_COMMUNITY)
Admission: EM | Disposition: A | Discharge status: Skilled Nursing Facility | Payer: Self-pay | Source: Home / Self Care | Attending: Internal Medicine

## 2013-04-26 ENCOUNTER — Encounter (HOSPITAL_COMMUNITY): Payer: Medicare Other | Admitting: Anesthesiology

## 2013-04-26 DIAGNOSIS — T18108A Unspecified foreign body in esophagus causing other injury, initial encounter: Secondary | ICD-10-CM

## 2013-04-26 DIAGNOSIS — S72009A Fracture of unspecified part of neck of unspecified femur, initial encounter for closed fracture: Secondary | ICD-10-CM

## 2013-04-26 DIAGNOSIS — T18128A Food in esophagus causing other injury, initial encounter: Secondary | ICD-10-CM

## 2013-04-26 DIAGNOSIS — I10 Essential (primary) hypertension: Secondary | ICD-10-CM

## 2013-04-26 HISTORY — PX: ESOPHAGOGASTRODUODENOSCOPY (EGD) WITH ESOPHAGEAL DILATION: SHX5812

## 2013-04-26 HISTORY — PX: ESOPHAGOGASTRODUODENOSCOPY: SHX5428

## 2013-04-26 LAB — BASIC METABOLIC PANEL
BUN: 34 mg/dL — AB (ref 6–23)
CALCIUM: 8.1 mg/dL — AB (ref 8.4–10.5)
CO2: 23 meq/L (ref 19–32)
CREATININE: 1.94 mg/dL — AB (ref 0.50–1.35)
Chloride: 106 mEq/L (ref 96–112)
GFR calc Af Amer: 33 mL/min — ABNORMAL LOW (ref >90)
GFR, EST NON AFRICAN AMERICAN: 28 mL/min — AB (ref >90)
Glucose, Bld: 119 mg/dL — ABNORMAL HIGH (ref 70–99)
Potassium: 5.2 mEq/L (ref 3.7–5.3)
Sodium: 139 mEq/L (ref 137–147)

## 2013-04-26 LAB — CBC
HCT: 35.1 % — ABNORMAL LOW (ref 39.0–52.0)
Hemoglobin: 11.5 g/dL — ABNORMAL LOW (ref 13.0–17.0)
MCH: 33 pg (ref 26.0–34.0)
MCHC: 32.8 g/dL (ref 30.0–36.0)
MCV: 100.9 fL — ABNORMAL HIGH (ref 78.0–100.0)
Platelets: 201 10*3/uL (ref 150–400)
RBC: 3.48 MIL/uL — ABNORMAL LOW (ref 4.22–5.81)
RDW: 13.4 % (ref 11.5–15.5)
WBC: 13.5 10*3/uL — ABNORMAL HIGH (ref 4.0–10.5)

## 2013-04-26 LAB — HEPARIN LEVEL (UNFRACTIONATED): Heparin Unfractionated: <0.1 IU/mL — ABNORMAL LOW (ref 0.30–0.70)

## 2013-04-26 LAB — PROTIME-INR
INR: 1.24 (ref 0.00–1.49)
Prothrombin Time: 15.3 seconds — ABNORMAL HIGH (ref 11.6–15.2)

## 2013-04-26 SURGERY — ESOPHAGOGASTRODUODENOSCOPY (EGD) WITH ESOPHAGEAL DILATION
Anesthesia: General

## 2013-04-26 SURGERY — EGD (ESOPHAGOGASTRODUODENOSCOPY)
Anesthesia: Moderate Sedation

## 2013-04-26 MED ORDER — FENTANYL CITRATE 0.05 MG/ML IJ SOLN
INTRAMUSCULAR | Status: AC
  Filled 2013-04-26: qty 2

## 2013-04-26 MED ORDER — HYDROCODONE-ACETAMINOPHEN 5-325 MG PO TABS: 1.0000 | ORAL_TABLET | Freq: Four times a day (QID) | ORAL | Status: DC | PRN

## 2013-04-26 MED ORDER — ONDANSETRON HCL 4 MG/2ML IJ SOLN
INTRAMUSCULAR | Status: DC | PRN
  Administered 2013-04-26: 4 mg via INTRAVENOUS

## 2013-04-26 MED ORDER — TIZANIDINE HCL 2 MG PO CAPS: 2.0000 mg | ORAL_CAPSULE | Freq: Three times a day (TID) | ORAL | Status: DC

## 2013-04-26 MED ORDER — WARFARIN SODIUM 2.5 MG PO TABS: ORAL_TABLET | ORAL | Status: DC

## 2013-04-26 MED ORDER — BUTAMBEN-TETRACAINE-BENZOCAINE 2-2-14 % EX AERO
INHALATION_SPRAY | CUTANEOUS | Status: DC | PRN
  Administered 2013-04-26: 2 via TOPICAL

## 2013-04-26 MED ORDER — WARFARIN SODIUM 2.5 MG PO TABS
2.5000 mg | ORAL_TABLET | Freq: Once | ORAL | Status: DC
  Filled 2013-04-26: qty 1

## 2013-04-26 MED ORDER — SODIUM CHLORIDE 0.9 % IV SOLN
INTRAVENOUS | Status: DC
  Administered 2013-04-26: 250 mL via INTRAVENOUS

## 2013-04-26 MED ORDER — LEVOFLOXACIN 500 MG PO TABS
500.0000 mg | ORAL_TABLET | ORAL | Status: DC
Start: 2013-04-26 — End: 2013-04-28
  Administered 2013-04-27: 500 mg via ORAL
  Filled 2013-04-26 (×2): qty 1

## 2013-04-26 MED ORDER — PANTOPRAZOLE SODIUM 40 MG IV SOLR
40.0000 mg | Freq: Two times a day (BID) | INTRAVENOUS | Status: DC
  Administered 2013-04-26 – 2013-04-28 (×5): 40 mg via INTRAVENOUS
  Filled 2013-04-26 (×6): qty 40

## 2013-04-26 MED ORDER — LIDOCAINE HCL (CARDIAC) 20 MG/ML IV SOLN
INTRAVENOUS | Status: DC | PRN
  Administered 2013-04-26: 40 mg via INTRAVENOUS

## 2013-04-26 MED ORDER — DROPERIDOL 2.5 MG/ML IJ SOLN
0.6250 mg | INTRAMUSCULAR | Status: DC | PRN
  Filled 2013-04-26: qty 0.25

## 2013-04-26 MED ORDER — MIDAZOLAM HCL 5 MG/ML IJ SOLN
INTRAMUSCULAR | Status: AC
  Filled 2013-04-26: qty 2

## 2013-04-26 MED ORDER — ALBUTEROL SULFATE HFA 108 (90 BASE) MCG/ACT IN AERS
INHALATION_SPRAY | RESPIRATORY_TRACT | Status: DC | PRN
  Administered 2013-04-26: 2 via RESPIRATORY_TRACT

## 2013-04-26 MED ORDER — EPHEDRINE SULFATE 50 MG/ML IJ SOLN
INTRAMUSCULAR | Status: DC | PRN
  Administered 2013-04-26: 5 mg via INTRAVENOUS

## 2013-04-26 MED ORDER — WARFARIN SODIUM 2.5 MG PO TABS
2.5000 mg | ORAL_TABLET | Freq: Once | ORAL | Status: AC
  Administered 2013-04-27: 2.5 mg via ORAL
  Filled 2013-04-26: qty 1

## 2013-04-26 MED ORDER — WARFARIN - PHARMACIST DOSING INPATIENT: Freq: Every day | Status: DC

## 2013-04-26 MED ORDER — FENTANYL CITRATE 0.05 MG/ML IJ SOLN: 25.0000 ug | INTRAMUSCULAR | Status: DC | PRN

## 2013-04-26 MED ORDER — SUCCINYLCHOLINE CHLORIDE 20 MG/ML IJ SOLN
INTRAMUSCULAR | Status: DC | PRN
  Administered 2013-04-26: 100 mg via INTRAVENOUS

## 2013-04-26 MED ORDER — MIDAZOLAM HCL 10 MG/2ML IJ SOLN
INTRAMUSCULAR | Status: DC | PRN
  Administered 2013-04-26 (×2): .5 mg via INTRAVENOUS

## 2013-04-26 MED ORDER — PROPOFOL 10 MG/ML IV BOLUS
INTRAVENOUS | Status: DC | PRN
  Administered 2013-04-26: 60 mg via INTRAVENOUS
  Administered 2013-04-26: 40 mg via INTRAVENOUS

## 2013-04-26 MED ORDER — LACTATED RINGERS IV SOLN
INTRAVENOUS | Status: DC | PRN
  Administered 2013-04-26: 20:00:00 via INTRAVENOUS

## 2013-04-26 NOTE — Progress Notes (Signed)
Patient's sats going to 90-91% when endoscope inserted.  Large food impaction present and only small portion removed.  Dr. Matthias HughsBuccini has called OR to try and get patient done in OR with general anesthesia.  Family informed by Dr. Matthias HughsBuccini.  Patient stable at this point.  Vitals are stable at this point see doc flow.

## 2013-04-26 NOTE — Evaluation (Signed)
Physical Therapy Evaluation Patient Details Name: Jordan [name] MRN: [id number] DOB: 1918-11-10 Today's Date: 04/26/2013 Time: [id number] PT Time Calculation (min): 27 min  PT Assessment / Plan / Recommendation History of Present Illness  s/p R THA  Clinical Impression  Pt presents with decreased strength and mobility, requires min/mod A for mobility and transfers and will benefit from acute PT services to address deficits for d/c to SNF for further rehabilitation.    PT Assessment  Patient needs continued PT services    Follow Up Recommendations  SNF    Does the patient have the potential to tolerate intense rehabilitation      Barriers to Discharge        Equipment Recommendations  None recommended by PT    Recommendations for Other Services     Frequency 7X/week    Precautions / Restrictions Precautions Precautions: Posterior Hip Precaution Booklet Issued: Yes (comment) Precaution Comments: reviewed posterior hip precautions with pt/family Restrictions Weight Bearing Restrictions: Yes RLE Weight Bearing: Weight bearing as tolerated   Pertinent Vitals/Pain No c/o pain.  spO2 on 2L during treatment: 88% initially with supine to sit and sit to stand, returns to >90% with 1 minute in position      Mobility  Bed Mobility Overal bed mobility: Needs Assistance Bed Mobility: Supine to Sit Supine to sit: Min assist General bed mobility comments: assist for R LE and to move hips to edge of bed Transfers Overall transfer level: Needs assistance Equipment used: Rolling walker (2 wheeled) Transfers: Sit to/from [employer]GI CorporationStand;Stand Pivot Transfers Sit to Stand: Mod assist Stand pivot transfers: Min assist General transfer comment: lfiting assist, cues for hand placement. once pt up he was able to turn to sit in recliner with min A, cues to use UEs to take pressure off of R LE    Exercises     PT Diagnosis: Difficulty walking;Generalized weakness  PT Problem List: Decreased  strength;Decreased range of motion;Decreased activity tolerance;Decreased mobility;Decreased knowledge of use of DME;Cardiopulmonary status limiting activity PT Treatment Interventions: DME instruction;Balance training;Modalities;Gait training;Neuromuscular re-education;Stair training;Functional mobility training;Patient/family education;Therapeutic activities;Wheelchair mobility training;Therapeutic exercise     PT Goals(Current goals can be found in the care plan section) Acute Rehab PT Goals Patient Stated Goal: get stronger PT Goal Formulation: With patient Time For Goal Achievement: 05/03/13 Potential to Achieve Goals: Good  Visit Information  Last PT Received On: 04/26/13 Assistance Needed: +1 History of Present Illness: s/p R THA       Prior Functioning  Home Living Family/patient expects to be discharged to:: Skilled nursing facility Prior Function Level of Independence: Independent with assistive device(s) Comments: used a SPC Communication Communication: No difficulties    Cognition  Cognition Arousal/Alertness: Awake/alert Behavior During Therapy: WFL for tasks assessed/performed Overall Cognitive Status: Within Functional Limits for tasks assessed    Extremity/Trunk Assessment Upper Extremity Assessment Upper Extremity Assessment: Generalized weakness Lower Extremity Assessment Lower Extremity Assessment: RLE deficits/detail RLE Deficits / Details: grossly 3-/5 strength   Balance    End of Session PT - End of Session Equipment Utilized During Treatment: Gait belt;Oxygen Activity Tolerance: Patient limited by fatigue Patient left: in chair;with call bell/phone within reach;with family/visitor present Nurse Communication: Mobility status  GP     Jordan [name] 04/26/2013, 10:34 AM

## 2013-04-26 NOTE — Progress Notes (Signed)
Repeat endoscopy under general anesthesia resulted in successful relief of his esophageal food impaction, without apparent complications. He had a moderately tight esophageal ring that was dilated to 15 mm.  Please see dictated operative report for further findings and recommendations.  Florencia Reasonsobert V. Jaclyn Andy, M.D. [phone]

## 2013-04-26 NOTE — Transfer of Care (Signed)
Immediate Anesthesia Transfer of Care Note  Patient: Jordan [name]  Procedure(s) Performed: Procedure(s): ESOPHAGOGASTRODUODENOSCOPY (EGD) WITH ESOPHAGEAL DILATION (N/A)  Patient Location: PACU  Anesthesia Type:General  Level of Consciousness: sedated, patient cooperative and responds to stimulation  Airway & Oxygen Therapy: Patient Spontanous Breathing and Patient connected to nasal cannula oxygen  Post-op Assessment: Report given to PACU RN, Post -op Vital signs reviewed and stable, Patient moving all extremities and Patient moving all extremities X 4  Post vital signs: Reviewed and stable  Complications: No apparent anesthesia complications

## 2013-04-26 NOTE — Progress Notes (Signed)
Initial endoscopic attempts showed a significant food impaction, but were only partially successful in removing the large amount of amorphous food debris present in the esophagus.  For patient safety, it was felt that since it is anticipated that this will be a fairly lengthy procedure, it was best for patient safety to terminate the procedure and reattempt  it under anesthesia support, probable general anesthesia with intubation and airway protection.  Florencia Reasonsobert V. Coleson Kant, M.D. [phone]

## 2013-04-26 NOTE — Anesthesia Preprocedure Evaluation (Signed)
Anesthesia Evaluation  Patient identified by MRN, date of birth, ID band Patient awake    Reviewed: Allergy & Precautions, H&P , NPO status , Patient's Chart, lab work & pertinent test results, reviewed documented beta blocker date and time   History of Anesthesia Complications Negative for: history of anesthetic complications  Airway Mallampati: II TM Distance: >3 FB Neck ROM: Full    Dental  (+) Edentulous Upper, Edentulous Lower   Pulmonary shortness of breath, COPD COPD inhaler,    + wheezing      Cardiovascular hypertension, Pt. on medications and Pt. on home beta blockers + dysrhythmias (h/o freq PVCs and bigeminy, afib/flutter) Atrial Fibrillation Rhythm:Regular Rate:Normal  '12 ECHO: normal LVF with mild-mod MR   Neuro/Psych negative neurological ROS     GI/Hepatic Neg liver ROS, GERD-  ,Food impaction   Endo/Other  Hypothyroidism   Renal/GU Renal InsufficiencyRenal disease (creat 1.94, K+ 5.2)     Musculoskeletal   Abdominal (+) + obese,   Peds  Hematology  (+) Blood dyscrasia (INR 1.24), ,   Anesthesia Other Findings   Reproductive/Obstetrics                           Anesthesia Physical Anesthesia Plan  ASA: III  Anesthesia Plan: General   Post-op Pain Management:    Induction: Intravenous  Airway Management Planned: Oral ETT  Additional Equipment:   Intra-op Plan:   Post-operative Plan: Extubation in OR  Informed Consent:   Plan Discussed with: CRNA and Surgeon  Anesthesia Plan Comments: (Plan routine monitors, GETA)        Anesthesia Quick Evaluation

## 2013-04-26 NOTE — Preoperative (Signed)
Beta Blockers   Reason not to administer Beta Blockers:Not Applicable 

## 2013-04-26 NOTE — Progress Notes (Signed)
PATIENT ID: Jordan [name]  MRN: [id number]  DOB/AGE:  April 18, 1918 / 80 y.o.  1 Day Post-Op Procedure(s) (LRB): ARTHROPLASTY BIPOLAR HIP (Right)    PROGRESS NOTE Subjective: Patient is alert, oriented,no Nausea, no Vomiting, yes passing gas, no Bowel Movement. Taking PO with some discomfort.  Pt states that he has a hiatal hernia and feels gastric pressure Denies SOB, Chest or Calf Pain. Using Incentive Spirometer, PAS in place. Ambulate WBAT Patient reports pain as moderate  .    Objective: Vital signs in last 24 hours: Filed Vitals:   04/26/13 0529 04/26/13 0532 04/26/13 0624 04/26/13 0811  BP: 117/54     Pulse: 73     Temp: 98 F (36.7 C)     TempSrc: Oral     Resp: 18   16  Height:      Weight:  88.179 kg (194 lb 6.4 oz)    SpO2: 98%  98% 95%      Intake/Output from previous day: I/O last 3 completed shifts: In: 2020 [P.O.:200; I.V.:1370; IV Piggyback:450] Out: 1110 [Urine:910; Blood:200]   Intake/Output this shift:     LABORATORY DATA:  Recent Labs  04/24/13 1437 04/25/13 0700 04/25/13 1105 04/26/13 0511  WBC 15.7* 16.1*  --  13.5*  HGB 14.1 13.4  --  11.5*  HCT 40.4 39.8  --  35.1*  PLT 228 248  --  201  NA 141  --   --  139  K 5.3  --   --  5.2  CL 105  --   --  106  CO2 22  --   --  23  BUN 35*  --   --  34*  CREATININE 1.75*  --   --  1.94*  GLUCOSE 101*  --   --  119*  INR 2.12* 2.27* 2.37*  --   CALCIUM 8.7  --   --  8.1*    Examination: Neurologically intact ABD soft Neurovascular intact Sensation intact distally Intact pulses distally Dorsiflexion/Plantar flexion intact Incision: scant drainage No cellulitis present Compartment soft} XR AP&Lat of hip shows well placed\fixed THA  Assessment:   1 Day Post-Op Procedure(s) (LRB): ARTHROPLASTY BIPOLAR HIP (Right) ADDITIONAL DIAGNOSIS:  Hypertension, Cardiac Arrythmia AFIB and chronic diastolic CHF.  Epigastric pressure - pt states that he has a history of hiatal hernia.  Plan: PT/OT  WBAT, THA  posterior precautions  DVT Prophylaxis: SCDx72 hrs, Restart pre-op coumadin dose.  DISCHARGE PLAN: Skilled Nursing Facility/Rehab when pt passes PT goals and is cleared by medicine.  DISCHARGE NEEDS: HHPT, HHRN, Walker and 3-in-1 comode seat

## 2013-04-26 NOTE — Op Note (Signed)
[facility] [address] [place], [id number]   ENDOSCOPY PROCEDURE REPORT  PATIENT: Jordan [name], Jordan B.  MR#: [id number] BIRTHDATE: 10-18-1918 , 89  yrs. old GENDER: Male ENDOSCOPIST:Satchel Heidinger, MD REFERRED BY:  Dr. Windle GuardWilson Elkins PROCEDURE DATE:  04/26/2013 PROCEDURE:      Esophagoscopy ASA CLASS: INDICATIONS:   Dysphagia, possible food impaction. This elderly, medically ill individual fell and broke his hip shortly after eating lunch 48 hours ago. His anticoagulation was reversed, his hip postoperatively repaired yesterday, and he was started back on anticoagulation but his INR today is normal. He has been unable to keep down any liquid or solid food he tries to eat. He has a known history of an esophageal stricture, last dilated to 15 mm 7 years ago. There is suspicion of a food impaction. MEDICATION:    Versed 1 mg IV TOPICAL ANESTHETIC:    Cetacaine spray x2  DESCRIPTION OF PROCEDURE:   the patient was brought from his hospital room and I discussed the nature, purpose, and risks of the procedure with the patient and 3 of his daughters. Written consent was provided, time out was performed, and the patient was given. Minimal sedation with Versed 1 mg IV, and did not have any overt clinical instability throughout this 25 minute procedure.  The Pentax video endoscope was passed under direct vision. The hypopharyngeal tissues appeared somewhat boggy and the vocal cords were not well seen. The esophagus was entered without significant difficulty under direct vision.  The proximal esophagus was normal, but upon advancing the scope, I encountered a large amount of food debris, occupying probably the distal quarter or third of the esophagus.  Approximately 4 passes were made with the [facility] retrieval basket, and as well as one or 2 with the tripod grasping forceps. This removed a small to moderate amount of fairly amorphous food debris.  However, the  patient was having quite a few secretions, and periodically, his oxygen saturation would dip into the high 80s (for the most part, during the procedure, it was running in the low 90s). It was felt that this would have to be a rather lengthy procedure because there was no large, single bolus that could be removed in one pass, but rather, there were going to need to be multiple passes to remove small amounts of food at a time. Taking into account the patient's age, COPD, and issues with oral secretions, I decided that it would be in the patient's best interest to terminate the procedure and proceed to a repeat attempt with anesthesia support, probably under general anesthesia with intubation and airway protection.  Therefore, the scope was removed from the patient, who overall tolerated the procedure reasonably well but I did not want to "push my luck."     COMPLICATIONS: None  ENDOSCOPIC IMPRESSION:  Food impaction, only partially removed during this 25 minute procedure.  RECOMMENDATIONS:  Repeat endoscopy, with anesthesia support, possible intubation and general anesthesia, for hopefully definitive removal of the residual food impaction, and possible conservative dilatation.  I have discussed all of this with the patient's family members and with the patient himself and all seem to be in agreement. They recognize the constraints under which we are working in terms of the patient's age and clinical status.    _______________________________ eSignedBernette Redbird:  Mila Pair, MD 04/26/2013 7:11 PM    PATIENT NAME:  Jordan [name], Jordan B. MR#: [id number]

## 2013-04-26 NOTE — Progress Notes (Signed)
Orthopedic Tech Progress Note Patient Details:  Jordan [name] 12-13-1918 [id number]  Ortho Devices Ortho Device/Splint Location: trapeze bar patient helper Ortho Device/Splint Interventions: Application   Nikki Domrawford, Waver Dibiasio 04/26/2013, 1:21 PM

## 2013-04-26 NOTE — Progress Notes (Signed)
ANTICOAGULATION CONSULT NOTE - Follow Up Consult  Pharmacy Consult for Warfarin Indication: atrial fibrillation  Allergies  Allergen Reactions  . Diltiazem Hcl Er Other (See Comments)    Tachycardia  . Metoprolol Succinate Er [Metoprolol Tartrate] Other (See Comments)    hallucinations   Patient Measurements: Height: 5\' 8"  (172.7 cm) Weight: 194 lb 6.4 oz (88.179 kg) IBW/kg (Calculated) : 68.4 Heparin Dosing Weight: 85kg  Vital Signs: Temp: 98 F (36.7 C) (02/14 0529) Temp src: Oral (02/14 0529) BP: 117/54 mmHg (02/14 0529) Pulse Rate: 73 (02/14 0529)  Labs:  Recent Labs  04/24/13 1437 04/25/13 0700 04/25/13 1105 04/26/13 0511  HGB 14.1 13.4  --  11.5*  HCT 40.4 39.8  --  35.1*  PLT 228 248  --  201  LABPROT 23.1* 24.3* 25.1*  --   INR 2.12* 2.27* 2.37*  --   HEPARINUNFRC  --  0.47  --  <0.10*  CREATININE 1.75*  --   --  1.94*   Estimated Creatinine Clearance: 26.2 ml/min (by C-G formula based on Cr of 1.94).  Assessment: 89 YOM started on IV heparin drip for AFib in anticipation of surgery for hip while his Warfarin therapy was put on hold. He is now s/p OR and heparin is off and plans to resume his warfarin today.  He has some post-op anemia but H/H and platelets are stable and no noted bleeding complications.  He received 5mg  IV phytonadione at ~ 12N yesterday.  He does not have a protime on labs this morning but I suspect he is < 2.0 after 5mg  dose of Vit. K.  HOME Dose:  Warfarin 2.5mg  daily except 3.75 on MWF. Admit INR was 2.12   Drug/Drug Interactions:  Anticoagulants / Quinolones Hypoprothrombinemic effects of warfarin may be increased by levofloxacin. Dosage reduction of warfarin may be required.  Goal of Therapy:  INR 2.0-3.0 Monitor platelets by anticoagulation protocol: Yes   Plan:  1.  Will restart him with Warfarin 2.5mg  PO x 1 today 2.  F/U AM PT/INR 3.  Change his Levofloxacin to oral therapy for course completion - need to determine LOT 4.   Monitor for bleeding complications  Nadara MustardNita Jalisia Puchalski, PharmD., MS Clinical Pharmacist Pager:  [phone] Thank you for allowing pharmacy to be part of this patients care team. 04/26/2013 9:38 AM

## 2013-04-26 NOTE — Op Note (Signed)
[facility] [address] [place], [id number]   ENDOSCOPY PROCEDURE REPORT  PATIENT: Jordan [name], Jordan B.  MR#: [id number] BIRTHDATE: 09-20-1918 , 89  yrs. old GENDER: Male ENDOSCOPIST:Mackayla Mullins, MD REFERRED BY:  Dr. Windle GuardWilson Elkins PROCEDURE DATE:  04/26/2013 PROCEDURE:      Upper endoscopy, removal of foreign body, and esophageal dilation with through-the-scope balloon ASA CLASS: INDICATIONS:   food impaction, only partially removed during esophagoscopy earlier this evening under conscious sedation. The patient was felt to be too tenuous for a prolonged procedure without anesthesia support, so this is a repeat procedure under general anesthesia MEDICATION:    general anesthesia by anesthesia department TOPICAL ANESTHETIC:    none (received Cetacaine spray prior to previous procedure earlier this evening)  DESCRIPTION OF PROCEDURE:   the procedure was done in the endoscopy unit, approximately an hour after his previous endoscopic procedure. He had been brought from his hospital room as an inpatient, one day status post hip surgery for a fractured hip, and written consent was provided by a family member. The patient was given general anesthesia and intubated, and time out was performed.  The Pentax therapeutic upper endoscope was passed under direct vision alongside his endotracheal tube, entering the esophagus with ease. A large amount of amorphous food debris was noted in the distal esophagus, residual from his previous procedure. Initially, the Bio-Vac device was used to suction up multiple small fragments, then approximately 4 passages were made with the [facility] retrieval basket, obtaining small to moderate amounts of food with each passage. Finally, the Bio-Vac device was used again to get the small remaining amounts of food in the distal esophagus.  The patient had a very prominent esophageal ring at the squamocolumnar junction, through which the  therapeutic scope ( ? 13 mm diameter) passed with slight resistance. Below this was a 2-3 cm hiatal hernia.  There is a small amount of bile in the stomach, but the stomach was otherwise normal, including a retroflex view of the cardia. The pylorus, duodenal bulb, and second duodenum looked normal.  There was mild stasis esophagitis in the distal esophagus, but no deep ulcerations or severe inflammation, so it was felt safe and prudent to do a conservative esophageal dilatation. This was done by using a 15 mm TTS balloon, inflated for 30 seconds. Although there was no obvious mucosal disruption or fracture of the ring from doing so, it was noted that the endoscope passed without resistance through this area following the dilatation, whereas there had been resistance earlier. Therefore, it is felt that some degree of dilatation was accomplished.  the patient tolerated the procedure well.     COMPLICATIONS: None  ENDOSCOPIC IMPRESSION:  1. Successful endoscopic relief of food impaction as described above 2. Moderately tight esophageal ring, dilated as described above by balloon technique to 15 mm 3. Small hiatal hernia  RECOMMENDATIONS:  1. Okay to resume anticoagulation  2. Would maintain the patient on PPI therapy, as he was prior to admission  3. The patient's dentures apparently do not fit to well. He should probably be maintained on a soft diet indefinitely, and counseled in the importance of avoiding large boluses of meat or other firm foods.    _______________________________ eSignedBernette Redbird:  Josphine Laffey, MD 04/26/2013 8:57 PM    PATIENT NAME:  Jordan [name], Jordan B. MR#: [id number]

## 2013-04-26 NOTE — Anesthesia Postprocedure Evaluation (Signed)
  Anesthesia Post-op Note  Patient: Jordan [name]  Procedure(s) Performed: Procedure(s): ESOPHAGOGASTRODUODENOSCOPY (EGD) WITH ESOPHAGEAL DILATION (N/A)  Patient Location: PACU  Anesthesia Type:General  Level of Consciousness: awake, alert , oriented and patient cooperative  Airway and Oxygen Therapy: Patient Spontanous Breathing and Patient connected to nasal cannula oxygen  Post-op Pain: none  Post-op Assessment: Post-op Vital signs reviewed, Patient's Cardiovascular Status Stable, Respiratory Function Stable, Patent Airway, No signs of Nausea or vomiting and Pain level controlled  Post-op Vital Signs: Reviewed and stable  Complications: No apparent anesthesia complications

## 2013-04-26 NOTE — Consult Note (Signed)
[facility] CONSULT Reason for consult: inability to swallow liquid or solid Referring Physician: Dr. Margaretmary [name] [facility]. PCP: Dr. Arelia [name]. 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  HPI: He has seen Dr. Michail [name] and Dr. Watt [name] in the past 4 problems with esophageal stricture. 9/08 patient was dilated to 15 mm by Dr. Watt [name] with a balloon dilatation. He made another dilatation's is outpatient. He reports the following prior dilatation to was able to swallow without problems. He has a history of atrial fibrillation and is on Coumadin, COPD. Thursday 2/12 he ate hamburger and mashed potatoes at the home where he was living and subsequent to that meal at a mechanical fall resulting in fracture of his right hip. Since he was on Coumadin his anticoagulation was reversed with IV vitamin K and 2/13 he went to surgery and had repair of his right hip by Dr. Mayer [name]. The patient was on IV heparin following the surgery and this was just stop today and Coumadin reorder. His last PT was yesterday and was 25. The patient's last real meal was Thursday and was hamburger and mashed potatoes. He has been taking clear liquids and pills yesterday and apparently had regurgitation of some of the liquids. Today he ate breakfast and the nurse reports a small amount of breakfast all of which was spit back up. The patient and staff report that he has regurgitated breakfast has been drinking clear liquids and water this morning all of which is been regurgitated back up. He states that it is uncomfortable in his subxiphoid area even after liquids. We're asked to see him for possible esophageal obstruction and food impaction. Past Medical History  Diagnosis Date  . GERD (gastroesophageal reflux disease)   . Hypertension   . COPD (chronic obstructive pulmonary disease)   . Chronic kidney disease   . Ventricular bigeminy   . PVC (premature ventricular contraction)   . Dyspnea     Past  Surgical History  Procedure Laterality Date  . Cholecystectomy    . Splenectomy, partial      Family History  Problem Relation Age of Onset  . Hypertension Father     Social History:  reports that he has never smoked. He does not have any smokeless tobacco history on file. He reports that he does not drink alcohol or use illicit drugs.  Allergies:  Allergies  Allergen Reactions  . Diltiazem Hcl Er Other (See Comments)    Tachycardia  . Metoprolol Succinate Er [Metoprolol Tartrate] Other (See Comments)    hallucinations    Medications; Prior to Admission medications   Medication Sig Start Date End Date Taking? Authorizing Provider  Acetaminophen (TYLENOL EXTRA STRENGTH PO) Take 2 tablets by mouth daily as needed (for pain).    Yes Historical Provider, MD  albuterol (PROVENTIL HFA;VENTOLIN HFA) 108 (90 BASE) MCG/ACT inhaler Inhale 2 puffs into the lungs every 6 (six) hours as needed for wheezing.   Yes Historical Provider, MD  atenolol (TENORMIN) 25 MG tablet Take 1 tablet (25 mg total) by mouth daily. 12/20/12  Yes Jordan Doffing, MD  flecainide (TAMBOCOR) 100 MG tablet Take 50 mg by mouth 2 (two) times daily.   Yes Historical Provider, MD  furosemide (LASIX) 20 MG tablet Take 40 mg by mouth daily as needed for fluid.  12/20/12  Yes Jordan Doffing, MD  latanoprost (XALATAN) 0.005 % ophthalmic solution Place 1 drop into both eyes at bedtime.   Yes Historical Provider, MD  levothyroxine (SYNTHROID, LEVOTHROID)  100 MCG tablet Take 50 mcg by mouth daily before breakfast.    Yes Historical Provider, MD  omeprazole (PRILOSEC) 20 MG capsule Take 20 mg by mouth daily.   Yes Historical Provider, MD  HYDROcodone-acetaminophen (NORCO) 5-325 MG per tablet Take 1 tablet by mouth every 6 (six) hours as needed. 04/26/13   Jordan Parody, PA-C  tizanidine (ZANAFLEX) 2 MG capsule Take 1 capsule (2 mg total) by mouth 3 (three) times daily. 04/26/13   Jordan Parody, PA-C  warfarin (COUMADIN) 2.5 MG  tablet Take 1 tablet on all days except 1.5 tablets on Monday, Wednesday, and Friday; or as directed by [facility]. 04/26/13   Jordan Parody, PA-C   . atenolol  25 mg Oral Daily  . budesonide (PULMICORT) nebulizer solution  0.25 mg Nebulization BID  . flecainide  50 mg Oral BID  . latanoprost  1 drop Both Eyes QHS  . levofloxacin  500 mg Oral Q48H  . levothyroxine  50 mcg Oral QAC breakfast  . pantoprazole  40 mg Oral Daily  . warfarin  2.5 mg Oral ONCE-1800  . Warfarin - Pharmacist Dosing Inpatient   Does not apply q1800   PRN Meds acetaminophen, acetaminophen, albuterol, furosemide, HYDROcodone-acetaminophen, menthol-cetylpyridinium, metoCLOPramide (REGLAN) injection, metoCLOPramide, morphine injection, ondansetron (ZOFRAN) IV, ondansetron, phenol Results for orders placed during the hospital encounter of 04/24/13 (from the past 48 hour(s))  CBC     Status: Abnormal   Collection Time    04/24/13  2:37 PM      Result Value Ref Range   WBC 15.7 (*) 4.0 - 10.5 K/uL   RBC 4.09 (*) 4.22 - 5.81 MIL/uL   Hemoglobin 14.1  13.0 - 17.0 g/dL   HCT 40.4  39.0 - 52.0 %   MCV 98.8  78.0 - 100.0 fL   MCH 34.5 (*) 26.0 - 34.0 pg   MCHC 34.9  30.0 - 36.0 g/dL   RDW 13.0  11.5 - 15.5 %   Platelets 228  150 - 400 K/uL  BASIC METABOLIC PANEL     Status: Abnormal   Collection Time    04/24/13  2:37 PM      Result Value Ref Range   Sodium 141  137 - 147 mEq/[name]   Potassium 5.3  3.7 - 5.3 mEq/[name]   Comment: HEMOLYSIS AT THIS LEVEL MAY AFFECT RESULT   Chloride 105  96 - 112 mEq/[name]   CO2 22  19 - 32 mEq/[name]   Glucose, Bld 101 (*) 70 - 99 mg/dL   BUN 35 (*) 6 - 23 mg/dL   Creatinine, Ser 1.75 (*) 0.50 - 1.35 mg/dL   Calcium 8.7  8.4 - 10.5 mg/dL   GFR calc non Af Amer 32 (*) >90 mL/min   GFR calc Af Amer 37 (*) >90 mL/min   Comment: (NOTE)     The eGFR has been calculated using the CKD EPI equation.     This calculation has not been validated in all clinical situations.     eGFR's persistently  <90 mL/min signify possible Chronic Kidney     Disease.  PROTIME-INR     Status: Abnormal   Collection Time    04/24/13  2:37 PM      Result Value Ref Range   Prothrombin Time 23.1 (*) 11.6 - 15.2 seconds   INR 2.12 (*) 0.00 - 1.49  TYPE AND SCREEN     Status: None   Collection Time    04/24/13  3:04 PM  Result Value Ref Range   ABO/RH(D) B NEG     Antibody Screen NEG     Sample Expiration 04/27/2013    ABO/RH     Status: None   Collection Time    04/24/13  3:04 PM      Result Value Ref Range   ABO/RH(D) B NEG    URINALYSIS, ROUTINE W REFLEX MICROSCOPIC     Status: Abnormal   Collection Time    04/24/13  5:00 PM      Result Value Ref Range   Color, Urine BROWN (*) YELLOW   Comment: BIOCHEMICALS MAY BE AFFECTED BY COLOR   APPearance CLOUDY (*) CLEAR   Specific Gravity, Urine 1.026  1.005 - 1.030   pH 5.0  5.0 - 8.0   Glucose, UA NEGATIVE  NEGATIVE mg/dL   Hgb urine dipstick LARGE (*) NEGATIVE   Bilirubin Urine NEGATIVE  NEGATIVE   Ketones, ur NEGATIVE  NEGATIVE mg/dL   Protein, ur NEGATIVE  NEGATIVE mg/dL   Urobilinogen, UA 0.2  0.0 - 1.0 mg/dL   Nitrite NEGATIVE  NEGATIVE   Leukocytes, UA NEGATIVE  NEGATIVE  URINE MICROSCOPIC-ADD ON     Status: Abnormal   Collection Time    04/24/13  5:00 PM      Result Value Ref Range   Squamous Epithelial / LPF RARE  RARE   WBC, UA 0-2  <3 WBC/hpf   RBC / HPF TOO NUMEROUS TO COUNT  <3 RBC/hpf   Casts HYALINE CASTS (*) NEGATIVE  LEGIONELLA ANTIGEN, URINE     Status: None   Collection Time    04/24/13  9:06 PM      Result Value Ref Range   Specimen Description URINE, CATHETERIZED     Special Requests NONE     Legionella Antigen, Urine       Value: Negative for Legionella pneumophilia serogroup 1     Performed at [facility]   Report Status 04/25/2013 FINAL    URINE CULTURE     Status: None   Collection Time    04/24/13  9:06 PM      Result Value Ref Range   Specimen Description URINE, CATHETERIZED     Special  Requests NONE     Culture  Setup Time       Value: 04/24/2013 21:38     Performed at [facility]       Value: NO GROWTH     Performed at [facility]   Culture       Value: NO GROWTH     Performed at [facility]   Report Status 04/25/2013 FINAL    STREP PNEUMONIAE URINARY ANTIGEN     Status: None   Collection Time    04/24/13  9:07 PM      Result Value Ref Range   Strep Pneumo Urinary Antigen NEGATIVE  NEGATIVE   Comment:            Infection due to S. pneumoniae     cannot be absolutely ruled out     since the antigen present     may be below the detection limit     of the test.  CULTURE, EXPECTORATED SPUTUM-ASSESSMENT     Status: None   Collection Time    04/25/13 12:30 AM      Result Value Ref Range   Specimen Description SPUTUM     Special Requests NONE     Sputum evaluation  Value: MICROSCOPIC FINDINGS SUGGEST THAT THIS SPECIMEN IS NOT REPRESENTATIVE OF LOWER RESPIRATORY SECRETIONS. PLEASE RECOLLECT.     Results Called to: Mellody Memos, [place] [id number] at [facility]   Report Status 04/25/2013 FINAL    SURGICAL PCR SCREEN     Status: None   Collection Time    04/25/13  6:02 AM      Result Value Ref Range   MRSA, PCR NEGATIVE  NEGATIVE   Staphylococcus aureus NEGATIVE  NEGATIVE   Comment:            The Xpert SA Assay (FDA     approved for NASAL specimens     in patients over 45 years of age),     is one component of     a comprehensive surveillance     program.  Test performance has     been validated by [employer] for patients greater     than or equal to 48 year old.     It is not intended     to diagnose infection nor to     guide or monitor treatment.  CBC     Status: Abnormal   Collection Time    04/25/13  7:00 AM      Result Value Ref Range   WBC 16.1 (*) 4.0 - 10.5 K/uL   RBC 4.02 (*) 4.22 - 5.81 MIL/uL   Hemoglobin 13.4  13.0 - 17.0 g/dL   HCT 39.8  39.0 - 52.0 %   MCV 99.0  78.0 - 100.0 fL   MCH  33.3  26.0 - 34.0 pg   MCHC 33.7  30.0 - 36.0 g/dL   RDW 13.1  11.5 - 15.5 %   Platelets 248  150 - 400 K/uL  PROTIME-INR     Status: Abnormal   Collection Time    04/25/13  7:00 AM      Result Value Ref Range   Prothrombin Time 24.3 (*) 11.6 - 15.2 seconds   INR 2.27 (*) 0.00 - 1.49  HEPARIN LEVEL (UNFRACTIONATED)     Status: None   Collection Time    04/25/13  7:00 AM      Result Value Ref Range   Heparin Unfractionated 0.47  0.30 - 0.70 IU/mL   Comment:            IF HEPARIN RESULTS ARE BELOW     EXPECTED VALUES, AND PATIENT     DOSAGE HAS BEEN CONFIRMED,     SUGGEST FOLLOW UP TESTING     OF ANTITHROMBIN III LEVELS.  PROTIME-INR     Status: Abnormal   Collection Time    04/25/13 11:05 AM      Result Value Ref Range   Prothrombin Time 25.1 (*) 11.6 - 15.2 seconds   INR 2.37 (*) 0.00 - 1.49  HEPARIN LEVEL (UNFRACTIONATED)     Status: Abnormal   Collection Time    04/26/13  5:11 AM      Result Value Ref Range   Heparin Unfractionated <0.10 (*) 0.30 - 0.70 IU/mL   Comment:            IF HEPARIN RESULTS ARE BELOW     EXPECTED VALUES, AND PATIENT     DOSAGE HAS BEEN CONFIRMED,     SUGGEST FOLLOW UP TESTING     OF ANTITHROMBIN III LEVELS.  CBC     Status: Abnormal   Collection Time    04/26/13  5:11 AM  Result Value Ref Range   WBC 13.5 (*) 4.0 - 10.5 K/uL   RBC 3.48 (*) 4.22 - 5.81 MIL/uL   Hemoglobin 11.5 (*) 13.0 - 17.0 g/dL   HCT 35.1 (*) 39.0 - 52.0 %   MCV 100.9 (*) 78.0 - 100.0 fL   MCH 33.0  26.0 - 34.0 pg   MCHC 32.8  30.0 - 36.0 g/dL   RDW 13.4  11.5 - 15.5 %   Platelets 201  150 - 400 K/uL  BASIC METABOLIC PANEL     Status: Abnormal   Collection Time    04/26/13  5:11 AM      Result Value Ref Range   Sodium 139  137 - 147 mEq/[name]   Potassium 5.2  3.7 - 5.3 mEq/[name]   Chloride 106  96 - 112 mEq/[name]   CO2 23  19 - 32 mEq/[name]   Glucose, Bld 119 (*) 70 - 99 mg/dL   BUN 34 (*) 6 - 23 mg/dL   Creatinine, Ser 1.94 (*) 0.50 - 1.35 mg/dL   Calcium 8.1 (*) 8.4 -  10.5 mg/dL   GFR calc non Af Amer 28 (*) >90 mL/min   GFR calc Af Amer 33 (*) >90 mL/min   Comment: (NOTE)     The eGFR has been calculated using the CKD EPI equation.     This calculation has not been validated in all clinical situations.     eGFR's persistently <90 mL/min signify possible Chronic Kidney     Disease.    Dg Chest 1 View  04/24/2013   CLINICAL DATA:  [place] this morning. Right hip fracture. Preoperative respiratory evaluation. Shortness of breath.  EXAM: CHEST - 1 VIEW  COMPARISON:  DG CHEST 1V PORT dated 12/16/2012; CT CHEST W/O CM dated 06/13/2007; CT CHEST W/CM dated 12/01/2006; DG CHEST 2 VIEW dated 12/08/2004  FINDINGS: Focal opacity at the right lung base, increased since the prior examinations. The prior CT showed new interstitial fibrosis in the right lower lobe. Mild hyperinflation and mild emphysematous changes in the upper lobes, unchanged. Lungs otherwise clear. No pleural effusions. No pneumothorax. Normal pulmonary vascularity. Cardiac silhouette mildly enlarged but stable.  IMPRESSION: 1. Increased opacity at the right lung base compared to prior examinations, with baseline interstitial fibrosis in the right lower lobe. This could represent worsening fibrosis or a superimposed bronchopneumonia. Are there clinical findings to suggest pneumonia? 2. Stable COPD/emphysema. No acute cardiopulmonary disease otherwise. 3. Stable mild cardiomegaly without pulmonary edema.   Electronically Signed   By: Evangeline Dakin M.D.   On: 04/24/2013 16:22   Dg Hip Complete Right  04/24/2013   CLINICAL DATA:  Fall.  Right hip pain.  EXAM: RIGHT HIP - COMPLETE 2+ VIEW  COMPARISON:  None.  FINDINGS: The patient has a subcapital right hip fracture. No other acute bony or joint abnormality is identified. Bones are osteopenic.  IMPRESSION: Acute subcapital right hip fracture.   Electronically Signed   By: Inge Rise M.D.   On: 04/24/2013 16:18   Dg Pelvis Portable  04/25/2013   CLINICAL DATA:   Postop  EXAM: PORTABLE PELVIS 1-2 VIEWS  COMPARISON:  Preoperative radiographs of the pelvis and right hip 04/24/2013  FINDINGS: Interval surgical changes of right hip arthroplasty. Expected postsurgical change including new Jana Half row cyst and subcutaneous emphysema. The inferior extent of the femoral stem prosthesis is not entirely imaged. There is no periprosthetic fracture acute hardware complication along the imaged portion. The bony pelvis appears intact. Mild osteopenia.  Unremarkable visualized bowel gas pattern.  IMPRESSION: Postoperative changes of right hip arthroplasty without evidence of immediate hardware complication.   Electronically Signed   By: Jacqulynn Cadet M.D.   On: 04/25/2013 18:47             Blood pressure 125/53, pulse 70, temperature 98.3 F (36.8 C), temperature source Oral, resp. rate 18, height _0  (1.727 m), weight 88.179 kg (194 lb 6.4 oz), SpO2 99.00%.  Physical exam:   General-- elderly white male who was alert and oriented and gives a very good history. He appears younger and his stated age of 15 Heart-- regular rate and rhythm Lungs--clear Abdomen-- nondistended and nontender other than very mild tenderness at the subxiphoid area  I had the patient drank approximately 8 ounces of water in the sitting position. He Discount for approximately 5 to 8 minutes and then regurgitated the majority of it back up.   Assessment: 1. Possible esophageal obstruction. The patient's last significant meal prior to regurgitation of liquids and solids was Thursday at which time he ate hamburger and mashed potatoes. This was followed by hip fracture surgery with clear liquids only begun last night. It appears that he's been regurgitating up liquids ever since they were started back after surgery. He apparently ate minimal solid food this morning and regurgitated it all back up. Whatever is obstructing his esophagus has probably been there since Thursday. Unfortunately, he  is anticoagulated and I think this will need to be corrected prior to any type of therapeutic intervention. 2. History of esophageal stricture. This is been dilated in the past with the last records showing on dilation to 15 mm 9/08. 3. Atrial fib on chronic anticoagulation 4. COPD 5. Recent right hip fracture status post repair yesterday  Plan: 1. I have discussed this with the patient and his daughter. We will keep them NPO for now. We will obtain stat PT. Since he has likely had solid food material in his esophagus for the past 2 days, it could be very difficult to get this out and I think the procedure should be delayed until his clotting parameters are near-normal. If his PT is elevated, we will likely give FFP tonight and plan on preceding with EGD and disimpaction tomorrow. If his pro-time is within the normal range we make and proceed with the procedure today.   Jordan [name],Jordan [name] 04/26/2013, 2:21 PM

## 2013-04-26 NOTE — Anesthesia Procedure Notes (Signed)
Procedure Name: Intubation Date/Time: 04/26/2013 8:04 PM Performed by: Wray KearnsFOLEY, Syncere Eble A Pre-anesthesia Checklist: Patient identified, Patient being monitored, Emergency Drugs available, Timeout performed and Suction available Patient Re-evaluated:Patient Re-evaluated prior to inductionOxygen Delivery Method: Circle system utilized Preoxygenation: Pre-oxygenation with 100% oxygen Intubation Type: IV induction, Rapid sequence and Cricoid Pressure applied Laryngoscope Size: Mac and 4 Grade View: Grade I Tube type: Oral Number of attempts: 1 Airway Equipment and Method: Stylet Placement Confirmation: ETT inserted through vocal cords under direct vision,  positive ETCO2 and breath sounds checked- equal and bilateral Secured at: 22 cm Tube secured with: Tape Dental Injury: Teeth and Oropharynx as per pre-operative assessment

## 2013-04-26 NOTE — Progress Notes (Signed)
[facility] PROGRESS NOTE  KHASIR WOODROME ZOX:[id number] DOB: February 02, 1919 DOA: 04/24/2013 PCP: Kaleen Mask, MD  Assessment/Plan: Right Hip Fracture -For repair today. -Will likely need SNF afterwards.  Food Impaction -Has a h/o esophageal stricture. -Seen by GI with plans for EGD for disimpaction.   Atrial Fibrillation -Coumadin was reversed for surgery. -Restart coumadin once plans for EGD are clear.  HTN -Continue home medications. -Fair control.  ?CAP -Afebrile/no leukocytosis. -Doubt infiltrates on CXR translate to clinical PNA. -Continue PO levaquin empirically for 7 days.  Chronic Diastolic CHF -Compensated at present.  Code Status: Full code Family Communication: Daughter at bedside updated on plan of care.  Disposition Plan: SNF.   Consultants:  Ortho   Antibiotics:  Levaquin  Subjective: C/o food being stuck when he swallows.  Objective: Filed Vitals:   04/26/13 0901 04/26/13 1053 04/26/13 1200 04/26/13 1312  BP: 130/55 133/53  125/53  Pulse: 74 72  70  Temp: 98.6 F (37 C)   98.3 F (36.8 C)  TempSrc: Oral   Oral  Resp: 17  18 18   Height:      Weight:      SpO2: 99%  98% 99%    Intake/Output Summary (Last 24 hours) at 04/26/13 1504 Last data filed at 04/26/13 1256  Gross per 24 hour  Intake   1835 ml  Output   1135 ml  Net    700 ml   Filed Weights   04/24/13 1900 04/25/13 0500 04/26/13 0532  Weight: 84.823 kg (187 lb) 84.777 kg (186 lb 14.4 oz) 88.179 kg (194 lb 6.4 oz)    Exam:   General:  AA Ox3  Cardiovascular: RRR  Respiratory: CTA B  Abdomen: S/NT/ND/+BS  Extremities: right is externally rotated.   Neurologic:  Non-focal.  Data Reviewed: Basic Metabolic Panel:  Recent Labs Lab 04/24/13 1437 04/26/13 0511  NA 141 139  K 5.3 5.2  CL 105 106  CO2 22 23  GLUCOSE 101* 119*  BUN 35* 34*  CREATININE 1.75* 1.94*  CALCIUM 8.7 8.1*   Liver Function Tests: No results found for this basename:  AST, ALT, ALKPHOS, BILITOT, PROT, ALBUMIN,  in the last 168 hours No results found for this basename: LIPASE, AMYLASE,  in the last 168 hours No results found for this basename: AMMONIA,  in the last 168 hours CBC:  Recent Labs Lab 04/24/13 1437 04/25/13 0700 04/26/13 0511  WBC 15.7* 16.1* 13.5*  HGB 14.1 13.4 11.5*  HCT 40.4 39.8 35.1*  MCV 98.8 99.0 100.9*  PLT 228 248 201   Cardiac Enzymes: No results found for this basename: CKTOTAL, CKMB, CKMBINDEX, TROPONINI,  in the last 168 hours BNP (last 3 results)  Recent Labs  12/19/12 1615  PROBNP 669.1*   CBG: No results found for this basename: GLUCAP,  in the last 168 hours  Recent Results (from the past 240 hour(s))  URINE CULTURE     Status: None   Collection Time    04/24/13  9:06 PM      Result Value Ref Range Status   Specimen Description URINE, CATHETERIZED   Final   Special Requests NONE   Final   Culture  Setup Time     Final   Value: 04/24/2013 21:38     Performed at [facility] Count     Final   Value: NO GROWTH     Performed at [facility]   Culture     Final  Value: NO GROWTH     [facility]   Report Status 04/25/2013 FINAL   Final  CULTURE, EXPECTORATED SPUTUM-ASSESSMENT     Status: None   Collection Time    04/25/13 12:30 AM      Result Value Ref Range Status   Specimen Description SPUTUM   Final   Special Requests NONE   Final   Sputum evaluation     Final   Value: MICROSCOPIC FINDINGS SUGGEST THAT THIS SPECIMEN IS NOT REPRESENTATIVE OF LOWER RESPIRATORY SECRETIONS. PLEASE RECOLLECT.     Results Called to: Josefa HalfM Johnson, RN [phone] at 0229  Dia SitterK Wilder   Report Status 04/25/2013 FINAL   Final  SURGICAL PCR SCREEN     Status: None   Collection Time    04/25/13  6:02 AM      Result Value Ref Range Status   MRSA, PCR NEGATIVE  NEGATIVE Final   Staphylococcus aureus NEGATIVE  NEGATIVE Final   Comment:            The Xpert SA Assay (FDA     approved for  NASAL specimens     in patients over 78 years of age),     is one component of     a comprehensive surveillance     program.  Test performance has     been validated by [facility] for patients greater     than or equal to 78 year old.     It is not intended     to diagnose infection nor to     guide or monitor treatment.     Studies: Dg Chest 1 View  04/24/2013   CLINICAL DATA:  Larey SeatFell this morning. Right hip fracture. Preoperative respiratory evaluation. Shortness of breath.  EXAM: CHEST - 1 VIEW  COMPARISON:  DG CHEST 1V PORT dated 12/16/2012; CT CHEST W/O CM dated 06/13/2007; CT CHEST W/CM dated 12/01/2006; DG CHEST 2 VIEW dated 12/08/2004  FINDINGS: Focal opacity at the right lung base, increased since the prior examinations. The prior CT showed new interstitial fibrosis in the right lower lobe. Mild hyperinflation and mild emphysematous changes in the upper lobes, unchanged. Lungs otherwise clear. No pleural effusions. No pneumothorax. Normal pulmonary vascularity. Cardiac silhouette mildly enlarged but stable.  IMPRESSION: 1. Increased opacity at the right lung base compared to prior examinations, with baseline interstitial fibrosis in the right lower lobe. This could represent worsening fibrosis or a superimposed bronchopneumonia. Are there clinical findings to suggest pneumonia? 2. Stable COPD/emphysema. No acute cardiopulmonary disease otherwise. 3. Stable mild cardiomegaly without pulmonary edema.   Electronically Signed   By: Hulan Saashomas  Lawrence M.D.   On: 04/24/2013 16:22   Dg Hip Complete Right  04/24/2013   CLINICAL DATA:  Fall.  Right hip pain.  EXAM: RIGHT HIP - COMPLETE 2+ VIEW  COMPARISON:  None.  FINDINGS: The patient has a subcapital right hip fracture. No other acute bony or joint abnormality is identified. Bones are osteopenic.  IMPRESSION: Acute subcapital right hip fracture.   Electronically Signed   By: Drusilla Kannerhomas  Dalessio M.D.   On: 04/24/2013 16:18   Dg Pelvis  Portable  04/25/2013   CLINICAL DATA:  Postop  EXAM: PORTABLE PELVIS 1-2 VIEWS  COMPARISON:  Preoperative radiographs of the pelvis and right hip 04/24/2013  FINDINGS: Interval surgical changes of right hip arthroplasty. Expected postsurgical change including new Johnny BridgeMartha row cyst and subcutaneous emphysema. The inferior extent of the femoral stem prosthesis  is not entirely imaged. There is no periprosthetic fracture acute hardware complication along the imaged portion. The bony pelvis appears intact. Mild osteopenia. Unremarkable visualized bowel gas pattern.  IMPRESSION: Postoperative changes of right hip arthroplasty without evidence of immediate hardware complication.   Electronically Signed   By: Malachy Moan M.D.   On: 04/25/2013 18:47    Scheduled Meds: . atenolol  25 mg Oral Daily  . budesonide (PULMICORT) nebulizer solution  0.25 mg Nebulization BID  . flecainide  50 mg Oral BID  . latanoprost  1 drop Both Eyes QHS  . levofloxacin  500 mg Oral Q48H  . levothyroxine  50 mcg Oral QAC breakfast  . pantoprazole (PROTONIX) IV  40 mg Intravenous Q12H  . warfarin  2.5 mg Oral ONCE-1800  . Warfarin - Pharmacist Dosing Inpatient   Does not apply q1800   Continuous Infusions: . dextrose 5 % and 0.45 % NaCl with KCl 20 mEq/L 100 mL/hr at 04/26/13 1610    Principal Problem:   Closed right hip fracture Active Problems:   Hypertension   Hypothyroidism   Atrial fibrillation   COPD (chronic obstructive pulmonary disease)   Chronic anticoagulation   Chronic diastolic heart failure   Chronic kidney disease (CKD) stage G3a/A1, moderately decreased glomerular filtration rate (GFR) between 45-59 mL/min/1.73 square meter and albuminuria creatinine ratio less than 30 mg/g   CAP (community acquired pneumonia)   Leukocytosis   Food impaction of esophagus    Time spent: 35 minutes. Greater than 50% of this time was spent in direct contact with the patient coordinating care.    Chaya Jan  [facility] Pager [phone]  If 7PM-7AM, please contact night-coverage at [email or web address], password [facility] 04/26/2013, 3:04 PM  LOS: 2 days

## 2013-04-27 LAB — CBC
HCT: 30.7 % — ABNORMAL LOW (ref 39.0–52.0)
Hemoglobin: 10.3 g/dL — ABNORMAL LOW (ref 13.0–17.0)
MCH: 33.7 pg (ref 26.0–34.0)
MCHC: 33.6 g/dL (ref 30.0–36.0)
MCV: 100.3 fL — ABNORMAL HIGH (ref 78.0–100.0)
Platelets: 182 K/uL (ref 150–400)
RBC: 3.06 MIL/uL — ABNORMAL LOW (ref 4.22–5.81)
RDW: 13.4 % (ref 11.5–15.5)
WBC: 15.7 K/uL — ABNORMAL HIGH (ref 4.0–10.5)

## 2013-04-27 LAB — BASIC METABOLIC PANEL WITH GFR
BUN: 36 mg/dL — ABNORMAL HIGH (ref 6–23)
CO2: 21 meq/L (ref 19–32)
Calcium: 7.9 mg/dL — ABNORMAL LOW (ref 8.4–10.5)
Chloride: 105 meq/L (ref 96–112)
Creatinine, Ser: 2.15 mg/dL — ABNORMAL HIGH (ref 0.50–1.35)
GFR calc Af Amer: 29 mL/min — ABNORMAL LOW
GFR calc non Af Amer: 25 mL/min — ABNORMAL LOW
Glucose, Bld: 113 mg/dL — ABNORMAL HIGH (ref 70–99)
Potassium: 5.3 meq/L (ref 3.7–5.3)
Sodium: 137 meq/L (ref 137–147)

## 2013-04-27 LAB — PROTIME-INR
INR: 1.22 (ref 0.00–1.49)
Prothrombin Time: 15.1 s (ref 11.6–15.2)

## 2013-04-27 MED ORDER — WARFARIN SODIUM 3 MG PO TABS
3.0000 mg | ORAL_TABLET | Freq: Once | ORAL | Status: AC
  Administered 2013-04-27: 3 mg via ORAL
  Filled 2013-04-27: qty 1

## 2013-04-27 MED ORDER — MIDAZOLAM HCL 2 MG/2ML IJ SOLN: 1.0000 mg | INTRAMUSCULAR | Status: DC | PRN

## 2013-04-27 MED ORDER — FENTANYL CITRATE 0.05 MG/ML IJ SOLN: 50.0000 ug | Freq: Once | INTRAMUSCULAR | Status: DC

## 2013-04-27 MED ORDER — POLYETHYLENE GLYCOL 3350 17 G PO PACK
17.0000 g | PACK | Freq: Every day | ORAL | Status: DC
  Administered 2013-04-27 – 2013-04-28 (×2): 17 g via ORAL
  Filled 2013-04-27 (×2): qty 1

## 2013-04-27 MED ORDER — BISACODYL 10 MG RE SUPP
10.0000 mg | Freq: Once | RECTAL | Status: AC
  Administered 2013-04-27: 10 mg via RECTAL
  Filled 2013-04-27: qty 1

## 2013-04-27 MED ORDER — SODIUM CHLORIDE 0.9 % IV SOLN: INTRAVENOUS | Status: DC

## 2013-04-27 NOTE — Progress Notes (Signed)
PATIENT ID: Jordan [name]  MRN: [id number]  DOB/AGE:  78/28/1920 / 80 y.o.  1 Day Post-Op Procedure(s) (LRB): ESOPHAGOGASTRODUODENOSCOPY (EGD) WITH ESOPHAGEAL DILATION (N/A)  2 days Post-OPARTHROPLASTY BIPOLAR HIP (Right)    PROGRESS NOTE Subjective: Patient is alert, oriented,no Nausea, no Vomiting, yes passing gas, no Bowel Movement. Taking PO sips now with advancing diet. Denies SOB, Chest or Calf Pain. Using Incentive Spirometer, PAS in place. Ambulate WBAt Patient reports pain as mild  .    Objective: Vital signs in last 24 hours: Filed Vitals:   04/27/13 0100 04/27/13 0510 04/27/13 0625 04/27/13 0807  BP:  113/48    Pulse:  86    Temp:  97.7 F (36.5 C)    TempSrc:  Oral    Resp:    18  Height:      Weight:   95.21 kg (209 lb 14.4 oz)   SpO2: 96% 93%  98%      Intake/Output from previous day: I/O last 3 completed shifts: In: 4151.3 [P.O.:935; I.V.:3216.3] Out: 675 [Urine:650; Emesis/NG output:25]   Intake/Output this shift:     LABORATORY DATA:  Recent Labs  04/26/13 0511 04/26/13 1410 04/27/13 0540  WBC 13.5*  --  15.7*  HGB 11.5*  --  10.3*  HCT 35.1*  --  30.7*  PLT 201  --  182  NA 139  --  137  K 5.2  --  5.3  CL 106  --  105  CO2 23  --  21  BUN 34*  --  36*  CREATININE 1.94*  --  2.15*  GLUCOSE 119*  --  113*  INR  --  1.24 1.22  CALCIUM 8.1*  --  7.9*    Examination: Neurologically intact Neurovascular intact Sensation intact distally Intact pulses distally Dorsiflexion/Plantar flexion intact Incision: scant drainage No cellulitis present Compartment soft} XR AP&Lat of hip shows well placed\fixed THA  Assessment:   1 Day Post-Op Procedure(s) (LRB): ESOPHAGOGASTRODUODENOSCOPY (EGD) WITH ESOPHAGEAL DILATION (N/A)  2 days Post-OPARTHROPLASTY BIPOLAR HIP (Right)  ADDITIONAL DIAGNOSIS:  Hypertension, Cardiac Arrythmia AFIB and chronic diastolic CHF.  Plan: PT/OT WBAT, THA  posterior precautions  DVT Prophylaxis: SCDx72 hrs,  Restart pre-op coumadin  DISCHARGE PLAN: Skilled Nursing Facility/Rehab when pt passes PT goals and is cleared by medicine.   DISCHARGE NEEDS: HHPT, HHRN, Walker and 3-in-1 comode seat

## 2013-04-27 NOTE — Evaluation (Signed)
Occupational Therapy Evaluation Patient Details Name: Jordan [name] MRN: [id number] DOB: 21-Sep-1918 Today's Date: 04/27/2013 Time: 8768-1157 OT Time Calculation (min): 25 min  OT Assessment / Plan / Recommendation History of present illness s/p R THA   Clinical Impression   Pt presents with below problem list. Education provided during session and pt practiced with AE for LB ADLs. Pt planning to d/c to SNF. All further OT needs can be met in next venue of care.     OT Assessment  All further OT needs can be met in the next venue of care    Follow Up Recommendations  SNF    Barriers to Discharge Decreased caregiver support    Equipment Recommendations  Other (comment) (defer to next venue)    Recommendations for Other Services    Frequency       Precautions / Restrictions Precautions Precautions: Posterior Hip;Fall Precaution Booklet Issued:  (had one in room) Precaution Comments: Reviewed precautions with pt and family present Restrictions Weight Bearing Restrictions: Yes RLE Weight Bearing: Weight bearing as tolerated   Pertinent Vitals/Pain Pain in hip with ambulation, but not rated. Repositioned in bed with pillow between legs. O2 in 80's at times during session. Educated on deep breathing technique. Nurse notified.     ADL  Upper Body Dressing: Set up;Supervision/safety Where Assessed - Upper Body Dressing: Supported sitting Lower Body Dressing: Maximal assistance Where Assessed - Lower Body Dressing: Supported sit to Lobbyist: Moderate assistance;Maximal assistance;+2 Total assistance Toilet Transfer Method: Sit to stand;Stand pivot Toilet Transfer Equipment: Other (comment) (from chair to bed) Tub/Shower Transfer Method: Not assessed Equipment Used: Gait belt;Rolling walker;Long-handled shoe horn;Long-handled sponge;Reacher;Sock aid Transfers/Ambulation Related to ADLs: Mod A/Max A for sit to stand transfer. +2 total A/Mod A for stand pivot  transfer. ADL Comments: Educated on AE for LB ADLs as well as energy conservation techniques.  Educated on dressing technique. Recommended sitting for bathing and dressing (as much as he can).    OT Diagnosis: Acute pain;Generalized weakness  OT Problem List: Decreased strength;Impaired balance (sitting and/or standing);Decreased activity tolerance;Decreased knowledge of use of DME or AE;Decreased knowledge of precautions;Pain;Cardiopulmonary status limiting activity OT Treatment Interventions:     OT Goals(Current goals can be found in the care plan section) Acute Rehab OT Goals Patient Stated Goal: get back to playing games with his friends  Visit Information  Last OT Received On: 04/27/13 Assistance Needed: +2 History of Present Illness: s/p R THA       Prior Functioning     Home Living Family/patient expects to be discharged to:: Skilled nursing facility Living Arrangements: Alone Prior Function Level of Independence: Independent with assistive device(s) Comments: used a SPC Communication Communication: No difficulties         Vision/Perception     Cognition  Cognition Arousal/Alertness: Awake/alert Behavior During Therapy: WFL for tasks assessed/performed Overall Cognitive Status: Within Functional Limits for tasks assessed    Extremity/Trunk Assessment Upper Extremity Assessment Upper Extremity Assessment: Generalized weakness Lower Extremity Assessment Lower Extremity Assessment: Defer to PT evaluation     Mobility Bed Mobility Overal bed mobility: Needs Assistance Bed Mobility: Sit to Supine Sit to supine: Mod assist General bed mobility comments: Assistance with LE's.  Transfers Overall transfer level: Needs assistance Equipment used: Rolling walker (2 wheeled) Transfers: Sit to/from [facility] Sit to Stand: Max assist;Mod assist Stand pivot transfers: +2 physical assistance;Mod assist General transfer comment: Cues for technique.  +2 Total A/Mod A for stand pivot transfer.  Balance     End of Session OT - End of Session Equipment Utilized During Treatment: Gait belt;Rolling walker;Oxygen Activity Tolerance: Patient tolerated treatment well Patient left: in chair;with call bell/phone within reach Nurse Communication: Other (comment) (nurse assisted; notified of O2 sats)  GO     Benito Mccreedy OTR/L [phone] 04/27/2013, 1:59 PM

## 2013-04-27 NOTE — Progress Notes (Signed)
[facility] PROGRESS NOTE  DANIELLE LENTO WJX:[id number] DOB: [birth year] DOA: 04/24/2013 PCP: Kaleen Mask, MD  Assessment/Plan: Right Hip Fracture -For repair today. -Will  need SNF afterwards.  Food Impaction/Esophageal Stricture -Has a h/o esophageal stricture. -Had EGD last night with food disimpaction and dilatation. -Appreciate GI assistance.  Atrial Fibrillation -Coumadin was reversed for surgery. -Coumadin has been restarted. -No need for heparin bridge.  HTN -Continue home medications. -Fair control.  ?CAP -Afebrile/no leukocytosis. -Doubt infiltrates on CXR translate to clinical PNA. -Continue PO levaquin empirically for 7 days.  Chronic Diastolic CHF -Compensated at present.  Code Status: Full code Family Communication: Patient only Disposition Plan: SNF.   Consultants:  Ortho  GI   Antibiotics:  Levaquin  Subjective: No complaints today other than mild constipation.  Objective: Filed Vitals:   04/27/13 0959 04/27/13 1012 04/27/13 1033 04/27/13 1143  BP:   112/41   Pulse:   78   Temp:      TempSrc:      Resp:   18 18  Height:      Weight:      SpO2: 98% 98% 98% 99%    Intake/Output Summary (Last 24 hours) at 04/27/13 1243 Last data filed at 04/27/13 1218  Gross per 24 hour  Intake 4196.33 ml  Output    525 ml  Net 3671.33 ml   Filed Weights   04/25/13 0500 04/26/13 0532 04/27/13 0625  Weight: 84.777 kg (186 lb 14.4 oz) 88.179 kg (194 lb 6.4 oz) 95.21 kg (209 lb 14.4 oz)    Exam:   General:  AA Ox3  Cardiovascular: RRR  Respiratory: CTA B  Abdomen: S/NT/ND/+BS  Extremities: no C/C/E  Neurologic:  Non-focal.  Data Reviewed: Basic Metabolic Panel:  Recent Labs Lab 04/24/13 1437 04/26/13 0511 04/27/13 0540  NA 141 139 137  K 5.3 5.2 5.3  CL 105 106 105  CO2 22 23 21   GLUCOSE 101* 119* 113*  BUN 35* 34* 36*  CREATININE 1.75* 1.94* 2.15*  CALCIUM 8.7 8.1* 7.9*   Liver Function Tests: No  results found for this basename: AST, ALT, ALKPHOS, BILITOT, PROT, ALBUMIN,  in the last 168 hours No results found for this basename: LIPASE, AMYLASE,  in the last 168 hours No results found for this basename: AMMONIA,  in the last 168 hours CBC:  Recent Labs Lab 04/24/13 1437 04/25/13 0700 04/26/13 0511 04/27/13 0540  WBC 15.7* 16.1* 13.5* 15.7*  HGB 14.1 13.4 11.5* 10.3*  HCT 40.4 39.8 35.1* 30.7*  MCV 98.8 99.0 100.9* 100.3*  PLT 228 248 201 182   Cardiac Enzymes: No results found for this basename: CKTOTAL, CKMB, CKMBINDEX, TROPONINI,  in the last 168 hours BNP (last 3 results)  Recent Labs  12/19/12 1615  PROBNP 669.1*   CBG: No results found for this basename: GLUCAP,  in the last 168 hours  Recent Results (from the past 240 hour(s))  URINE CULTURE     Status: None   Collection Time    04/24/13  9:06 PM      Result Value Ref Range Status   Specimen Description URINE, CATHETERIZED   Final   Special Requests NONE   Final   Culture  Setup Time     Final   Value: 04/24/2013 21:38     Performed at [facility] Count     Final   Value: NO GROWTH     Performed at [facility]  Final   Value: NO GROWTH     Performed at [facility]   Report Status 04/25/2013 FINAL   Final  CULTURE, BLOOD (ROUTINE X 2)     Status: None   Collection Time    04/24/13 10:01 PM      Result Value Ref Range Status   Specimen Description BLOOD RIGHT ARM   Final   Special Requests BOTTLES DRAWN AEROBIC ONLY 9CC   Final   Culture  Setup Time     Final   Value: 04/25/2013 03:08     Performed at [facility]   Culture     Final   Value:        BLOOD CULTURE RECEIVED NO GROWTH TO DATE CULTURE WILL BE HELD FOR 5 DAYS BEFORE ISSUING A FINAL NEGATIVE REPORT     Performed at [facility]   Report Status PENDING   Incomplete  CULTURE, BLOOD (ROUTINE X 2)     Status: None   Collection Time    04/24/13 10:10 PM      Result Value  Ref Range Status   Specimen Description BLOOD LEFT HAND   Final   Special Requests BOTTLES DRAWN AEROBIC ONLY 7.5CC   Final   Culture  Setup Time     Final   Value: 04/25/2013 03:08     Performed at [facility]   Culture     Final   Value:        BLOOD CULTURE RECEIVED NO GROWTH TO DATE CULTURE WILL BE HELD FOR 5 DAYS BEFORE ISSUING A FINAL NEGATIVE REPORT     Performed at [facility]   Report Status PENDING   Incomplete  CULTURE, EXPECTORATED SPUTUM-ASSESSMENT     Status: None   Collection Time    04/25/13 12:30 AM      Result Value Ref Range Status   Specimen Description SPUTUM   Final   Special Requests NONE   Final   Sputum evaluation     Final   Value: MICROSCOPIC FINDINGS SUGGEST THAT THIS SPECIMEN IS NOT REPRESENTATIVE OF LOWER RESPIRATORY SECRETIONS. PLEASE RECOLLECT.     Results Called to: Josefa Half, RN [phone] at 0229  Dia Sitter   Report Status 04/25/2013 FINAL   Final  SURGICAL PCR SCREEN     Status: None   Collection Time    04/25/13  6:02 AM      Result Value Ref Range Status   MRSA, PCR NEGATIVE  NEGATIVE Final   Staphylococcus aureus NEGATIVE  NEGATIVE Final   Comment:            The Xpert SA Assay (FDA     approved for NASAL specimens     in patients over 37 years of age),     is one component of     a comprehensive surveillance     program.  Test performance has     been validated by [facility] for patients greater     than or equal to 84 year old.     It is not intended     to diagnose infection nor to     guide or monitor treatment.     Studies: Dg Pelvis Portable  04/25/2013   CLINICAL DATA:  Postop  EXAM: PORTABLE PELVIS 1-2 VIEWS  COMPARISON:  Preoperative radiographs of the pelvis and right hip 04/24/2013  FINDINGS: Interval surgical changes of right hip arthroplasty. Expected postsurgical change including new  Johnny BridgeMartha row cyst and subcutaneous emphysema. The inferior extent of the femoral stem prosthesis is not entirely imaged.  There is no periprosthetic fracture acute hardware complication along the imaged portion. The bony pelvis appears intact. Mild osteopenia. Unremarkable visualized bowel gas pattern.  IMPRESSION: Postoperative changes of right hip arthroplasty without evidence of immediate hardware complication.   Electronically Signed   By: Malachy MoanHeath  McCullough M.D.   On: 04/25/2013 18:47    Scheduled Meds: . atenolol  25 mg Oral Daily  . bisacodyl  10 mg Rectal Once  . budesonide (PULMICORT) nebulizer solution  0.25 mg Nebulization BID  . fentaNYL  50-100 mcg Intravenous Once  . flecainide  50 mg Oral BID  . latanoprost  1 drop Both Eyes QHS  . levofloxacin  500 mg Oral Q48H  . levothyroxine  50 mcg Oral QAC breakfast  . pantoprazole (PROTONIX) IV  40 mg Intravenous Q12H  . polyethylene glycol  17 g Oral Daily  . warfarin  3 mg Oral ONCE-1800  . Warfarin - Pharmacist Dosing Inpatient   Does not apply q1800   Continuous Infusions: . sodium chloride    . dextrose 5 % and 0.45 % NaCl with KCl 20 mEq/L 100 mL/hr at 04/27/13 1035    Principal Problem:   Closed right hip fracture Active Problems:   Hypertension   Hypothyroidism   Atrial fibrillation   COPD (chronic obstructive pulmonary disease)   Chronic anticoagulation   Chronic diastolic heart failure   Chronic kidney disease (CKD) stage G3a/A1, moderately decreased glomerular filtration rate (GFR) between 45-59 mL/min/1.73 square meter and albuminuria creatinine ratio less than 30 mg/g   CAP (community acquired pneumonia)   Leukocytosis   Food impaction of esophagus    Time spent: 35 minutes. Greater than 50% of this time was spent in direct contact with the patient coordinating care.    Chaya JanHERNANDEZ ACOSTA,ESTELA  [facility] Pager [phone]  If 7PM-7AM, please contact night-coverage at [email or web address], password [email or web address] 04/27/2013, 12:43 PM  LOS: 3 days

## 2013-04-27 NOTE — Progress Notes (Signed)
ANTICOAGULATION CONSULT NOTE - Follow Up Consult  Pharmacy Consult for Warfarin Indication: atrial fibrillation  Allergies  Allergen Reactions  . Diltiazem Hcl Er Other (See Comments)    Tachycardia  . Metoprolol Succinate Er [Metoprolol Tartrate] Other (See Comments)    hallucinations   Patient Measurements: Height: 5\' 8"  (172.7 cm) Weight: 209 lb 14.4 oz (95.21 kg) IBW/kg (Calculated) : 68.4 Heparin Dosing Weight: 85kg  Vital Signs: Temp: 97.7 F (36.5 C) (02/15 0510) Temp src: Oral (02/15 0510) BP: 113/48 mmHg (02/15 0510) Pulse Rate: 86 (02/15 0510)  Labs:  Recent Labs  04/24/13 1437 04/25/13 0700 04/25/13 1105 04/26/13 0511 04/26/13 1410 04/27/13 0540  HGB 14.1 13.4  --  11.5*  --  10.3*  HCT 40.4 39.8  --  35.1*  --  30.7*  PLT 228 248  --  201  --  182  LABPROT 23.1* 24.3* 25.1*  --  15.3* 15.1  INR 2.12* 2.27* 2.37*  --  1.24 1.22  HEPARINUNFRC  --  0.47  --  <0.10*  --   --   CREATININE 1.75*  --   --  1.94*  --  2.15*   Estimated Creatinine Clearance: 24.5 ml/min (by C-G formula based on Cr of 2.15).  Assessment: 89 YOM restarted on PTA coumadin s/p bipolar hip arthroplasty and EGD. He has some post-op anemia with slight trend down in H/H and platelets  and no noted bleeding complications.  He received 5mg  IV phytonadione at on 2/13.  INR relatively unchanged at 1.22 today after 1 dose of post op coumadin. RN reports no s/s of bleeding.  HOME Dose:  Warfarin 2.5mg  daily except 3.75 on MWF. Admit INR was 2.12   Drug/Drug Interactions:  Anticoagulants / Quinolones Hypoprothrombinemic effects of warfarin may be increased by levofloxacin. Dosage reduction of warfarin may be required.  Goal of Therapy:  INR 2.0-3.0 Monitor platelets by anticoagulation protocol: Yes   Plan:  1.  Coumadin 3 mg x 1 dose today  2.  F/U AM PT/INR 3.  Monitor for bleeding complications  Vinnie LevelBenjamin Hiral Lukasiewicz, PharmD.  Clinical Pharmacist Pager [phone]

## 2013-04-27 NOTE — Progress Notes (Signed)
Clinical Social Work Department BRIEF PSYCHOSOCIAL ASSESSMENT 04/27/2013  Patient:  Jordan [name],Jordan [name]     Account Number:  [id number]     Admit date:  04/24/2013  Clinical Social Worker:  Hendricks MiloMORGAN,Neeka Urista, LCSWA  Date/Time:  04/27/2013 07:02 PM  Referred by:  Physician  Date Referred:  04/26/2013 Referred for  SNF Placement   Other Referral:   Interview type:  Family Other interview type:    PSYCHOSOCIAL DATA Living Status:  WITH ADULT CHILDREN Admitted from facility:   Level of care:   Primary support name:  Jordan [name] Primary support relationship to patient:  CHILD, ADULT Degree of support available:   Good support, completed assessment with CSW over the phone.    CURRENT CONCERNS  Other Concerns:    SOCIAL WORK ASSESSMENT / PLAN Clinical Social Worker (CSW) contacted patient's daughter Jordan [name] listed on EPIC because patient was not alert and oriented. Daughter reported that she would like for patient to go to [facility] or a SNF in [place]. [facility] [place].   Assessment/plan status:  Psychosocial Support/Ongoing Assessment of Needs Other assessment/ plan:   Information/referral to community resources:    PATIENT'S/FAMILY'S RESPONSE TO PLAN OF CARE: Daughter thanked CSW for starting SNF search.

## 2013-04-27 NOTE — Progress Notes (Addendum)
Clinical Social Work Department CLINICAL SOCIAL WORK PLACEMENT NOTE 04/27/2013  Patient:  Jordan [name],Jordan [name]  Account Number:  [id number] Admit date:  04/24/2013  Clinical Social Worker:  Jetta LoutBAILEY MORGAN, Theresia MajorsLCSWA  Date/time:  04/27/2013 07:07 PM  Clinical Social Work is seeking post-discharge placement for this patient at the following level of care:   SKILLED NURSING   (*CSW will update this form in Epic as items are completed)   04/27/2013  Patient/family provided with [facility] Department of Clinical Social Work's list of facilities offering this level of care within the geographic area requested by the patient (or if unable, by the patient's family).  04/27/2013  Patient/family informed of their freedom to choose among providers that offer the needed level of care, that participate in Medicare, Medicaid or managed care program needed by the patient, have an available bed and are willing to accept the patient.  04/27/2013  Patient/family informed of [facility]' ownership interest in [facility], as well as of the fact that they are under no obligation to receive care at this facility.  PASARR submitted to EDS on 04/27/2013 PASARR number received from EDS on 04/27/2013  FL2 transmitted to all facilities in geographic area requested by pt/family on  04/27/2013 FL2 transmitted to all facilities within larger geographic area on   Patient informed that his/her managed care company has contracts with or will negotiate with  certain facilities, including the following:     Patient/family informed of bed offers received:  04/28/2013 [facility] Physician recommends and patient chooses bed at    Patient to be transferred to  on  04/28/2013 [facility]  The following physician request were entered in Epic:   Additional Comments:

## 2013-04-27 NOTE — Progress Notes (Signed)
GASTROENTEROLOGY PROGRESS NOTE  Problem:   1. Esophageal ring/stricture, dysphagia, food impaction status post removal last night 2. Constipation (no BM for past 48 hours)  Subjective: 1. The patient avidly consumed his clear liquid breakfast without difficulty 2. No bowel movements for the past 48 hours. He is currently sitting on the bedside commode but has not yet had a movement  Objective: Appears in good spirits, afebrile following last night's procedure which included an attempt under moderate sedation and then general anesthesia for food impaction removal (please see dictated operative report)  Assessment: 1. Resolution of the impaction. Patient is at risk for recurrent impactions. Detailed instructions were provided to the family, and I also spoke with the patient about the need to chop his food into very fine pieces 2. Possible mild constipation, but this is not unexpected following surgery and given the fact that he has not eaten for the past 2 days  Plan: 1. Instruction provided as noted above. Will order dysphagia 3 diet and ask for dietitian consultation to instruct patient in finely chopped food, eating very small boluses, eating slowly, interspersed in swallows of liquids with solid food, etc. since he will always have a persistent esophageal stricture that was only partially dilated last night  2. Dulcolax suppository ordered. Will start MiraLAX while in house, but I doubt he will need it as an outpatient  3. We will sign off. Please call if we can be of further assistance in this patient's care  Florencia Reasonsobert V. Aaryn Sermon, M.D. 04/27/2013 11:28 AM

## 2013-04-27 NOTE — Progress Notes (Signed)
Physical Therapy Treatment Patient Details Name: Jordan BeatRalph B Bearse MRN: [id number] DOB: February 07, 1919 Today's Date: 04/27/2013 Time: [id number] PT Time Calculation (min): 27 min  PT Assessment / Plan / Recommendation  History of Present Illness s/p R THA   PT Comments   Pt limited by shortness of breath during treatment, fatigues easily with mobility but is motivated to improve.  Pt will continue to benefit from SNF for continued rehab after acute care stay.  Follow Up Recommendations  SNF     Does the patient have the potential to tolerate intense rehabilitation     Barriers to Discharge        Equipment Recommendations  None recommended by PT    Recommendations for Other Services    Frequency 7X/week   Progress towards PT Goals Progress towards PT goals: Progressing toward goals  Plan Current plan remains appropriate    Precautions / Restrictions Precautions Precautions: Posterior Hip Restrictions RLE Weight Bearing: Weight bearing as tolerated   Pertinent Vitals/Pain No c/o pain, pt c/o "I'm so weak"    Mobility  Bed Mobility Overal bed mobility: Needs Assistance Supine to sit: Min assist General bed mobility comments: assist for R LE and to move hips to edge of bed Transfers Overall transfer level: Needs assistance Equipment used: Rolling walker (2 wheeled) Sit to Stand: Min assist;Mod assist Stand pivot transfers: Min assist General transfer comment: pt requires lifting assist to stand, cues for hand placement, cues for deep breathing during transfers.  pt requires mod A today for stand to sit for eccentric control, cues for safety with lowering himself to chair especially when fatigued Ambulation/Gait Ambulation/Gait assistance: Min assist Ambulation Distance (Feet): 10 Feet Assistive device: Rolling walker (2 wheeled) Gait velocity interpretation: Below normal speed for age/gender General Gait Details: pt requires min A for steadying with gait, able to gait 5', 10'  with RW cues for deep breathing.  Pt on 3LO2 throughout session with spO2 > 95%.  Pt with shortness of breath with gait, RN aware    Exercises Total Joint Exercises Ankle Circles/Pumps: AROM;Both;20 reps Short Arc Quad: AROM;Right;20 reps Heel Slides: AAROM;10 reps;Right Other Exercises Other Exercises: isometric hip adduction squeeze 2 x 10   PT Diagnosis:    PT Problem List:   PT Treatment Interventions:     PT Goals (current goals can now be found in the care plan section)    Visit Information  Last PT Received On: 04/27/13 Assistance Needed: +1 History of Present Illness: s/p R THA    Subjective Data      Cognition  Cognition Arousal/Alertness: Awake/alert Behavior During Therapy: WFL for tasks assessed/performed Overall Cognitive Status: Within Functional Limits for tasks assessed    Balance     End of Session PT - End of Session Equipment Utilized During Treatment: Gait belt;Oxygen Activity Tolerance: Patient limited by fatigue Patient left: in chair;with call bell/phone within reach Nurse Communication: Mobility status   GP     Cassius Cullinane 04/27/2013, 10:43 AM

## 2013-04-28 ENCOUNTER — Encounter (HOSPITAL_COMMUNITY): Payer: Self-pay | Admitting: Gastroenterology

## 2013-04-28 LAB — GLUCOSE, CAPILLARY: Glucose-Capillary: 112 mg/dL — ABNORMAL HIGH (ref 70–99)

## 2013-04-28 LAB — CBC
HEMATOCRIT: 30.4 % — AB (ref 39.0–52.0)
Hemoglobin: 9.9 g/dL — ABNORMAL LOW (ref 13.0–17.0)
MCH: 33.1 pg (ref 26.0–34.0)
MCHC: 32.6 g/dL (ref 30.0–36.0)
MCV: 101.7 fL — ABNORMAL HIGH (ref 78.0–100.0)
Platelets: 190 10*3/uL (ref 150–400)
RBC: 2.99 MIL/uL — ABNORMAL LOW (ref 4.22–5.81)
RDW: 13.6 % (ref 11.5–15.5)
WBC: 13.9 10*3/uL — AB (ref 4.0–10.5)

## 2013-04-28 LAB — PROTIME-INR
INR: 1.2 (ref 0.00–1.49)
PROTHROMBIN TIME: 14.9 s (ref 11.6–15.2)

## 2013-04-28 MED ORDER — LEVOFLOXACIN 500 MG PO TABS: 500.0000 mg | ORAL_TABLET | ORAL | Status: DC

## 2013-04-28 MED ORDER — HYDROCODONE-ACETAMINOPHEN 5-325 MG PO TABS: 1.0000 | ORAL_TABLET | Freq: Four times a day (QID) | ORAL | Status: DC | PRN

## 2013-04-28 MED ORDER — WARFARIN SODIUM 4 MG PO TABS
4.0000 mg | ORAL_TABLET | Freq: Once | ORAL | Status: DC
  Filled 2013-04-28: qty 1

## 2013-04-28 MED ORDER — TIZANIDINE HCL 2 MG PO CAPS: 2.0000 mg | ORAL_CAPSULE | Freq: Three times a day (TID) | ORAL | Status: DC

## 2013-04-28 NOTE — Anesthesia Postprocedure Evaluation (Signed)
Anesthesia Post Note  Patient: Jordan [name]  Procedure(s) Performed: Procedure(s) (LRB): ARTHROPLASTY BIPOLAR HIP (Right)  Anesthesia type: general  Patient location: PACU  Post pain: Pain level controlled  Post assessment: Patient's Cardiovascular Status Stable  Post vital signs: Reviewed and stable  Level of consciousness: sedated  Complications: No apparent anesthesia complications

## 2013-04-28 NOTE — Care Management Note (Signed)
CARE MANAGEMENT NOTE 04/28/2013  Patient:  Jordan [name],Jordan [name]   Account Number:  [id number]  Date Initiated:  04/28/2013  Documentation initiated by:  Vance PeperBRADY,Kimbely Whiteaker  Subjective/Objective Assessment:   [age] yrold male s/p right hip hemiarthroplasty.     Action/Plan:   Patient for shortterm rehab at SNF. Social worker is aware. Case Manager provided family with private duty list.   Anticipated DC Date:  04/28/2013   Anticipated DC Plan:  SKILLED NURSING FACILITY  In-house referral  Clinical Social Worker      DC Planning Services  CM consult      Choice offered to / List presented to:  NA           Status of service:  Completed, signed off Medicare Important Message given?   (If response is "NO", the following Medicare IM given date fields will be blank) Date Medicare IM given:   Date Additional Medicare IM given:    Discharge Disposition:  SKILLED NURSING FACILITY

## 2013-04-28 NOTE — Progress Notes (Signed)
PATIENT ID: Jordan [name]  MRN: [id number]  DOB/AGE:  [birth year] / 80 y.o.  3 days Post-OPARTHROPLASTY BIPOLAR HIP (Right   2 Days Post-Op Procedure(s) (LRB): ESOPHAGOGASTRODUODENOSCOPY (EGD) WITH ESOPHAGEAL DILATION (N/A)    PROGRESS NOTE Subjective: Patient is alert, oriented,no Nausea, no Vomiting, yes passing gas, no Bowel Movement. Taking PO well. Denies SOB, Chest or Calf Pain. Using Incentive Spirometer, PAS in place. Ambulate WBAT Patient reports pain as mild  .    Objective: Vital signs in last 24 hours: Filed Vitals:   04/27/13 1627 04/27/13 2139 04/28/13 0300 04/28/13 0549  BP:  104/43  103/55  Pulse:  70  72  Temp:  97.5 F (36.4 C)  97.8 F (36.6 C)  TempSrc:  Oral  Oral  Resp: 18 19  18   Height:      Weight:   97.3 kg (214 lb 8.1 oz)   SpO2: 98% 100%  99%      Intake/Output from previous day: I/O last 3 completed shifts: In: 6910 [P.O.:3440; I.V.:3470] Out: 1050 [Urine:1050]   Intake/Output this shift:     LABORATORY DATA:  Recent Labs  04/26/13 0511 04/26/13 1410 04/27/13 0540  WBC 13.5*  --  15.7*  HGB 11.5*  --  10.3*  HCT 35.1*  --  30.7*  PLT 201  --  182  NA 139  --  137  K 5.2  --  5.3  CL 106  --  105  CO2 23  --  21  BUN 34*  --  36*  CREATININE 1.94*  --  2.15*  GLUCOSE 119*  --  113*  INR  --  1.24 1.22  CALCIUM 8.1*  --  7.9*    Examination: Neurologically intact Neurovascular intact Sensation intact distally Intact pulses distally Dorsiflexion/Plantar flexion intact Incision: scant drainage No cellulitis present Compartment soft} XR AP&Lat of hip shows well placed\fixed THA  Assessment:    3 days Post-OPARTHROPLASTY BIPOLAR HIP (Right  2 Days Post-Op Procedure(s) (LRB): ESOPHAGOGASTRODUODENOSCOPY (EGD) WITH ESOPHAGEAL DILATION (N/A) ADDITIONAL DIAGNOSIS:  Hypertension, Cardiac Arrythmia AFIB and chronic diastolic CHF.  Plan: PT/OT WBAT, THA  posterior precautions  DVT Prophylaxis: SCDx72 hrs, Restart Pre-op  coumadin  DISCHARGE PLAN: Skilled Nursing Facility/Rehab when cleared by medicine.  DISCHARGE NEEDS: HHPT, HHRN, Walker and 3-in-1 comode seat

## 2013-04-28 NOTE — Progress Notes (Signed)
Brief Nutrition Note:  Consult received for pt with hx of esophageal stricture.  Pt being d/c'ed to SNF shortly.  Pt on Dysphagia 3 diet and has mostly tolerated this. Pt not tolerating meats very well. Spoke with pt, wife and daughter. Encouraged dysphagia 2 diet (minced), spoke to RN and case manager about this.  Encouraged moist foods adding extra gravy and sauces as needed. Encouraged pt utilize small utensils for smaller bites.  No further questions at this time.   Kendell BaneHeather Destenie Ingber RD, LDN, CNSC [phone] Pager [phone] After Hours Pager

## 2013-04-28 NOTE — Progress Notes (Signed)
Clinical social worker assisted with patient discharge to skilled nursing facility, [facility].  CSW addressed all family questions and concerns. CSW copied chart and added all important documents. CSW also set up patient transportation with [facility]. Clinical Social Worker will sign off for now as social work intervention is no longer needed.   Margaux Engen, MSW, LCSWA [phone] 

## 2013-04-28 NOTE — Progress Notes (Signed)
Physical Therapy Treatment Patient Details Name: Jordan [name] MRN: [id number] DOB: [birth year] Today's Date: 04/28/2013 Time: 4098-1191 PT Time Calculation (min): 21 min  PT Assessment / Plan / Recommendation  History of Present Illness s/p R THA   PT Comments   Pt progressing towards physical therapy goals. Audible wheezing throughout session on 4L/min supplemental O2. Pt cued for pursed-lip breathing but was unable to perform the technique appropriately. RN noted he had not had breathing treatment this morning and would order it. Pt and family expect d/c to SNF today.   Follow Up Recommendations  SNF     Does the patient have the potential to tolerate intense rehabilitation     Barriers to Discharge        Equipment Recommendations  None recommended by PT    Recommendations for Other Services    Frequency 7X/week   Progress towards PT Goals Progress towards PT goals: Progressing toward goals  Plan Current plan remains appropriate    Precautions / Restrictions Precautions Precautions: Posterior Hip;Fall Precaution Comments: Reviewed precautions with pt and family present Restrictions Weight Bearing Restrictions: Yes RLE Weight Bearing: Weight bearing as tolerated   Pertinent Vitals/Pain Pt reports some pain in RLE however does not rate on 0-10 scale when asked.     Mobility  Bed Mobility Overal bed mobility: Needs Assistance Bed Mobility: Supine to Sit Supine to sit: Min assist;HOB elevated General bed mobility comments: Assist with LE movement and support, as well as for trunk elevation to come to full sitting on EOB. Pt able to scoot himself out so that both feet were on the floor.  Transfers Overall transfer level: Needs assistance Equipment used: Rolling walker (2 wheeled) Transfers: Sit to/from Stand Sit to Stand: Mod assist;+2 physical assistance General transfer comment: VC's for sequencing and technique. Hand-over-hand assist to reach for arm rests on chair  prior to sit.  Ambulation/Gait Ambulation/Gait assistance: Min assist Ambulation Distance (Feet): 12 Feet Assistive device: Rolling walker (2 wheeled) Gait Pattern/deviations: Step-to pattern;Decreased stride length;Trunk flexed Gait velocity: Decreased Gait velocity interpretation: Below normal speed for age/gender General Gait Details: Pt requires assist for walker placement and steadying with each step. Pt on 4L/min supplemental O2 throughout session, and pt with audible wheezing throughout.    Exercises Total Joint Exercises Ankle Circles/Pumps: 10 reps Quad Sets: 10 reps Short Arc Quad: 10 reps Heel Slides: 10 reps Hip ABduction/ADduction: 10 reps Long Arc Quad: 10 reps   PT Diagnosis:    PT Problem List:   PT Treatment Interventions:     PT Goals (current goals can now be found in the care plan section) Acute Rehab PT Goals Patient Stated Goal: get back to playing games with his friends PT Goal Formulation: With patient Time For Goal Achievement: 05/03/13 Potential to Achieve Goals: Good  Visit Information  Last PT Received On: 04/28/13 Assistance Needed: +2 History of Present Illness: s/p R THA    Subjective Data  Subjective: Pt agreeable to OOB with therapy Patient Stated Goal: get back to playing games with his friends   Cognition  Cognition Arousal/Alertness: Awake/alert Behavior During Therapy: WFL for tasks assessed/performed Overall Cognitive Status: Within Functional Limits for tasks assessed    Balance  Balance Overall balance assessment: Needs assistance Sitting-balance support: Feet supported;Bilateral upper extremity supported Sitting balance-Leahy Scale: Fair Standing balance support: Bilateral upper extremity supported Standing balance-Leahy Scale: Poor  End of Session PT - End of Session Equipment Utilized During Treatment: Gait belt;Oxygen Activity Tolerance: Patient limited by fatigue  Patient left: in chair;with call bell/phone within  reach Nurse Communication: Mobility status   GP     Ruthann CancerHamilton, Stori Royse 04/28/2013, 11:46 AM  Ruthann CancerLaura Hamilton, PT, DPT [phone]

## 2013-04-28 NOTE — Discharge Summary (Signed)
Physician Discharge Summary  HANAD LEINO WUJ:[id number] DOB: [birth year] DOA: 04/24/2013  PCP: Kaleen Mask, MD  Admit date: 04/24/2013 Discharge date: 04/28/2013  Time spent: 45 minutes  Recommendations for Outpatient Follow-up:  -Will be discharged to SNF today. -Needs to remain on a mechanical soft diet, finely chopped meats.   Discharge Diagnoses:  Principal Problem:   Closed right hip fracture Active Problems:   Hypertension   Hypothyroidism   Atrial fibrillation   COPD (chronic obstructive pulmonary disease)   Chronic anticoagulation   Chronic diastolic heart failure   Chronic kidney disease (CKD) stage G3a/A1, moderately decreased glomerular filtration rate (GFR) between 45-59 mL/min/1.73 square meter and albuminuria creatinine ratio less than 30 mg/g   CAP (community acquired pneumonia)   Leukocytosis   Food impaction of esophagus   Discharge Condition: Stable and improved  Filed Weights   04/26/13 0532 04/27/13 0625 04/28/13 0300  Weight: 88.179 kg (194 lb 6.4 oz) 95.21 kg (209 lb 14.4 oz) 97.3 kg (214 lb 8.1 oz)    History of present illness:  MAXUM CASSARINO is a 80 y.o. male with PMH significant for COPD, diastolic heart failure; atrial fibrillation (on coumadin), CKD (stage 3) and hypothyroidism; came to ED complaining of right hip pain and decrease movement of right leg after mechanical fall. Patient found with closed right hip fracture on x-ray; he was also seen to be mildly hypoxic, with elevated WBC's and CXR demonstrated superimposed new infiltrates on Left Lower lobes. Patient denies fever, chills, chest pain, nausea, vomiting, HS's, palpitation, melena, hematochezia or any other acute complaints. [facility] called to admit patient and orthopedic service consulted for R hip fracture.   Hospital Course:   Right Hip Fracture  -s/p repair 04/26/13. -To SNF today.  Food Impaction/Esophageal Stricture  -Has a h/o esophageal stricture.  -Had EGD 2/14 with  food disimpaction and dilatation.  -Appreciate GI assistance. -Will need to continue a mechanical soft diet.   Atrial Fibrillation  -Coumadin was reversed for surgery.  -Coumadin has been restarted.  -No need for heparin bridge.  -Follow INR and adjust coumadin dosing as needed for a goal INR 2-3.  HTN  -Continue home medications.  -Fair control.   ?CAP  -Afebrile/no leukocytosis.  -Doubt infiltrates on CXR translate to clinical PNA.  -Continue PO levaquin empirically for 7 days. (5 days remaining on DC).  Chronic Diastolic CHF  -Compensated at present.   Procedures:  Hip repair   Consultations:  Ortho  GI  Discharge Instructions  Discharge Orders   Future Appointments Provider Department Dept Phone   05/16/2013 2:15 PM [facility] [place] [facility] [phone]   Future Orders Complete By Expires   Diet - low sodium heart healthy  As directed    Discontinue IV  As directed    Increase activity slowly  As directed    Weight bearing as tolerated  As directed    Questions:     Laterality:     Extremity:         Medication List    STOP taking these medications       TYLENOL EXTRA STRENGTH PO      TAKE these medications       albuterol 108 (90 BASE) MCG/ACT inhaler  Commonly known as:  PROVENTIL HFA;VENTOLIN HFA  Inhale 2 puffs into the lungs every 6 (six) hours as needed for wheezing.     atenolol 25 MG tablet  Commonly known as:  TENORMIN  Take 1 tablet (  25 mg total) by mouth daily.     flecainide 100 MG tablet  Commonly known as:  TAMBOCOR  Take 50 mg by mouth 2 (two) times daily.     furosemide 20 MG tablet  Commonly known as:  LASIX  Take 40 mg by mouth daily as needed for fluid.     HYDROcodone-acetaminophen 5-325 MG per tablet  Commonly known as:  NORCO  Take 1 tablet by mouth every 6 (six) hours as needed for moderate pain.     latanoprost 0.005 % ophthalmic solution  Commonly known as:  XALATAN  Place 1  drop into both eyes at bedtime.     levofloxacin 500 MG tablet  Commonly known as:  LEVAQUIN  Take 1 tablet (500 mg total) by mouth every other day. For 5 more days     levothyroxine 100 MCG tablet  Commonly known as:  SYNTHROID, LEVOTHROID  Take 50 mcg by mouth daily before breakfast.     omeprazole 20 MG capsule  Commonly known as:  PRILOSEC  Take 20 mg by mouth daily.     tizanidine 2 MG capsule  Commonly known as:  ZANAFLEX  Take 1 capsule (2 mg total) by mouth 3 (three) times daily.     warfarin 2.5 MG tablet  Commonly known as:  COUMADIN  Take 1 tablet on all days except 1.5 tablets on Monday, Wednesday, and Friday; or as directed by [facility] Clinic.       Allergies  Allergen Reactions  . Diltiazem Hcl Er Other (See Comments)    Tachycardia  . Metoprolol Succinate Er [Metoprolol Tartrate] Other (See Comments)    hallucinations       Follow-up Information   Follow up with Nestor Lewandowsky, MD In 2 weeks.   Specialty:  Orthopedic Surgery   Contact information:   [address] [place] [address] [phone]        The results of significant diagnostics from this hospitalization (including imaging, microbiology, ancillary and laboratory) are listed below for reference.    Significant Diagnostic Studies: Dg Chest 1 View  04/24/2013   CLINICAL DATA:  Larey Seat this morning. Right hip fracture. Preoperative respiratory evaluation. Shortness of breath.  EXAM: CHEST - 1 VIEW  COMPARISON:  DG CHEST 1V PORT dated 12/16/2012; CT CHEST W/O CM dated 06/13/2007; CT CHEST W/CM dated 12/01/2006; DG CHEST 2 VIEW dated 12/08/2004  FINDINGS: Focal opacity at the right lung base, increased since the prior examinations. The prior CT showed new interstitial fibrosis in the right lower lobe. Mild hyperinflation and mild emphysematous changes in the upper lobes, unchanged. Lungs otherwise clear. No pleural effusions. No pneumothorax. Normal pulmonary vascularity. Cardiac silhouette mildly  enlarged but stable.  IMPRESSION: 1. Increased opacity at the right lung base compared to prior examinations, with baseline interstitial fibrosis in the right lower lobe. This could represent worsening fibrosis or a superimposed bronchopneumonia. Are there clinical findings to suggest pneumonia? 2. Stable COPD/emphysema. No acute cardiopulmonary disease otherwise. 3. Stable mild cardiomegaly without pulmonary edema.   Electronically Signed   By: Hulan Saas M.D.   On: 04/24/2013 16:22   Dg Hip Complete Right  04/24/2013   CLINICAL DATA:  Fall.  Right hip pain.  EXAM: RIGHT HIP - COMPLETE 2+ VIEW  COMPARISON:  None.  FINDINGS: The patient has a subcapital right hip fracture. No other acute bony or joint abnormality is identified. Bones are osteopenic.  IMPRESSION: Acute subcapital right hip fracture.   Electronically Signed   By: Maisie Fus  Dalessio M.D.   On: 04/24/2013 16:18   Dg Pelvis Portable  04/25/2013   CLINICAL DATA:  Postop  EXAM: PORTABLE PELVIS 1-2 VIEWS  COMPARISON:  Preoperative radiographs of the pelvis and right hip 04/24/2013  FINDINGS: Interval surgical changes of right hip arthroplasty. Expected postsurgical change including new Johnny BridgeMartha row cyst and subcutaneous emphysema. The inferior extent of the femoral stem prosthesis is not entirely imaged. There is no periprosthetic fracture acute hardware complication along the imaged portion. The bony pelvis appears intact. Mild osteopenia. Unremarkable visualized bowel gas pattern.  IMPRESSION: Postoperative changes of right hip arthroplasty without evidence of immediate hardware complication.   Electronically Signed   By: Malachy MoanHeath  McCullough M.D.   On: 04/25/2013 18:47    Microbiology: Recent Results (from the past 240 hour(s))  URINE CULTURE     Status: None   Collection Time    04/24/13  9:06 PM      Result Value Ref Range Status   Specimen Description URINE, CATHETERIZED   Final   Special Requests NONE   Final   Culture  Setup Time      Final   Value: 04/24/2013 21:38     Performed at [facility]   Colony Count     Final   Value: NO GROWTH     Performed at [facility]   Culture     Final   Value: NO GROWTH     Performed at [facility]   Report Status 04/25/2013 FINAL   Final  CULTURE, BLOOD (ROUTINE X 2)     Status: None   Collection Time    04/24/13 10:01 PM      Result Value Ref Range Status   Specimen Description BLOOD RIGHT ARM   Final   Special Requests BOTTLES DRAWN AEROBIC ONLY 9CC   Final   Culture  Setup Time     Final   Value: 04/25/2013 03:08     Performed at [facility]   Culture     Final   Value:        BLOOD CULTURE RECEIVED NO GROWTH TO DATE CULTURE WILL BE HELD FOR 5 DAYS BEFORE ISSUING A FINAL NEGATIVE REPORT     Performed at [facility]   Report Status PENDING   Incomplete  CULTURE, BLOOD (ROUTINE X 2)     Status: None   Collection Time    04/24/13 10:10 PM      Result Value Ref Range Status   Specimen Description BLOOD LEFT HAND   Final   Special Requests BOTTLES DRAWN AEROBIC ONLY 7.5CC   Final   Culture  Setup Time     Final   Value: 04/25/2013 03:08     Performed at [facility]   Culture     Final   Value:        BLOOD CULTURE RECEIVED NO GROWTH TO DATE CULTURE WILL BE HELD FOR 5 DAYS BEFORE ISSUING A FINAL NEGATIVE REPORT     Performed at [facility]   Report Status PENDING   Incomplete  CULTURE, EXPECTORATED SPUTUM-ASSESSMENT     Status: None   Collection Time    04/25/13 12:30 AM      Result Value Ref Range Status   Specimen Description SPUTUM   Final   Special Requests NONE   Final   Sputum evaluation     Final   Value: MICROSCOPIC FINDINGS SUGGEST THAT THIS SPECIMEN IS NOT REPRESENTATIVE OF LOWER RESPIRATORY  SECRETIONS. PLEASE RECOLLECT.     Results Called to: Josefa Half, RN [id number] at 0229  Dia Sitter   Report Status 04/25/2013 FINAL   Final  SURGICAL PCR SCREEN     Status: None   Collection Time    04/25/13  6:02  AM      Result Value Ref Range Status   MRSA, PCR NEGATIVE  NEGATIVE Final   Staphylococcus aureus NEGATIVE  NEGATIVE Final   Comment:            The Xpert SA Assay (FDA     approved for NASAL specimens     in patients over 43 years of age),     is one component of     a comprehensive surveillance     program.  Test performance has     been validated by [employer] for patients greater     than or equal to 19 year old.     It is not intended     to diagnose infection nor to     guide or monitor treatment.     Labs: Basic Metabolic Panel:  Recent Labs Lab 04/24/13 1437 04/26/13 0511 04/27/13 0540  NA 141 139 137  K 5.3 5.2 5.3  CL 105 106 105  CO2 22 23 21   GLUCOSE 101* 119* 113*  BUN 35* 34* 36*  CREATININE 1.75* 1.94* 2.15*  CALCIUM 8.7 8.1* 7.9*   Liver Function Tests: No results found for this basename: AST, ALT, ALKPHOS, BILITOT, PROT, ALBUMIN,  in the last 168 hours No results found for this basename: LIPASE, AMYLASE,  in the last 168 hours No results found for this basename: AMMONIA,  in the last 168 hours CBC:  Recent Labs Lab 04/24/13 1437 04/25/13 0700 04/26/13 0511 04/27/13 0540  WBC 15.7* 16.1* 13.5* 15.7*  HGB 14.1 13.4 11.5* 10.3*  HCT 40.4 39.8 35.1* 30.7*  MCV 98.8 99.0 100.9* 100.3*  PLT 228 248 201 182   Cardiac Enzymes: No results found for this basename: CKTOTAL, CKMB, CKMBINDEX, TROPONINI,  in the last 168 hours BNP: BNP (last 3 results)  Recent Labs  12/19/12 1615  PROBNP 669.1*   CBG: No results found for this basename: GLUCAP,  in the last 168 hours     Signed:  Chaya Jan  [facility] Pager: [phone] 04/28/2013, 8:53 AM

## 2013-04-28 NOTE — Progress Notes (Signed)
ANTICOAGULATION CONSULT NOTE - Follow Up Consult  Pharmacy Consult for Warfarin Indication: atrial fibrillation  Allergies  Allergen Reactions  . Diltiazem Hcl Er Other (See Comments)    Tachycardia  . Metoprolol Succinate Er [Metoprolol Tartrate] Other (See Comments)    hallucinations   Patient Measurements: Height: 5\' 8"  (172.7 cm) Weight: 214 lb 8.1 oz (97.3 kg) IBW/kg (Calculated) : 68.4 Heparin Dosing Weight: 85kg  Vital Signs: Temp: 97.8 F (36.6 C) (02/16 0549) Temp src: Oral (02/16 0549) BP: 103/55 mmHg (02/16 0549) Pulse Rate: 72 (02/16 0549)  Labs:  Recent Labs  04/26/13 0511 04/26/13 1410 04/27/13 0540 04/28/13 0920  HGB 11.5*  --  10.3* 9.9*  HCT 35.1*  --  30.7* 30.4*  PLT 201  --  182 190  LABPROT  --  15.3* 15.1 14.9  INR  --  1.24 1.22 1.20  HEPARINUNFRC <0.10*  --   --   --   CREATININE 1.94*  --  2.15*  --    Estimated Creatinine Clearance: 24.8 ml/min (by C-G formula based on Cr of 2.15).  Assessment: 89 YOM restarted on PTA coumadin s/p bipolar hip arthroplasty and EGD. He has some post-op anemia with slight trend down in H/H and platelets  and no noted bleeding complications.  He received 5mg  IV phytonadione at on 2/13.  INR relatively unchanged at 1.2 today d/t holding coumadin on 2/12 and 2/13 and vitamin K. RN reports no s/s of bleeding.  HOME Dose:  Warfarin 2.5mg  daily except 3.75 on MWF. Admit INR was 2.12  Plan for discharge today  Goal of Therapy:  INR 2.0-3.0 Monitor platelets by anticoagulation protocol: Yes   Plan:  1.  Coumadin 4 mg x 1, continue home dose from tomorrow if goes home. 2.  F/U AM PT/INR 3.  Monitor for bleeding complications  Bayard HuggerMei Akshith Moncus, PharmD, BCPS  Clinical Pharmacist  Pager: [phone]

## 2013-04-30 ENCOUNTER — Encounter (HOSPITAL_COMMUNITY): Payer: Self-pay | Admitting: Orthopedic Surgery

## 2013-04-30 ENCOUNTER — Non-Acute Institutional Stay (SKILLED_NURSING_FACILITY): Payer: Medicare Other | Admitting: Adult Health

## 2013-04-30 DIAGNOSIS — E039 Hypothyroidism, unspecified: Secondary | ICD-10-CM

## 2013-04-30 DIAGNOSIS — I5032 Chronic diastolic (congestive) heart failure: Secondary | ICD-10-CM

## 2013-04-30 DIAGNOSIS — I4891 Unspecified atrial fibrillation: Secondary | ICD-10-CM

## 2013-04-30 DIAGNOSIS — J189 Pneumonia, unspecified organism: Secondary | ICD-10-CM

## 2013-04-30 DIAGNOSIS — I10 Essential (primary) hypertension: Secondary | ICD-10-CM

## 2013-04-30 DIAGNOSIS — S72001A Fracture of unspecified part of neck of right femur, initial encounter for closed fracture: Secondary | ICD-10-CM

## 2013-04-30 DIAGNOSIS — S72009A Fracture of unspecified part of neck of unspecified femur, initial encounter for closed fracture: Secondary | ICD-10-CM

## 2013-04-30 NOTE — Progress Notes (Signed)
Patient ID: [name], male   DOB: [birth year], 80 y.o.   MRN: [id number]               PROGRESS NOTE  DATE: 04/30/2013  FACILITY: Nursing Home [facility]  LEVEL OF CARE: SNF (31)  Acute Visit  CHIEF COMPLAINT:  Follow-up Hospitalization  HISTORY OF PRESENT ILLNESS: This is a 80 year old male who [facility] with [facility] fracture S/P repair. He has been admitted for a short-term rehabilitation.  REASSESSMENT OF ONGOING PROBLEM(S):  CHF:The patient does not relate significant weight changes, denies sob, DOE, orthopnea, PNDs, pedal edema, palpitations or chest pain.  CHF remains stable.  No complications form the medications being used.  HYPOTHYROIDISM: The hypothyroidism remains stable. No complications noted from the medications presently being used.  The patient denies fatigue or constipation.  10/14  TSH 0.821  HTN: Pt 's HTN remains stable.  Denies CP, sob, DOE, pedal edema, headaches, dizziness or visual disturbances.  No complications from the medications currently being used.  Last BP : 108/54   PAST MEDICAL HISTORY : Reviewed.  No changes.  CURRENT MEDICATIONS: [facility]  REVIEW OF SYSTEMS:  GENERAL: no change in appetite, no fatigue, no weight changes, no fever, chills or weakness RESPIRATORY: no cough, SOB, DOE, wheezing, hemoptysis CARDIAC: no chest pain, edema or palpitations GI: no abdominal pain, diarrhea, constipation, heart burn, nausea or vomiting  PHYSICAL EXAMINATION  VS:  See VS section  GENERAL: no acute distress EYES: conjunctivae normal, sclerae normal, normal eye lids NECK: supple, trachea midline, no neck masses, no thyroid tenderness, no thyromegaly LYMPHATICS: no LAN in the neck, no supraclavicular LAN RESPIRATORY: breathing is even & unlabored, BS CTAB CARDIAC: RRR, no murmur,no extra heart sounds, no edema GI: abdomen soft, normal BS, no masses,  no tenderness, no hepatomegaly, no splenomegaly PSYCHIATRIC: the patient is alert & oriented to person, affect & behavior appropriate  LABS/RADIOLOGY: Labs reviewed: Basic Metabolic Panel:  Recent Labs  40/98/1108/08/24 1707 12/17/12 0232  04/24/13 1437 04/26/13 0511 04/27/13 0540  NA 144  --   < > 141 139 137  K 4.5  --   < > 5.3 5.2 5.3  CL 113*  --   < > 105 106 105  CO2  --   --   < > 22 23 21   GLUCOSE 114*  --   < > 101* 119* 113*  BUN 61*  --   < > 35* 34* 36*  CREATININE 2.00* 1.67*  < > 1.75* 1.94* 2.15*  CALCIUM  --   --   < > 8.7 8.1* 7.9*  MG  --  1.5  --   --   --   --   < > = values in this interval not displayed.  CBC:  Recent Labs  04/26/13 0511 04/27/13 0540 04/28/13 0920  WBC 13.5* 15.7* 13.9*  HGB 11.5* 10.3* 9.9*  HCT 35.1* 30.7* 30.4*  MCV 100.9* 100.3* 101.7*  PLT 201 182 190    Recent Labs  04/28/13 1112  GLUCAP 112*   EXAM:  CHEST - 1 VIEW  COMPARISON: DG CHEST 1V PORT dated 12/16/2012; CT CHEST W/O CM dated  06/13/2007; CT CHEST W/CM dated 12/01/2006; DG CHEST 2 VIEW dated  12/08/2004  FINDINGS:  Focal opacity at the right lung base, increased since the prior  examinations. The prior CT showed new interstitial fibrosis in the  right  lower lobe. Mild hyperinflation and mild emphysematous changes  in the upper lobes, unchanged. Lungs otherwise clear. No pleural  effusions. No pneumothorax. Normal pulmonary vascularity. Cardiac  silhouette mildly enlarged but stable.  IMPRESSION:  1. Increased opacity at the right lung base compared to prior  examinations, with baseline interstitial fibrosis in the right lower  lobe. This could represent worsening fibrosis or a superimposed  bronchopneumonia. Are there clinical findings to suggest pneumonia?  2. Stable COPD/emphysema. No acute cardiopulmonary disease  otherwise.  3. Stable mild cardiomegaly without pulmonary edema.    ASSESSMENT/PLAN:  Right hip fracture S/P repair - for  rehabilitation  Atrial fibrillation - rate controlled  Hypertension - well-controlled  CAP - continue Levaquin  Hypothyroidism - well-controlled   CPT CODE: 16109  Ella Bodo - NP [facility] [phone]

## 2013-05-01 LAB — CULTURE, BLOOD (ROUTINE X 2)
CULTURE: NO GROWTH
Culture: NO GROWTH

## 2013-05-08 ENCOUNTER — Encounter: Payer: Self-pay | Admitting: *Deleted

## 2013-05-08 ENCOUNTER — Non-Acute Institutional Stay (SKILLED_NURSING_FACILITY): Payer: Medicare Other | Admitting: Internal Medicine

## 2013-05-08 ENCOUNTER — Encounter: Payer: Self-pay | Admitting: Internal Medicine

## 2013-05-08 DIAGNOSIS — S72009A Fracture of unspecified part of neck of unspecified femur, initial encounter for closed fracture: Secondary | ICD-10-CM

## 2013-05-08 DIAGNOSIS — N183 Chronic kidney disease, stage 3 unspecified: Secondary | ICD-10-CM

## 2013-05-08 DIAGNOSIS — D72829 Elevated white blood cell count, unspecified: Secondary | ICD-10-CM

## 2013-05-08 DIAGNOSIS — E039 Hypothyroidism, unspecified: Secondary | ICD-10-CM

## 2013-05-08 DIAGNOSIS — N1831 Chronic kidney disease, stage 3a: Secondary | ICD-10-CM

## 2013-05-08 DIAGNOSIS — D62 Acute posthemorrhagic anemia: Secondary | ICD-10-CM

## 2013-05-08 DIAGNOSIS — T18128A Food in esophagus causing other injury, initial encounter: Secondary | ICD-10-CM

## 2013-05-08 DIAGNOSIS — I10 Essential (primary) hypertension: Secondary | ICD-10-CM

## 2013-05-08 DIAGNOSIS — T18108A Unspecified foreign body in esophagus causing other injury, initial encounter: Secondary | ICD-10-CM

## 2013-05-08 DIAGNOSIS — R1310 Dysphagia, unspecified: Secondary | ICD-10-CM

## 2013-05-08 DIAGNOSIS — I5032 Chronic diastolic (congestive) heart failure: Secondary | ICD-10-CM

## 2013-05-08 DIAGNOSIS — Z7901 Long term (current) use of anticoagulants: Secondary | ICD-10-CM

## 2013-05-08 DIAGNOSIS — I4891 Unspecified atrial fibrillation: Secondary | ICD-10-CM

## 2013-05-08 DIAGNOSIS — J189 Pneumonia, unspecified organism: Secondary | ICD-10-CM

## 2013-05-08 DIAGNOSIS — S72001A Fracture of unspecified part of neck of right femur, initial encounter for closed fracture: Secondary | ICD-10-CM

## 2013-05-08 NOTE — Assessment & Plan Note (Signed)
Continue lasix and tenormin

## 2013-05-08 NOTE — Assessment & Plan Note (Signed)
Compensated-lasix and tenormin

## 2013-05-08 NOTE — Assessment & Plan Note (Signed)
Felt to be secondary to PNA-? Infiltrates on CXR- pt treated prophylacticaly with levaquin QOD FOR TOTAL 7 DAYS; 5 more days at d/c /c

## 2013-05-08 NOTE — Assessment & Plan Note (Signed)
Continue levothyroxine 

## 2013-05-08 NOTE — Assessment & Plan Note (Signed)
Rate controlled with flecainide and atenolol; prophylaxed with warfarin

## 2013-05-08 NOTE — Assessment & Plan Note (Signed)
Pre-surg Hb 15.7-post surg Hb 10.3; will need to recheck

## 2013-05-08 NOTE — Assessment & Plan Note (Signed)
S/p repair 04/26/2013; admitted to SNF for OT/PT

## 2013-05-08 NOTE — Assessment & Plan Note (Addendum)
BUN 34-36/Cr 1.75 to 2.15, probably baseline for pt but will monitor during rehab to make certain pt doesn't get too dry with lasix and stays hydrated

## 2013-05-08 NOTE — Progress Notes (Signed)
MRN: [id number] Name: Rosine BeatRalph B Janeway  Sex: male Age: 80 y.o. DOB: 06/20/1918  [facility] Facility/Room: 403 Level Of Care: SNF Provider: Merrilee SeashoreALEXANDER, ANNE D Emergency Contacts: Extended Emergency Contact Information Primary Emergency Contact: East,Katrine Address: [address]          [place] [id number] [place] Home Phone: [phone] Relation: Daughter Secondary Emergency Contact: Selner,Timothy Address: [address]          [place], [place] [id number] [place] Mobile Phone: [phone] Relation: Son   Allergies: Diltiazem hcl er and Metoprolol succinate er  Chief Complaint  Patient presents with  . nursing home admission    HPI: Patient is 80 y.o. male who fell and broke his hip,s/p repair , admitted to SNF for OT/PT.  Past Medical History  Diagnosis Date  . GERD (gastroesophageal reflux disease)   . Hypertension   . COPD (chronic obstructive pulmonary disease)   . Chronic kidney disease   . Ventricular bigeminy   . PVC (premature ventricular contraction)   . Dyspnea   . Thyroid disease   . CHF (congestive heart failure)     Past Surgical History  Procedure Laterality Date  . Cholecystectomy    . Splenectomy, partial    . Esophagogastroduodenoscopy N/A 04/26/2013    Procedure: ESOPHAGOGASTRODUODENOSCOPY (EGD);  Surgeon: Florencia Reasonsobert V Buccini, MD;  [facility];  Service: Endoscopy;  Laterality: N/A;  . Esophagogastroduodenoscopy (egd) with esophageal dilation N/A 04/26/2013    Procedure: ESOPHAGOGASTRODUODENOSCOPY (EGD) WITH ESOPHAGEAL DILATION;  Surgeon: Florencia Reasonsobert V Buccini, MD;  Location: [facility];  Service: Endoscopy;  Laterality: N/A;  . Hip arthroplasty Right 04/25/2013    Procedure: ARTHROPLASTY BIPOLAR HIP;  Surgeon: Nestor LewandowskyFrank J Rowan, MD;  Location: [facility] OR;  Service: Orthopedics;  Laterality: Right;      Medication List       This list is accurate as of: 05/08/13 11:59 PM.  Always use your  most recent med list.               albuterol 108 (90 BASE) MCG/ACT inhaler  Commonly known as:  PROVENTIL HFA;VENTOLIN HFA  Inhale 2 puffs into the lungs every 6 (six) hours as needed for wheezing.     atenolol 25 MG tablet  Commonly known as:  TENORMIN  Take 25 mg by mouth daily. For HTN     flecainide 100 MG tablet  Commonly known as:  TAMBOCOR  Take 50 mg by mouth 2 (two) times daily. For  A-Fib     furosemide 20 MG tablet  Commonly known as:  LASIX  Take 40 mg by mouth daily as needed. For fluid retention/edema     HYDROcodone-acetaminophen 5-325 MG per tablet  Commonly known as:  NORCO  Take 1 tablet by mouth every 6 (six) hours as needed for moderate pain.     latanoprost 0.005 % ophthalmic solution  Commonly known as:  XALATAN  Place 1 drop into both eyes at bedtime. For glaucoma     levofloxacin 500 MG tablet  Commonly known as:  LEVAQUIN  Take 1 tablet (500 mg total) by mouth every other day. For 5 more days     levothyroxine 100 MCG tablet  Commonly known as:  SYNTHROID, LEVOTHROID  Take 50 mcg by mouth daily before breakfast. For hypothyroidism     omeprazole 20 MG capsule  Commonly known as:  PRILOSEC  Take 20 mg by mouth daily. For GERD  tizanidine 2 MG capsule  Commonly known as:  ZANAFLEX  Take 2 mg by mouth 3 (three) times daily. For muscle spams     warfarin 2.5 MG tablet  Commonly known as:  COUMADIN  Take by mouth daily. Take 1 tablet on all days except 1.5 tablets on Monday, Wednesday, and Friday; or as directed by Coumadin Clinic.        No orders of the defined types were placed in this encounter.    Immunization History  Administered Date(s) Administered  . Influenza,inj,Quad PF,36+ Mos 12/18/2012  . PPD Test 04/28/2013    History  Substance Use Topics  . Smoking status: Never Smoker   . Smokeless tobacco: Not on file  . Alcohol Use: No    Family history is noncontributory    Review of Systems  DATA OBTAINED: from  patient GENERAL: Feels well no fevers, fatigue, appetite changes SKIN: No itching, rash or wounds EYES: No eye pain, redness, discharge EARS: No earache, tinnitus, change in hearing NOSE: No congestion, drainage or bleeding  MOUTH/THROAT: No mouth or tooth pain, No sore throat RESPIRATORY: No cough, wheezing, SOB CARDIAC: No chest pain, palpitations, lower extremity edema  GI: No abdominal pain, No N/V/D or constipation, No heartburn or reflux  GU: No dysuria, frequency or urgency, or incontinence  MUSCULOSKELETAL: No unrelieved bone/joint pain NEUROLOGIC: No headache, dizziness or focal weakness PSYCHIATRIC: No overt anxiety or sadness. Sleeps well. No behavior issue.   Filed Vitals:   05/09/13 1454  BP: 130/65  Pulse: 58  Temp: 97 F (36.1 C)  Resp: 18    Physical Exam  GENERAL APPEARANCE: Alert, conversant. Appropriately groomed. No acute distress.  SKIN: No diaphoresis rash HEAD: Normocephalic, atraumatic  EYES: Conjunctiva/lids clear. Pupils round, reactive. EOMs intact.  EARS: External exam WNL, canals clear. Hearing grossly normal.  NOSE: No deformity or discharge.  MOUTH/THROAT: Lips w/o lesions. Mouth and throat normal  RESPIRATORY: Breathing is even, unlabored. Lung sounds are clear   CARDIOVASCULAR: Heart RRR no murmurs, rubs or gallops. No peripheral edema.   GASTROINTESTINAL: Abdomen is soft, non-tender, not distended w/ normal bowel sounds.  GENITOURINARY: Bladder non tender, not distended  MUSCULOSKELETAL: No abnormal joints or musculature NEUROLOGIC: Oriented X3. Cranial nerves 2-12 grossly intact. Moves all extremities no tremor. PSYCHIATRIC: Mood and affect appropriate to situation, no behavioral issues  Patient Active Problem List   Diagnosis Date Noted  . Acute blood loss anemia 05/08/2013  . Food impaction of esophagus 04/26/2013  . Closed right hip fracture 04/24/2013  . Chronic kidney disease (CKD) stage G3a/A1, moderately decreased glomerular  filtration rate (GFR) between 45-59 mL/min/1.73 square meter and albuminuria creatinine ratio less than 30 mg/g 04/24/2013  . CAP (community acquired pneumonia) 04/24/2013  . Leukocytosis 04/24/2013  . Encounter for therapeutic drug monitoring 04/18/2013  . COPD (chronic obstructive pulmonary disease) 01/01/2013  . Chronic anticoagulation 01/01/2013  . Chronic diastolic heart failure 01/01/2013  . Diarrhea 01/01/2013  . Atrial fibrillation 12/18/2012  . Bradycardia 12/16/2012  . Hypertension 12/16/2012  . Hypothyroidism 12/16/2012  . Acute kidney failure 12/16/2012    CBC    Component Value Date/Time   WBC 13.9* 04/28/2013 0920   RBC 2.99* 04/28/2013 0920   HGB 9.9* 04/28/2013 0920   HCT 30.4* 04/28/2013 0920   PLT 190 04/28/2013 0920   MCV 101.7* 04/28/2013 0920    CMP     Component Value Date/Time   NA 137 04/27/2013 0540   K 5.3 04/27/2013 0540   CL 105  04/27/2013 0540   CO2 21 04/27/2013 0540   GLUCOSE 113* 04/27/2013 0540   BUN 36* 04/27/2013 0540   CREATININE 2.15* 04/27/2013 0540   CALCIUM 7.9* 04/27/2013 0540   PROT 5.9* 12/01/2006 0510   ALBUMIN 3.2* 12/01/2006 0510   AST 17 12/01/2006 0510   ALT 12 12/01/2006 0510   ALKPHOS 79 12/01/2006 0510   BILITOT 1.1 12/01/2006 0510   GFRNONAA 25* 04/27/2013 0540   GFRAA 29* 04/27/2013 0540    Assessment and Plan  Closed right hip fracture S/p repair 04/26/2013; admitted to SNF for OT/PT  Food impaction of esophagus Discovered during hospital admit;2/2 esophageal stricture;EGD 2/14 with removal of food and dilitation;mechsnical soft diet  Atrial fibrillation Rate controlled with flecainide and atenolol; prophylaxed with warfarin  Hypertension Continue lasix and tenormin  Chronic diastolic heart failure Compensated-lasix and tenormin  Hypothyroidism Continue levothyroxine  Chronic kidney disease (CKD) stage G3a/A1, moderately decreased glomerular filtration rate (GFR) between 45-59 mL/min/1.73 square meter and albuminuria  creatinine ratio less than 30 mg/g BUN 34-36/Cr 1.75 to 2.15, probably baseline for pt but will monitor during rehab to make certain pt doesn't get too dry with lasix and stays hydrated  Chronic anticoagulation Coumadin for afib;monitor PT/INR  Leukocytosis Felt to be secondary to PNA-? Infiltrates on CXR- pt treated prophylacticaly with levaquin QOD FOR TOTAL 7 DAYS; 5 more days at d/c /c  CAP (community acquired pneumonia) Felt to be a possible cause of elevated WBC;tx with levaquin 500mg  QOD, renal dose for 7 days (4 doses total)  Acute blood loss anemia Pre-surg Hb 15.7-post surg Hb 10.3; will need to recheck    Margit Hanks, MD

## 2013-05-08 NOTE — Assessment & Plan Note (Signed)
Discovered during hospital admit;2/2 esophageal stricture;EGD 2/14 with removal of food and dilitation;mechsnical soft diet

## 2013-05-08 NOTE — Assessment & Plan Note (Signed)
Coumadin for afib;monitor PT/INR

## 2013-05-08 NOTE — Assessment & Plan Note (Signed)
Felt to be a possible cause of elevated WBC;tx with levaquin 500mg  QOD, renal dose for 7 days (4 doses total)

## 2013-05-09 ENCOUNTER — Encounter: Payer: Self-pay | Admitting: Internal Medicine

## 2013-05-09 DIAGNOSIS — R1310 Dysphagia, unspecified: Secondary | ICD-10-CM | POA: Insufficient documentation

## 2013-05-09 NOTE — Progress Notes (Signed)
This encounter was created in error - please disregard.

## 2013-05-09 NOTE — Assessment & Plan Note (Signed)
Leading to esophageal impaction and possibly to aspiration PNA; dysphagia 1 diet

## 2013-05-12 ENCOUNTER — Telehealth: Payer: Self-pay | Admitting: Cardiology

## 2013-05-12 NOTE — Telephone Encounter (Signed)
[facility], but that she needs to call us back once he has been discharged so we can set him back up in our coumadin clinic at that time.  We will cancel this week's appointment, and reschedule a new one after she calls us back (following his discharge).

## 2013-05-12 NOTE — Telephone Encounter (Signed)
New message    For Jordan RuskJeremy Pt fell and broke him 04-24-13.  He is now at [facility]---scheduled to have PT/INR checked here Friday.  Can we order [facility] to check it and send it to you?

## 2013-05-13 ENCOUNTER — Ambulatory Visit: Payer: Medicare Other | Admitting: Cardiology

## 2013-05-21 ENCOUNTER — Non-Acute Institutional Stay (SKILLED_NURSING_FACILITY): Payer: Medicare Other | Admitting: Adult Health

## 2013-05-21 DIAGNOSIS — N1831 Chronic kidney disease, stage 3a: Secondary | ICD-10-CM

## 2013-05-21 DIAGNOSIS — J189 Pneumonia, unspecified organism: Secondary | ICD-10-CM

## 2013-05-21 DIAGNOSIS — S72009A Fracture of unspecified part of neck of unspecified femur, initial encounter for closed fracture: Secondary | ICD-10-CM

## 2013-05-21 DIAGNOSIS — E039 Hypothyroidism, unspecified: Secondary | ICD-10-CM

## 2013-05-21 DIAGNOSIS — N183 Chronic kidney disease, stage 3 unspecified: Secondary | ICD-10-CM

## 2013-05-21 DIAGNOSIS — I5032 Chronic diastolic (congestive) heart failure: Secondary | ICD-10-CM

## 2013-05-21 DIAGNOSIS — S72001A Fracture of unspecified part of neck of right femur, initial encounter for closed fracture: Secondary | ICD-10-CM

## 2013-05-21 DIAGNOSIS — I10 Essential (primary) hypertension: Secondary | ICD-10-CM

## 2013-05-21 DIAGNOSIS — I4891 Unspecified atrial fibrillation: Secondary | ICD-10-CM

## 2013-05-23 ENCOUNTER — Encounter: Payer: Self-pay | Admitting: Adult Health

## 2013-05-23 DIAGNOSIS — J189 Pneumonia, unspecified organism: Secondary | ICD-10-CM | POA: Insufficient documentation

## 2013-05-23 NOTE — Progress Notes (Signed)
Patient ID: Jordan [name], male   DOB: 1918-09-02, 80 y.o.   MRN: [id number]               PROGRESS NOTE  DATE: 05/21/13  [facility]  LEVEL OF CARE: SNF (31)  Acute Visit  CHIEF COMPLAINT:  Discharge Notes  HISTORY OF PRESENT ILLNESS: This is a 80 year old male who is for discharge home with Home health PT, OT and Nursing. DME: Rolling walker. [facility] hip fracture S/P repair. Patient was admitted to this facility for short-term rehabilitation after the patient's recent hospitalization.  Patient has completed SNF rehabilitation and therapy has cleared the patient for discharge.  REASSESSMENT OF ONGOING PROBLEM(S):  ATRIAL FIBRILLATION: the patients atrial fibrillation remains stable.  The patient denies DOE, tachycardia, orthopnea, transient neurological sx, pedal edema, palpitations, & PNDs.  No complications noted from the medications currently being used.  CHF:The patient does not relate significant weight changes, denies sob, DOE, orthopnea, PNDs, pedal edema, palpitations or chest pain.  CHF remains stable.  No complications form the medications being used.  HTN: Pt 's HTN remains stable.  Denies CP, sob, DOE, pedal edema, headaches, dizziness or visual disturbances.  No complications from the medications currently being used.  Last BP : 115/58   PAST MEDICAL HISTORY : Reviewed.  No changes.  [facility]  REVIEW OF SYSTEMS:  GENERAL: no change in appetite, no fatigue, no weight changes, no fever, chills or weakness RESPIRATORY: no cough, SOB, DOE, wheezing, hemoptysis CARDIAC: no chest pain, edema or palpitations GI: no abdominal pain, diarrhea, constipation, heart burn, nausea or vomiting  PHYSICAL EXAMINATION  GENERAL: no acute distress NECK: supple, trachea midline, no neck masses, no thyroid tenderness, no  thyromegaly LYMPHATICS: no LAN in the neck, no supraclavicular LAN RESPIRATORY: breathing is even & unlabored, BS CTAB CARDIAC: RRR, no murmur,no extra heart sounds, no edema GI: abdomen soft, normal BS, no masses, no tenderness, no hepatomegaly, no splenomegaly PSYCHIATRIC: the patient is alert & oriented to person, affect & behavior appropriate  LABS/RADIOLOGY: Labs reviewed: 05/21/13 wbc 10.3  hgb 11.0  hct 35.2  NA144 potassium 4.3 glucose 115 BUN 26 creatinine 1.9 calcium 8.5; chest x-ray shows patchy right basilar atelectasis or early pneumonia  05/09/13 sodium 139 potassium 4.2 glucose 134 BUN 25 creatinine 1.7 calcium 8.6 Basic Metabolic Panel:  Recent Labs  40/98/1108/08/24 1707 12/17/12 0232  04/24/13 1437 04/26/13 0511 04/27/13 0540  NA 144  --   < > 141 139 137  K 4.5  --   < > 5.3 5.2 5.3  CL 113*  --   < > 105 106 105  CO2  --   --   < > 22 23 21   GLUCOSE 114*  --   < > 101* 119* 113*  BUN 61*  --   < > 35* 34* 36*  CREATININE 2.00* 1.67*  < > 1.75* 1.94* 2.15*  CALCIUM  --   --   < > 8.7 8.1* 7.9*  MG  --  1.5  --   --   --   --   < > = values in this interval not displayed.  CBC:  Recent Labs  04/26/13 0511 04/27/13 0540 04/28/13 0920  WBC 13.5* 15.7* 13.9*  HGB 11.5* 10.3* 9.9*  HCT 35.1* 30.7* 30.4*  MCV 100.9* 100.3* 101.7*  PLT 201 182 190  Recent Labs  04/28/13 1112  GLUCAP 112*   EXAM:  CHEST - 1 VIEW  COMPARISON: DG CHEST 1V PORT dated 12/16/2012; CT CHEST W/O CM dated  06/13/2007; CT CHEST W/CM dated 12/01/2006; DG CHEST 2 VIEW dated  12/08/2004  FINDINGS:  Focal opacity at the right lung base, increased since the prior  examinations. The prior CT showed new interstitial fibrosis in the  right lower lobe. Mild hyperinflation and mild emphysematous changes  in the upper lobes, unchanged. Lungs otherwise clear. No pleural  effusions. No pneumothorax. Normal pulmonary vascularity. Cardiac  silhouette mildly enlarged but stable.  IMPRESSION:  1.  Increased opacity at the right lung base compared to prior  examinations, with baseline interstitial fibrosis in the right lower  lobe. This could represent worsening fibrosis or a superimposed  bronchopneumonia. Are there clinical findings to suggest pneumonia?  2. Stable COPD/emphysema. No acute cardiopulmonary disease  otherwise.  3. Stable mild cardiomegaly without pulmonary edema.    ASSESSMENT/PLAN:  Right hip fracture S/P repair - for home health PT, OT and nursing  Atrial fibrillation - rate controlled  Hypertension - well-controlled  CAP - continue doxycycline  Hypothyroidism - well-controlled  Pneumonia - start doxycycline 100 mg by mouth twice a day x10 days  CKD, stage III - discontinue Lasix 20 mg PO Q D; BMP in 1 week   I have filled out patient's discharge paperwork and written prescriptions.  Patient will receive home health PT, OT and Nursing.  DME provided: Rolling walker  Total discharge time: Greater than 30 minutes Discharge time involved coordination of the discharge process with Child psychotherapist, nursing staff and therapy department. Medical justification for home health services/DME verified.   CPT CODE: 96045  Ella Bodo - NP [facility] [phone]

## 2013-05-26 ENCOUNTER — Telehealth: Payer: Self-pay | Admitting: Cardiology

## 2013-05-26 NOTE — Telephone Encounter (Signed)
New message    Home health calling was in the home today wanted Dr. [name] to be aware .    C/O wheezing today , edema in both lower extremities  +3 in left . 2 + in right.     Patient was instructed to take lasix which he did. Home health was in the home for over an hours. Wheezing has stop.

## 2013-05-26 NOTE — Telephone Encounter (Signed)
New problem   Need to know when does pt need to make another appt. Please call.

## 2013-05-27 NOTE — Telephone Encounter (Signed)
Spoke with pt's daughter was told to call when pt was discharged from rehab facility.  Pt was discharged on Friday 05/23/13, INR was WNL per pt's daughter.  Pt was discharged on previous home dosage of Coumadin 1 tablet daily except 1.5 tablets on MWF.  Made f/u on 06/06/13 in Coumadin Clinic pt's daughter aware.

## 2013-05-28 NOTE — Telephone Encounter (Signed)
For your Information.Marland Kitchen.Marland Kitchen..Marland Kitchen

## 2013-05-28 NOTE — Telephone Encounter (Signed)
Thanks

## 2013-06-02 ENCOUNTER — Telehealth: Payer: Self-pay | Admitting: *Deleted

## 2013-06-02 NOTE — Telephone Encounter (Signed)
Jordan BeechamCynthia called, reported not being able to draw enough blood for BMP on 3/20. Pt was appt with Coumadin clinic on 3/27. Will have patient draw labs immediately after

## 2013-06-06 ENCOUNTER — Ambulatory Visit (INDEPENDENT_AMBULATORY_CARE_PROVIDER_SITE_OTHER): Payer: Medicare Other | Admitting: Pharmacist

## 2013-06-06 DIAGNOSIS — Z5181 Encounter for therapeutic drug level monitoring: Secondary | ICD-10-CM

## 2013-06-06 DIAGNOSIS — I4891 Unspecified atrial fibrillation: Secondary | ICD-10-CM

## 2013-06-06 LAB — BASIC METABOLIC PANEL
BUN: 32 mg/dL — AB (ref 6–23)
CO2: 22 mEq/L (ref 19–32)
Calcium: 8.7 mg/dL (ref 8.4–10.5)
Chloride: 112 mEq/L (ref 96–112)
Creatinine, Ser: 1.9 mg/dL — ABNORMAL HIGH (ref 0.4–1.5)
GFR: 36.44 mL/min — AB (ref >60.00)
Glucose, Bld: 103 mg/dL — ABNORMAL HIGH (ref 70–99)
Potassium: 4.1 mEq/L (ref 3.5–5.1)
SODIUM: 141 meq/L (ref 135–145)

## 2013-06-06 LAB — POCT INR: INR: 4.5

## 2013-06-13 ENCOUNTER — Ambulatory Visit (INDEPENDENT_AMBULATORY_CARE_PROVIDER_SITE_OTHER): Payer: Medicare Other | Admitting: Pharmacist

## 2013-06-13 DIAGNOSIS — I4891 Unspecified atrial fibrillation: Secondary | ICD-10-CM

## 2013-06-13 DIAGNOSIS — Z5181 Encounter for therapeutic drug level monitoring: Secondary | ICD-10-CM

## 2013-06-13 LAB — POCT INR: INR: 3.3

## 2013-06-20 ENCOUNTER — Ambulatory Visit (INDEPENDENT_AMBULATORY_CARE_PROVIDER_SITE_OTHER): Payer: Medicare Other

## 2013-06-20 DIAGNOSIS — I4891 Unspecified atrial fibrillation: Secondary | ICD-10-CM

## 2013-06-20 DIAGNOSIS — Z5181 Encounter for therapeutic drug level monitoring: Secondary | ICD-10-CM

## 2013-06-20 LAB — POCT INR: INR: 2.4

## 2013-07-04 ENCOUNTER — Ambulatory Visit (INDEPENDENT_AMBULATORY_CARE_PROVIDER_SITE_OTHER): Payer: Medicare Other | Admitting: Pharmacist

## 2013-07-04 DIAGNOSIS — Z5181 Encounter for therapeutic drug level monitoring: Secondary | ICD-10-CM

## 2013-07-04 DIAGNOSIS — I4891 Unspecified atrial fibrillation: Secondary | ICD-10-CM

## 2013-07-04 LAB — POCT INR: INR: 1.4

## 2013-07-07 ENCOUNTER — Other Ambulatory Visit: Payer: Self-pay | Admitting: Cardiology

## 2013-07-09 ENCOUNTER — Ambulatory Visit: Payer: Medicare Other | Admitting: Cardiology

## 2013-07-10 ENCOUNTER — Encounter (HOSPITAL_COMMUNITY): Payer: Self-pay | Admitting: Emergency Medicine

## 2013-07-10 ENCOUNTER — Observation Stay (HOSPITAL_COMMUNITY)
Admission: EM | Admit: 2013-07-10 | Discharge: 2013-07-11 | Disposition: A | Discharge status: Home / Self Care | Payer: Medicare Other | Attending: Internal Medicine | Admitting: Internal Medicine

## 2013-07-10 ENCOUNTER — Emergency Department (HOSPITAL_COMMUNITY): Payer: Medicare Other

## 2013-07-10 DIAGNOSIS — I5032 Chronic diastolic (congestive) heart failure: Secondary | ICD-10-CM

## 2013-07-10 DIAGNOSIS — N1831 Chronic kidney disease, stage 3a: Secondary | ICD-10-CM

## 2013-07-10 DIAGNOSIS — E039 Hypothyroidism, unspecified: Secondary | ICD-10-CM | POA: Insufficient documentation

## 2013-07-10 DIAGNOSIS — J4489 Other specified chronic obstructive pulmonary disease: Secondary | ICD-10-CM | POA: Insufficient documentation

## 2013-07-10 DIAGNOSIS — R197 Diarrhea, unspecified: Secondary | ICD-10-CM

## 2013-07-10 DIAGNOSIS — J441 Chronic obstructive pulmonary disease with (acute) exacerbation: Secondary | ICD-10-CM | POA: Diagnosis present

## 2013-07-10 DIAGNOSIS — R55 Syncope and collapse: Principal | ICD-10-CM | POA: Insufficient documentation

## 2013-07-10 DIAGNOSIS — I10 Essential (primary) hypertension: Secondary | ICD-10-CM

## 2013-07-10 DIAGNOSIS — Z5181 Encounter for therapeutic drug level monitoring: Secondary | ICD-10-CM

## 2013-07-10 DIAGNOSIS — J449 Chronic obstructive pulmonary disease, unspecified: Secondary | ICD-10-CM

## 2013-07-10 DIAGNOSIS — S72001A Fracture of unspecified part of neck of right femur, initial encounter for closed fracture: Secondary | ICD-10-CM

## 2013-07-10 DIAGNOSIS — D72829 Elevated white blood cell count, unspecified: Secondary | ICD-10-CM

## 2013-07-10 DIAGNOSIS — R1310 Dysphagia, unspecified: Secondary | ICD-10-CM

## 2013-07-10 DIAGNOSIS — I498 Other specified cardiac arrhythmias: Secondary | ICD-10-CM | POA: Insufficient documentation

## 2013-07-10 DIAGNOSIS — J189 Pneumonia, unspecified organism: Secondary | ICD-10-CM

## 2013-07-10 DIAGNOSIS — N183 Chronic kidney disease, stage 3 unspecified: Secondary | ICD-10-CM | POA: Insufficient documentation

## 2013-07-10 DIAGNOSIS — T18128A Food in esophagus causing other injury, initial encounter: Secondary | ICD-10-CM

## 2013-07-10 DIAGNOSIS — R001 Bradycardia, unspecified: Secondary | ICD-10-CM | POA: Diagnosis present

## 2013-07-10 DIAGNOSIS — D62 Acute posthemorrhagic anemia: Secondary | ICD-10-CM

## 2013-07-10 DIAGNOSIS — Z7901 Long term (current) use of anticoagulants: Secondary | ICD-10-CM | POA: Insufficient documentation

## 2013-07-10 DIAGNOSIS — I4891 Unspecified atrial fibrillation: Secondary | ICD-10-CM | POA: Insufficient documentation

## 2013-07-10 DIAGNOSIS — N179 Acute kidney failure, unspecified: Secondary | ICD-10-CM

## 2013-07-10 DIAGNOSIS — I4949 Other premature depolarization: Secondary | ICD-10-CM | POA: Insufficient documentation

## 2013-07-10 DIAGNOSIS — K219 Gastro-esophageal reflux disease without esophagitis: Secondary | ICD-10-CM | POA: Insufficient documentation

## 2013-07-10 DIAGNOSIS — I129 Hypertensive chronic kidney disease with stage 1 through stage 4 chronic kidney disease, or unspecified chronic kidney disease: Secondary | ICD-10-CM | POA: Insufficient documentation

## 2013-07-10 LAB — CBC WITH DIFFERENTIAL/PLATELET
BASOS ABS: 0 10*3/uL (ref 0.0–0.1)
BASOS PCT: 0 % (ref 0–1)
EOS PCT: 3 % (ref 0–5)
Eosinophils Absolute: 0.4 10*3/uL (ref 0.0–0.7)
HCT: 37.7 % — ABNORMAL LOW (ref 39.0–52.0)
Hemoglobin: 12.3 g/dL — ABNORMAL LOW (ref 13.0–17.0)
LYMPHS PCT: 18 % (ref 12–46)
Lymphs Abs: 2.5 10*3/uL (ref 0.7–4.0)
MCH: 32.3 pg (ref 26.0–34.0)
MCHC: 32.6 g/dL (ref 30.0–36.0)
MCV: 99 fL (ref 78.0–100.0)
Monocytes Absolute: 1.2 10*3/uL — ABNORMAL HIGH (ref 0.1–1.0)
Monocytes Relative: 8 % (ref 3–12)
Neutro Abs: 9.7 10*3/uL — ABNORMAL HIGH (ref 1.7–7.7)
Neutrophils Relative %: 71 % (ref 43–77)
Platelets: 253 10*3/uL (ref 150–400)
RBC: 3.81 MIL/uL — ABNORMAL LOW (ref 4.22–5.81)
RDW: 14.3 % (ref 11.5–15.5)
WBC: 13.8 10*3/uL — ABNORMAL HIGH (ref 4.0–10.5)

## 2013-07-10 LAB — COMPREHENSIVE METABOLIC PANEL
ALT: 9 U/L (ref 0–53)
AST: 16 U/L (ref 0–37)
Albumin: 3.2 g/dL — ABNORMAL LOW (ref 3.5–5.2)
Alkaline Phosphatase: 135 U/L — ABNORMAL HIGH (ref 39–117)
BUN: 30 mg/dL — ABNORMAL HIGH (ref 6–23)
CALCIUM: 8.5 mg/dL (ref 8.4–10.5)
CO2: 24 mEq/L (ref 19–32)
CREATININE: 1.69 mg/dL — AB (ref 0.50–1.35)
Chloride: 105 mEq/L (ref 96–112)
GFR, EST AFRICAN AMERICAN: 38 mL/min — AB (ref >90)
GFR, EST NON AFRICAN AMERICAN: 33 mL/min — AB (ref >90)
Glucose, Bld: 110 mg/dL — ABNORMAL HIGH (ref 70–99)
Potassium: 4.9 mEq/L (ref 3.7–5.3)
Sodium: 140 mEq/L (ref 137–147)
TOTAL PROTEIN: 6.6 g/dL (ref 6.0–8.3)
Total Bilirubin: 0.3 mg/dL (ref 0.3–1.2)

## 2013-07-10 LAB — URINALYSIS, ROUTINE W REFLEX MICROSCOPIC
Bilirubin Urine: NEGATIVE
Glucose, UA: NEGATIVE mg/dL
Hgb urine dipstick: NEGATIVE
Ketones, ur: NEGATIVE mg/dL
Leukocytes, UA: NEGATIVE
NITRITE: NEGATIVE
PH: 5.5 (ref 5.0–8.0)
Protein, ur: NEGATIVE mg/dL
SPECIFIC GRAVITY, URINE: 1.021 (ref 1.005–1.030)
UROBILINOGEN UA: 0.2 mg/dL (ref 0.0–1.0)

## 2013-07-10 LAB — PROTIME-INR
INR: 2.22 — ABNORMAL HIGH (ref 0.00–1.49)
Prothrombin Time: 23.9 seconds — ABNORMAL HIGH (ref 11.6–15.2)

## 2013-07-10 LAB — APTT: aPTT: 44 seconds — ABNORMAL HIGH (ref 24–37)

## 2013-07-10 MED ORDER — WARFARIN SODIUM 2.5 MG PO TABS
2.5000 mg | ORAL_TABLET | ORAL | Status: DC
  Administered 2013-07-10: 2.5 mg via ORAL
  Filled 2013-07-10: qty 1

## 2013-07-10 MED ORDER — WARFARIN - PHYSICIAN DOSING INPATIENT: Freq: Every day | Status: DC

## 2013-07-10 MED ORDER — SODIUM CHLORIDE 0.9 % IJ SOLN
3.0000 mL | Freq: Two times a day (BID) | INTRAMUSCULAR | Status: DC
  Administered 2013-07-10 – 2013-07-11 (×2): 3 mL via INTRAVENOUS

## 2013-07-10 MED ORDER — SENNOSIDES-DOCUSATE SODIUM 8.6-50 MG PO TABS
1.0000 | ORAL_TABLET | Freq: Every evening | ORAL | Status: DC | PRN
  Filled 2013-07-10: qty 1

## 2013-07-10 MED ORDER — FLECAINIDE ACETATE 50 MG PO TABS
50.0000 mg | ORAL_TABLET | Freq: Two times a day (BID) | ORAL | Status: DC
  Administered 2013-07-10 – 2013-07-11 (×2): 50 mg via ORAL
  Filled 2013-07-10 (×3): qty 1

## 2013-07-10 MED ORDER — ALBUTEROL SULFATE (2.5 MG/3ML) 0.083% IN NEBU
5.0000 mg | INHALATION_SOLUTION | Freq: Once | RESPIRATORY_TRACT | Status: AC
  Administered 2013-07-10: 5 mg via RESPIRATORY_TRACT
  Filled 2013-07-10: qty 6

## 2013-07-10 MED ORDER — PANTOPRAZOLE SODIUM 40 MG PO TBEC
40.0000 mg | DELAYED_RELEASE_TABLET | Freq: Every day | ORAL | Status: DC
  Administered 2013-07-11: 40 mg via ORAL
  Filled 2013-07-10: qty 1

## 2013-07-10 MED ORDER — WARFARIN SODIUM 2.5 MG PO TABS: 2.5000 mg | ORAL_TABLET | Freq: Every day | ORAL | Status: DC

## 2013-07-10 MED ORDER — WARFARIN SODIUM 3 MG PO TABS
3.0000 mg | ORAL_TABLET | ORAL | Status: DC
  Filled 2013-07-10: qty 1

## 2013-07-10 MED ORDER — LATANOPROST 0.005 % OP SOLN
1.0000 [drp] | Freq: Every day | OPHTHALMIC | Status: DC
  Administered 2013-07-10: 1 [drp] via OPHTHALMIC
  Filled 2013-07-10: qty 2.5

## 2013-07-10 MED ORDER — LEVOTHYROXINE SODIUM 100 MCG PO TABS
100.0000 ug | ORAL_TABLET | Freq: Every day | ORAL | Status: DC
  Administered 2013-07-11: 100 ug via ORAL
  Filled 2013-07-10 (×2): qty 1

## 2013-07-10 MED ORDER — ENOXAPARIN SODIUM 40 MG/0.4ML ~~LOC~~ SOLN: 40.0000 mg | SUBCUTANEOUS | Status: DC

## 2013-07-10 MED ORDER — ACETAMINOPHEN 325 MG PO TABS
650.0000 mg | ORAL_TABLET | Freq: Four times a day (QID) | ORAL | Status: DC | PRN
Start: 2013-07-10 — End: 2013-07-11

## 2013-07-10 MED ORDER — LEVALBUTEROL HCL 0.63 MG/3ML IN NEBU
0.6300 mg | INHALATION_SOLUTION | Freq: Four times a day (QID) | RESPIRATORY_TRACT | Status: DC
  Administered 2013-07-10 – 2013-07-11 (×2): 0.63 mg via RESPIRATORY_TRACT
  Filled 2013-07-10 (×6): qty 3

## 2013-07-10 MED ORDER — DOCUSATE SODIUM 100 MG PO CAPS
100.0000 mg | ORAL_CAPSULE | Freq: Two times a day (BID) | ORAL | Status: DC
  Administered 2013-07-10 – 2013-07-11 (×2): 100 mg via ORAL
  Filled 2013-07-10 (×3): qty 1

## 2013-07-10 NOTE — ED Notes (Signed)
Patient still in Radiology. Family waiting in the room.

## 2013-07-10 NOTE — ED Notes (Signed)
[facility] [place] [facility] with [place]opal episode lasting 01-15 sec. EMS states patient was A/O. Patient then had a syncopal episode with EMS that lasted 10-15 sec with right arm tremor. Patient was alert with no postical state. VSS, EKG SB. EMS administered NS 500ml bolus, Glucagon 2mg  IV, Zofran 4mg  IV.

## 2013-07-10 NOTE — ED Notes (Signed)
Patient has increased expiratory wheezing. EDP notified.

## 2013-07-10 NOTE — ED Notes (Signed)
Son is at bedside. Patient states he didn't sleep last night and he keeps drifting off to sleep on us. He is slow in his responses, son states this is normal for him. Patient was seen by cardiology several months ago for bradycardia with a rate in the 30's.

## 2013-07-10 NOTE — H&P (Addendum)
[facility] History and Physical  Rosine BeatRalph B Szeto ZOX:[id number] DOB: 1918-05-24 DOA: 07/10/2013  Referring physician: Renae GlossShelton PCP: [name],[name], MD   Chief Complaint: "fainting spell"  HPI: Rosine BeatRalph B Bacote is a 80 y.o. male  Had a witness syncopal episode lasting about one minute. Pt reports he felt light headed prior. No injury. In ambulance had another brief unresponsive episode. In ED, heart rate initially in the 30s. Now  In the 70s. Pt feels back to baseline. Patient has h/o a fib and takes atenolol. Cardiologist is Dr. [name]. No CP, dyspnea, palpitations.  Also in ED, noted to be wheezing. Per daughter, has chronic wheeze, and "does not take inhaler as he should". Denies cough, F/C  Review of Systems:  Systems reviewed. As above otherwise negative.  Past Medical History  Diagnosis Date  . GERD (gastroesophageal reflux disease)   . Hypertension   . COPD (chronic obstructive pulmonary disease)   . Chronic kidney disease   . Ventricular bigeminy   . PVC (premature ventricular contraction)   . Dyspnea   . Thyroid disease   . CHF (congestive heart failure)    Past Surgical History  Procedure Laterality Date  . Cholecystectomy    . Splenectomy, partial    . Esophagogastroduodenoscopy N/A 04/26/2013    Procedure: ESOPHAGOGASTRODUODENOSCOPY (EGD);  Surgeon: Florencia Reasonsobert V Buccini, MD;  [facility];  Service: Endoscopy;  Laterality: N/A;  . Esophagogastroduodenoscopy (egd) with esophageal dilation N/A 04/26/2013    Procedure: ESOPHAGOGASTRODUODENOSCOPY (EGD) WITH ESOPHAGEAL DILATION;  Surgeon: Florencia Reasonsobert V Buccini, MD;  Location: [facility] ENDOSCOPY;  Service: Endoscopy;  Laterality: N/A;  . Hip arthroplasty Right 04/25/2013    Procedure: ARTHROPLASTY BIPOLAR HIP;  Surgeon: Nestor LewandowskyFrank J Rowan, MD;  Location: [facility] OR;  Service: Orthopedics;  Laterality: Right;   Social History:  reports that he has never smoked. He does not have any smokeless tobacco history on file. He reports  that he does not drink alcohol or use illicit drugs.  Allergies  Allergen Reactions  . Diltiazem Hcl Er Beads Other (See Comments)    Tachycardia  . Metoprolol Succinate Er [Metoprolol Tartrate] Other (See Comments)    hallucinations    Family History  Problem Relation Age of Onset  . Hypertension Father      Prior to Admission medications   Medication Sig Start Date End Date Taking? Authorizing Provider  acetaminophen (TYLENOL) 500 MG tablet Take 1,000 mg by mouth every 8 (eight) hours as needed for mild pain.   Yes Historical Provider, MD  albuterol (PROVENTIL HFA;VENTOLIN HFA) 108 (90 BASE) MCG/ACT inhaler Inhale 2 puffs into the lungs every 6 (six) hours as needed for wheezing.   Yes Historical Provider, MD  atenolol (TENORMIN) 25 MG tablet Take 25 mg by mouth daily. For HTN 12/20/12  Yes Corky CraftsJayadeep S Varanasi, MD  flecainide (TAMBOCOR) 100 MG tablet Take 50 mg by mouth 2 (two) times daily. For  A-Fib   Yes Historical Provider, MD  furosemide (LASIX) 40 MG tablet Take 40 mg by mouth daily as needed.   Yes Historical Provider, MD  latanoprost (XALATAN) 0.005 % ophthalmic solution Place 1 drop into both eyes at bedtime. For glaucoma   Yes Historical Provider, MD  levothyroxine (SYNTHROID, LEVOTHROID) 100 MCG tablet Take 100 mcg by mouth daily before breakfast. For hypothyroidism   Yes Historical Provider, MD  omeprazole (PRILOSEC) 20 MG capsule Take 20 mg by mouth daily. For GERD   Yes Historical Provider, MD  warfarin (COUMADIN) 2.5 MG tablet Take 2.5-3 mg  by mouth See admin instructions. Take 2.5 mg every day, except on mondays and fridays take 3 mg.   Yes Historical Provider, MD   Physical Exam: Filed Vitals:   07/10/13 1642  BP: 154/80  Pulse: 64  Temp:   Resp: 16    BP 154/80  Pulse 64  Temp(Src) 97.5 F (36.4 C) (Oral)  Resp 16  SpO2 95%  BP 151/65  Pulse 80  Temp(Src) 98.9 F (37.2 C) (Oral)  Resp 18  Ht 5\' 8"  (1.727 m)  Wt 82.1 kg (181 lb)  BMI 27.53 kg/m2   SpO2 92%  Tele: NSR. Rate 70  General Appearance:    Alert, cooperative, no distress, appears stated age  Head:    Normocephalic, without obvious abnormality, atraumatic  Eyes:    PERRL, conjunctiva/corneas clear, EOM's intact, fundi    benign, both eyes          Nose:   Nares normal, septum midline, mucosa normal, no drainage   or sinus tenderness  Throat:   Lips, mucosa, and tongue normal; teeth and gums normal  Neck:   Supple, symmetrical, trachea midline, no adenopathy;       thyroid:  No enlargement/tenderness/nodules; no carotid   bruit or JVD  Back:     Symmetric, no curvature, ROM normal, no CVA tenderness  Lungs:     Intermittent wheeze  Chest wall:    No tenderness or deformity  Heart:    Regular rate and rhythm, S1 and S2 normal, no murmur, rub   or gallop  Abdomen:     Soft, non-tender, bowel sounds active all four quadrants,    no masses, no organomegaly  Genitalia:   deferred  Rectal:   deferred  Extremities:   Extremities normal, atraumatic, no cyanosis or edema  Pulses:   2+ and symmetric all extremities  Skin:   Skin color, texture, turgor normal, no rashes or lesions  Lymph nodes:   Cervical, supraclavicular, and axillary nodes normal  Neurologic:   CNII-XII intact. Normal strength, sensation and reflexes      throughout            Psych: normal affect  Labs on Admission:  Basic Metabolic Panel:  Recent Labs Lab 07/10/13 1210  NA 140  K 4.9  CL 105  CO2 24  GLUCOSE 110*  BUN 30*  CREATININE 1.69*  CALCIUM 8.5   Liver Function Tests:  Recent Labs Lab 07/10/13 1210  AST 16  ALT 9  ALKPHOS 135*  BILITOT 0.3  PROT 6.6  ALBUMIN 3.2*   No results found for this basename: LIPASE, AMYLASE,  in the last 168 hours No results found for this basename: AMMONIA,  in the last 168 hours CBC:  Recent Labs Lab 07/10/13 1210  WBC 13.8*  NEUTROABS 9.7*  HGB 12.3*  HCT 37.7*  MCV 99.0  PLT 253   Cardiac Enzymes: No results found for this  basename: CKTOTAL, CKMB, CKMBINDEX, TROPONINI,  in the last 168 hours  BNP (last 3 results)  Recent Labs  12/19/12 1615  PROBNP 669.1*   CBG: No results found for this basename: GLUCAP,  in the last 168 hours  Radiological Exams on Admission: Dg Chest 2 View  07/10/2013   CLINICAL DATA:  Syncope.  EXAM: CHEST  2 VIEW  COMPARISON:  04/24/2013  FINDINGS: Coarse opacity is again seen in the right lung base which likely represents scarring or fibrosis, with acute infiltrate considered less likely. Pulmonary hyperinflation is again seen, consistent  with COPD. There is no evidence of pleural effusion. Cardiomegaly is noted and stable. No evidence of congestive heart failure. Scarring is also seen in the anterior right upper lobe.  IMPRESSION: No significant change in coarse opacity in the right lung base, likely due to scarring or fibrosis. Continued radiographic followup recommended.  COPD and right upper lobe scarring.  Cardiomegaly.  No evidence of congestive heart failure.   Electronically Signed   By: Myles Rosenthal M.D.   On: 07/10/2013 13:52   Ct Head Wo Contrast  07/10/2013   CLINICAL DATA:  Syncopal episode.  Anticoagulated.  EXAM: CT HEAD WITHOUT CONTRAST  TECHNIQUE: Contiguous axial images were obtained from the base of the skull through the vertex without contrast.  COMPARISON:  12/08/2004.  FINDINGS: No evidence for acute infarction, hemorrhage, mass lesion, hydrocephalus, or extra-axial fluid. Mild to moderate atrophy. Chronic microvascular ischemic change. Vascular calcification. Calvarium intact. Moderate ethmoid, sphenoid, and frontal sinus mucosal thickening and fluid accumulation without definite air-fluid level. Maxillary sinuses essentially clear. No mastoid fluid. Similar appearance to priors.  IMPRESSION: Chronic sinusitis. No acute intracranial findings. Stable atrophy and small vessel disease.   Electronically Signed   By: Davonna Belling M.D.   On: 07/10/2013 14:02    EKG: Sinus  bradycardia. Rate 43  Assessment/Plan Principal Problem:   Syncope: likely due to bradycardia. D/c atenolol and monitor on tele, obs. Pt eval Active Problems:   Bradycardia, resolved. See above   Hypertension   Hypothyroidism: check TSH   Atrial fibrillation: on coumadin   COPD (chronic obstructive pulmonary disease): per daughter, chronic wheeze, continue bronchodilators   Chronic anticoagulation   Chronic kidney disease (CKD) stage 3, at baseline Recent hip fx  Code Status: full Family Communication: daugher Disposition Plan: home with daughter  Time spent: 78 min  Christiane Ha, MD [facility] Pager [phone]

## 2013-07-10 NOTE — ED Provider Notes (Signed)
CSN: [id number]     Arrival date & time 07/10/13  1115 History   First MD Initiated Contact with Patient 07/10/13 1208     Chief Complaint  Patient presents with  . Loss of Consciousness     (Consider location/radiation/quality/duration/timing/severity/associated sxs/prior Treatment) Patient is a 80 y.o. male presenting with syncope.  Loss of Consciousness  Pt with history as below was [place] when he apparently had sudden loss of consciousness lasting less than a minute with rapid return to baseline. He does not remember the episode but states there may have been some blurry vision before. He denies any CP or SOB (above baseline COPD). EMS was called. Apparently while en route he had another brief loss of consciousness, one episode of vomiting and shaking of his R arm. This all resolved quickly and he was back to baseline. His family at bedside states he didn't sleep well last night and that when he sleeps he often moves his arms around a lot. He fell asleep toward the end of my evaluation while I was discussing with family.  Past Medical History  Diagnosis Date  . GERD (gastroesophageal reflux disease)   . Hypertension   . COPD (chronic obstructive pulmonary disease)   . Chronic kidney disease   . Ventricular bigeminy   . PVC (premature ventricular contraction)   . Dyspnea   . Thyroid disease   . CHF (congestive heart failure)    Past Surgical History  Procedure Laterality Date  . Cholecystectomy    . Splenectomy, partial    . Esophagogastroduodenoscopy N/A 04/26/2013    Procedure: ESOPHAGOGASTRODUODENOSCOPY (EGD);  Surgeon: Florencia Reasonsobert V Buccini, MD;  [facility];  Service: Endoscopy;  Laterality: N/A;  . Esophagogastroduodenoscopy (egd) with esophageal dilation N/A 04/26/2013    Procedure: ESOPHAGOGASTRODUODENOSCOPY (EGD) WITH ESOPHAGEAL DILATION;  Surgeon: Florencia Reasonsobert V Buccini, MD;  Location: [facility]OS[facility];  Service: Endoscopy;  Laterality:  N/A;  . Hip arthroplasty Right 04/25/2013    Procedure: ARTHROPLASTY BIPOLAR HIP;  Surgeon: Nestor LewandowskyFrank J Rowan, MD;  Location: [facility] OR;  Service: Orthopedics;  Laterality: Right;   Family History  Problem Relation Age of Onset  . Hypertension Father    History  Substance Use Topics  . Smoking status: Never Smoker   . Smokeless tobacco: Not on file  . Alcohol Use: No    Review of Systems  Cardiovascular: Positive for syncope.    All other systems reviewed and are negative except as noted in HPI.    Allergies  Diltiazem hcl er beads and Metoprolol succinate er  Home Medications   Prior to Admission medications   Medication Sig Start Date End Date Taking? Authorizing Provider  acetaminophen (TYLENOL) 500 MG tablet Take 1,000 mg by mouth every 8 (eight) hours as needed for mild pain.   Yes Historical Provider, MD  albuterol (PROVENTIL HFA;VENTOLIN HFA) 108 (90 BASE) MCG/ACT inhaler Inhale 2 puffs into the lungs every 6 (six) hours as needed for wheezing.   Yes Historical Provider, MD  atenolol (TENORMIN) 25 MG tablet Take 25 mg by mouth daily. For HTN 12/20/12  Yes Corky CraftsJayadeep S Varanasi, MD  flecainide (TAMBOCOR) 100 MG tablet Take 50 mg by mouth 2 (two) times daily. For  A-Fib   Yes Historical Provider, MD  furosemide (LASIX) 40 MG tablet Take 40 mg by mouth daily as needed.   Yes Historical Provider, MD  latanoprost (XALATAN) 0.005 % ophthalmic solution Place 1 drop into both eyes at bedtime. For glaucoma  Yes Historical Provider, MD  levothyroxine (SYNTHROID, LEVOTHROID) 100 MCG tablet Take 100 mcg by mouth daily before breakfast. For hypothyroidism   Yes Historical Provider, MD  omeprazole (PRILOSEC) 20 MG capsule Take 20 mg by mouth daily. For GERD   Yes Historical Provider, MD  warfarin (COUMADIN) 2.5 MG tablet Take 2.5-3 mg by mouth See admin instructions. Take 2.5 mg every day, except on mondays and fridays take 3 mg.   Yes Historical Provider, MD   BP 131/60  Pulse 79  Temp(Src)  97.5 F (36.4 C) (Oral)  Resp 15  SpO2 97% Physical Exam  Nursing note and vitals reviewed. Constitutional: He is oriented to person, place, and time. He appears well-developed and well-nourished.  HENT:  Head: Normocephalic and atraumatic.  Eyes: EOM are normal. Pupils are equal, round, and reactive to light.  Neck: Normal range of motion. Neck supple.  Cardiovascular: Normal heart sounds and intact distal pulses.  Bradycardia present.   Pulmonary/Chest: Effort normal and breath sounds normal.  Abdominal: Bowel sounds are normal. He exhibits no distension. There is no tenderness.  Musculoskeletal: Normal range of motion. He exhibits no edema and no tenderness.  Neurological: He is alert and oriented to person, place, and time. He has normal strength. No cranial nerve deficit or sensory deficit.  Skin: Skin is warm and dry. No rash noted.  Psychiatric: He has a normal mood and affect.    ED Course  Procedures (including critical care time) Labs Review Labs Reviewed  CBC WITH DIFFERENTIAL - Abnormal; Notable for the following:    WBC 13.8 (*)    RBC 3.81 (*)    Hemoglobin 12.3 (*)    HCT 37.7 (*)    Neutro Abs 9.7 (*)    Monocytes Absolute 1.2 (*)    All other components within normal limits  PROTIME-INR - Abnormal; Notable for the following:    Prothrombin Time 23.9 (*)    INR 2.22 (*)    All other components within normal limits  APTT - Abnormal; Notable for the following:    aPTT 44 (*)    All other components within normal limits  COMPREHENSIVE METABOLIC PANEL - Abnormal; Notable for the following:    Glucose, Bld 110 (*)    BUN 30 (*)    Creatinine, Ser 1.69 (*)    Albumin 3.2 (*)    Alkaline Phosphatase 135 (*)    GFR calc non Af Amer 33 (*)    GFR calc Af Amer 38 (*)    All other components within normal limits  URINALYSIS, ROUTINE W REFLEX MICROSCOPIC    Imaging Review Dg Chest 2 View  07/10/2013   CLINICAL DATA:  Syncope.  EXAM: CHEST  2 VIEW  COMPARISON:   04/24/2013  FINDINGS: Coarse opacity is again seen in the right lung base which likely represents scarring or fibrosis, with acute infiltrate considered less likely. Pulmonary hyperinflation is again seen, consistent with COPD. There is no evidence of pleural effusion. Cardiomegaly is noted and stable. No evidence of congestive heart failure. Scarring is also seen in the anterior right upper lobe.  IMPRESSION: No significant change in coarse opacity in the right lung base, likely due to scarring or fibrosis. Continued radiographic followup recommended.  COPD and right upper lobe scarring.  Cardiomegaly.  No evidence of congestive heart failure.   Electronically Signed   By: Myles Rosenthal M.D.   On: 07/10/2013 13:52   Ct Head Wo Contrast  07/10/2013   CLINICAL DATA:  Syncopal  episode.  Anticoagulated.  EXAM: CT HEAD WITHOUT CONTRAST  TECHNIQUE: Contiguous axial images were obtained from the base of the skull through the vertex without contrast.  COMPARISON:  12/08/2004.  FINDINGS: No evidence for acute infarction, hemorrhage, mass lesion, hydrocephalus, or extra-axial fluid. Mild to moderate atrophy. Chronic microvascular ischemic change. Vascular calcification. Calvarium intact. Moderate ethmoid, sphenoid, and frontal sinus mucosal thickening and fluid accumulation without definite air-fluid level. Maxillary sinuses essentially clear. No mastoid fluid. Similar appearance to priors.  IMPRESSION: Chronic sinusitis. No acute intracranial findings. Stable atrophy and small vessel disease.   Electronically Signed   By: Davonna BellingJohn  Curnes M.D.   On: 07/10/2013 14:02     EKG Interpretation   Date/Time:  Thursday July 10 2013 11:22:44 EDT Ventricular Rate:  46 PR Interval:  218 QRS Duration: 106 QT Interval:  489 QTC Calculation: 428 R Axis:   83 Text Interpretation:  Sinus bradycardia Borderline right axis deviation  Since last tracing Rate slower Confirmed by Vin Yonke  MD, Leonette MostHARLES [phone]([id number])  on 07/10/2013 12:09:15  PM      MDM   Final diagnoses:  Syncope  Bradycardia    Pt with ?syncope x 2 with bradycardia on arrival. Improved during ED stay. Admit for further observation.    Glady Ouderkirk B. Bernette MayersSheldon, MD 07/10/13 1700

## 2013-07-10 NOTE — ED Notes (Signed)
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

## 2013-07-10 NOTE — ED Notes (Signed)
Patient given urinal to obtain urine specimen.

## 2013-07-11 DIAGNOSIS — I498 Other specified cardiac arrhythmias: Secondary | ICD-10-CM

## 2013-07-11 DIAGNOSIS — R55 Syncope and collapse: Secondary | ICD-10-CM

## 2013-07-11 DIAGNOSIS — I5032 Chronic diastolic (congestive) heart failure: Secondary | ICD-10-CM

## 2013-07-11 DIAGNOSIS — N183 Chronic kidney disease, stage 3 unspecified: Secondary | ICD-10-CM

## 2013-07-11 DIAGNOSIS — Z7901 Long term (current) use of anticoagulants: Secondary | ICD-10-CM

## 2013-07-11 LAB — CBC
HEMATOCRIT: 35.1 % — AB (ref 39.0–52.0)
HEMOGLOBIN: 11.7 g/dL — AB (ref 13.0–17.0)
MCH: 32.7 pg (ref 26.0–34.0)
MCHC: 33.3 g/dL (ref 30.0–36.0)
MCV: 98 fL (ref 78.0–100.0)
Platelets: 267 10*3/uL (ref 150–400)
RBC: 3.58 MIL/uL — ABNORMAL LOW (ref 4.22–5.81)
RDW: 14.2 % (ref 11.5–15.5)
WBC: 10.5 10*3/uL (ref 4.0–10.5)

## 2013-07-11 LAB — PROTIME-INR
INR: 2.27 — AB (ref 0.00–1.49)
Prothrombin Time: 24.3 seconds — ABNORMAL HIGH (ref 11.6–15.2)

## 2013-07-11 LAB — TSH: TSH: 2 u[IU]/mL (ref 0.350–4.500)

## 2013-07-11 MED ORDER — LEVALBUTEROL HCL 0.63 MG/3ML IN NEBU
0.6300 mg | INHALATION_SOLUTION | Freq: Three times a day (TID) | RESPIRATORY_TRACT | Status: DC
  Administered 2013-07-11: 0.63 mg via RESPIRATORY_TRACT
  Filled 2013-07-11: qty 3

## 2013-07-11 MED ORDER — LEVALBUTEROL HCL 0.63 MG/3ML IN NEBU: 0.6300 mg | INHALATION_SOLUTION | Freq: Four times a day (QID) | RESPIRATORY_TRACT | Status: DC | PRN

## 2013-07-11 NOTE — Progress Notes (Signed)
[facility] PROGRESS NOTE Assessment/Plan: Syncope due to Bradycardia: - likely due to bradycardia. - atenolol held on admission, HR now > 60. Orthostatics negative. - will go home off atenolol.  Chronic kidney disease (CKD) stage G3a/A1, moderately decreased glomerular filtration rate (GFR) between 45-59 mL/min/1.73 square meter and albuminuria creatinine ratio less than 30 mg/g - at baseline.  COPD (chronic obstructive pulmonary disease) - stable  Chronic anticoagulation/Atrial fibrillation - SR> INR therapeutic rate.  Hypertension - well controlled.   Code Status: full  Family Communication: daugher  Disposition Plan: home with daughter    Consultants:  none  Procedures:  CXR  Antibiotics:  none (indicate start date, and stop date if known)  HPI/Subjective: No complain. He relates he has had no further episodes  Objective: Filed Vitals:   07/11/13 0918 07/11/13 0920 07/11/13 0921 07/11/13 0927  BP: 131/36 126/51 134/57   Pulse: 65 68 71   Temp: 97.5 F (36.4 C) 97.5 F (36.4 C) 97.5 F (36.4 C)   TempSrc: Oral Oral Oral   Resp: 18 18 18    Height:      Weight:      SpO2: 96% 96% 96% 94%    Intake/Output Summary (Last 24 hours) at 07/11/13 0948 Last data filed at 07/11/13 0847  Gross per 24 hour  Intake    480 ml  Output    450 ml  Net     30 ml   Filed Weights   07/10/13 1858 07/11/13 0639  Weight: 82.1 kg (181 lb) 79.652 kg (175 lb 9.6 oz)    Exam:  General: Alert, awake, oriented x3, in no acute distress.  HEENT: No bruits, no goiter.  Heart: Regular rate and rhythm, without murmurs, rubs, gallops.  Lungs: Good air movement, clear Abdomen: Soft, nontender, nondistended, positive bowel sounds.    Data Reviewed: Basic Metabolic Panel:  Recent Labs Lab 07/10/13 1210  NA 140  K 4.9  CL 105  CO2 24  GLUCOSE 110*  BUN 30*  CREATININE 1.69*  CALCIUM 8.5   Liver Function Tests:  Recent Labs Lab 07/10/13 1210    AST 16  ALT 9  ALKPHOS 135*  BILITOT 0.3  PROT 6.6  ALBUMIN 3.2*   No results found for this basename: LIPASE, AMYLASE,  in the last 168 hours No results found for this basename: AMMONIA,  in the last 168 hours CBC:  Recent Labs Lab 07/10/13 1210  WBC 13.8*  NEUTROABS 9.7*  HGB 12.3*  HCT 37.7*  MCV 99.0  PLT 253   Cardiac Enzymes: No results found for this basename: CKTOTAL, CKMB, CKMBINDEX, TROPONINI,  in the last 168 hours BNP (last 3 results)  Recent Labs  12/19/12 1615  PROBNP 669.1*   CBG: No results found for this basename: GLUCAP,  in the last 168 hours  No results found for this or any previous visit (from the past 240 hour(s)).   Studies: Dg Chest 2 View  07/10/2013   CLINICAL DATA:  Syncope.  EXAM: CHEST  2 VIEW  COMPARISON:  04/24/2013  FINDINGS: Coarse opacity is again seen in the right lung base which likely represents scarring or fibrosis, with acute infiltrate considered less likely. Pulmonary hyperinflation is again seen, consistent with COPD. There is no evidence of pleural effusion. Cardiomegaly is noted and stable. No evidence of congestive heart failure. Scarring is also seen in the anterior right upper lobe.  IMPRESSION: No significant change in coarse opacity in the right lung base, likely due to  scarring or fibrosis. Continued radiographic followup recommended.  COPD and right upper lobe scarring.  Cardiomegaly.  No evidence of congestive heart failure.   Electronically Signed   By: Myles RosenthalJohn  Stahl M.D.   On: 07/10/2013 13:52   Ct Head Wo Contrast  07/10/2013   CLINICAL DATA:  Syncopal episode.  Anticoagulated.  EXAM: CT HEAD WITHOUT CONTRAST  TECHNIQUE: Contiguous axial images were obtained from the base of the skull through the vertex without contrast.  COMPARISON:  12/08/2004.  FINDINGS: No evidence for acute infarction, hemorrhage, mass lesion, hydrocephalus, or extra-axial fluid. Mild to moderate atrophy. Chronic microvascular ischemic change.  Vascular calcification. Calvarium intact. Moderate ethmoid, sphenoid, and frontal sinus mucosal thickening and fluid accumulation without definite air-fluid level. Maxillary sinuses essentially clear. No mastoid fluid. Similar appearance to priors.  IMPRESSION: Chronic sinusitis. No acute intracranial findings. Stable atrophy and small vessel disease.   Electronically Signed   By: Davonna BellingJohn  Curnes M.D.   On: 07/10/2013 14:02    Scheduled Meds: . docusate sodium  100 mg Oral BID  . flecainide  50 mg Oral BID  . latanoprost  1 drop Both Eyes QHS  . levalbuterol  0.63 mg Nebulization TID  . levothyroxine  100 mcg Oral QAC breakfast  . pantoprazole  40 mg Oral Daily  . sodium chloride  3 mL Intravenous Q12H  . warfarin  2.5 mg Oral Once per day on Sun Tue Wed Thu Sat  . warfarin  3 mg Oral Once per day on Mon Fri  . Warfarin - Physician Dosing Inpatient   Does not apply q1800   Continuous Infusions:    Jordan [name]  [facility] Pager [phone].  If 8PM-8AM, please contact night-coverage at [email or web address], password [email or web address] 07/11/2013, 9:48 AM  LOS: 1 day

## 2013-07-11 NOTE — Evaluation (Signed)
Physical Therapy Evaluation Patient Details Name: Jordan BeatRalph B [name] MRN: [id number] DOB: 11-15-1918 Today's Date: 07/11/2013   History of Present Illness  Patient is a 80 y/o male admitted due to syncopal episode lasting about one minute. Pt reports he felt light headed prior. No injury. In ambulance had another brief unresponsive episode. In ED, heart rate initially in the 30s. Now  In the 70s. Pt feels back to baseline. Patient has h/o a fib and takes atenolol.  Clinical Impression  Patient presents with mobility not far from recent baseline.  States daughter lives with him but works, and other family from surrounding area come to assist in her absence, primarily daughter across the street who has recently resumed his habit of [facility] for [facility].  States staff there supervise him with ambulation as well.  Feel support is in place to assist despite patient high fall risk.  Feel no follow up PT needs due to having recent resumption of community access with senior resource center and son in agreement.    Follow Up Recommendations No PT follow up    Equipment Recommendations       Recommendations for Other Services       Precautions / Restrictions Precautions Precautions: Fall      Mobility  Bed Mobility               General bed mobility comments: pt sitting edge of bed  Transfers Overall transfer level: Needs assistance Equipment used: Rolling walker (2 wheeled) Transfers: Sit to/from Stand Sit to Stand: Min assist         General transfer comment: initially leaning posteriorly; sits with admitted uncontrolled descent, but reaches to touch support surfaces without cues  Ambulation/Gait Ambulation/Gait assistance: Min guard Ambulation Distance (Feet): 200 Feet Assistive device: Rolling walker (2 wheeled) Gait Pattern/deviations: Step-through pattern;Trunk flexed;Decreased stride length     General Gait Details: increased time  for turns without loss of balance, but high fall risk due to left ankle limitations and postural deficits as well as slowed reactions  Stairs            Wheelchair Mobility    Modified Rankin (Stroke Patients Only)       Balance Overall balance assessment: Needs assistance           Standing balance-Leahy Scale: Poor Standing balance comment: leaning back initially in standing; increased time to gait postural stability in standing (reports stiff after sitting prolonged period.)  High fall risk                             Pertinent Vitals/Pain No pain    Home Living Family/patient expects to be discharged to:: Private residence Living Arrangements: Children Available Help at Discharge: Family Type of Home: House Home Access: Stairs to enter Entrance Stairs-Rails: Right Entrance Stairs-Number of Steps: 2&1 Home Layout: Two level;Able to live on main level with bedroom/bathroom Home Equipment: Dan HumphreysWalker - 2 wheels;Walker - standard      Prior Function Level of Independence: Independent with assistive device(s);Needs assistance   Gait / Transfers Assistance Needed: supervision by family at home           Hand Dominance        Extremity/Trunk Assessment               Lower Extremity Assessment: LLE deficits/detail   LLE Deficits / Details: decreased ankle AROM due to h/o spurring,  etc; DF approx -25 degrees  Cervical / Trunk Assessment: Kyphotic  Communication   Communication: No difficulties  Cognition Arousal/Alertness: Awake/alert Behavior During Therapy: WFL for tasks assessed/performed Overall Cognitive Status: History of cognitive impairments - at baseline                      General Comments      Exercises        Assessment/Plan    PT Assessment Patent does not need any further PT services  PT Diagnosis Abnormality of gait   PT Problem List    PT Treatment Interventions     PT Goals (Current goals can be  found in the Care Plan section) Acute Rehab PT Goals PT Goal Formulation: No goals set, d/c therapy    Frequency     Barriers to discharge        Co-evaluation               End of Session Equipment Utilized During Treatment: Gait belt Activity Tolerance: Patient tolerated treatment well Patient left: in bed;with call bell/phone within reach;with family/visitor present      Functional Assessment Tool Used: Clinical Judgement Functional Limitation: Mobility: Walking and moving around Mobility: Walking and Moving Around Current Status ([id number]): At least 20 percent but less than 40 percent impaired, limited or restricted Mobility: Walking and Moving Around Goal Status [phone](G8979): At least 20 percent but less than 40 percent impaired, limited or restricted Mobility: Walking and Moving Around Discharge Status [phone](G8980): At least 20 percent but less than 40 percent impaired, limited or restricted    Time: 1208-1231 PT Time Calculation (min): 23 min   Charges:   PT Evaluation $Initial PT Evaluation Tier I: 1 Procedure PT Treatments $Gait Training: 8-22 mins   PT G Codes:   Functional Assessment Tool Used: Clinical Judgement Functional Limitation: Mobility: Walking and moving around    American FinancialCynthia R Wynn 07/11/2013, 12:41 PM Sheran Lawlessyndi Wynn, [place]PT [phone] 07/11/2013

## 2013-07-11 NOTE — Progress Notes (Signed)
UR Completed.  Juandavid Dallman Jane Omarii Scalzo [phone] 07/11/2013  

## 2013-07-11 NOTE — Progress Notes (Signed)
Pt's daughter at bedside and stated here to pick pt up.  Dr. Pricilla HolmFeliz-0rtiz notified and instructed that ok for pt to be d/c home.  Amanda PeaNellie Watt Geiler, Charity fundraiserN.

## 2013-07-11 NOTE — Progress Notes (Signed)
Pt. Alert but confused this am. RN reoriented pt. To place, time, and situation. Pt. Removed telemetry leads multiple times during the night. RN educated pt. On importance of keeping telemetry leads in place. Pt. Stated he thought he was at home. RN will continue to monitor pt.

## 2013-07-11 NOTE — Progress Notes (Signed)
Noted pt with order to be d/c'd home.  Family at bed side informed nurse that pt will be going home after lunch or sometime this afternoon as they spoke with Dr. David StallFeliz-Ortiz who stated that pt will be d/c'd this afternoon.  D/c time verified with Dr. David Stallfeliz-Ortiz and instructed not to d/c pt home now until this afternoon.  [facility].  Amanda PeaNellie Baylen Dea, Charity fundraiserN.

## 2013-07-11 NOTE — Discharge Summary (Signed)
Physician Discharge Summary  Jordan BeatRalph B [name] [id number] DOB: 06-25-1918 DOA: 07/10/2013  PCP: [name],Jordan OLIVER, MD  Admit date: 07/10/2013 Discharge date: 07/11/2013  Time spent: 32 minutes  Recommendations for Outpatient Follow-up:  1. Follow up with Dr. [name] next week.  Discharge Diagnoses:  Principal Problem:   Syncope Active Problems:   Bradycardia   Hypertension   Hypothyroidism   Atrial fibrillation   COPD (chronic obstructive pulmonary disease)   Chronic anticoagulation   Chronic kidney disease (CKD) stage G3a/A1, moderately decreased glomerular filtration rate (GFR) between 45-59 mL/min/1.73 square meter and albuminuria creatinine ratio less than 30 mg/g   Discharge Condition: stable  Diet recommendation: regular  Filed Weights   07/10/13 1858 07/11/13 0639  Weight: 82.1 kg (181 lb) 79.652 kg (175 lb 9.6 oz)    History of present illness:  80 y.o. male  Had a witness syncopal episode lasting about one minute. Pt reports he felt light headed prior. No injury. In ambulance had another brief unresponsive episode. In ED, heart rate initially in the 30s. Now In the 70s. Pt feels back to baseline. Patient has h/o a fib and takes atenolol. Cardiologist is Dr. [name]. No CP, dyspnea, palpitations   Hospital Course:  Syncope due to Bradycardia:  - likely due to bradycardia.  - atenolol held on admission, HR now > 60. Orthostatics negative.  - will go home off atenolol.  - follow up with Dr. [name] next week.  Chronic kidney disease (CKD) stage G3a/A1, moderately decreased glomerular filtration rate (GFR) between 45-59 mL/min/1.73 square meter and albuminuria creatinine ratio less than 30 mg/g  - at baseline.   COPD (chronic obstructive pulmonary disease)  - stable   Chronic anticoagulation/Atrial fibrillation  - SR> INR therapeutic rate.   Hypertension  - well controlled.  Procedures:  CXR (i.e. Studies not automatically included, echos, thoracentesis,  etc; not x-rays)  Consultations:  none  Discharge Exam: Filed Vitals:   07/11/13 0921  BP: 134/57  Pulse: 71  Temp: 97.5 F (36.4 C)  Resp: 18    General: See progress note  Discharge Instructions You were cared for by a hospitalist during your hospital stay. If you have any questions about your discharge medications or the care you received while you were in the hospital after you are discharged, you can call the unit and asked to speak with the hospitalist on call if the hospitalist that took care of you is not available. Once you are discharged, your primary care physician will handle any further medical issues. Please note that NO REFILLS for any discharge medications will be authorized once you are discharged, as it is imperative that you return to your primary care physician (or establish a relationship with a primary care physician if you do not have one) for your aftercare needs so that they can reassess your need for medications and monitor your lab values.      Discharge Orders   Future Orders Complete By Expires   Diet - low sodium heart healthy  As directed    Increase activity slowly  As directed        Medication List    STOP taking these medications       atenolol 25 MG tablet  Commonly known as:  TENORMIN      TAKE these medications       acetaminophen 500 MG tablet  Commonly known as:  TYLENOL  Take 1,000 mg by mouth every 8 (eight) hours as needed for mild pain.  albuterol 108 (90 BASE) MCG/ACT inhaler  Commonly known as:  PROVENTIL HFA;VENTOLIN HFA  Inhale 2 puffs into the lungs every 6 (six) hours as needed for wheezing.     flecainide 100 MG tablet  Commonly known as:  TAMBOCOR  Take 50 mg by mouth 2 (two) times daily. For  A-Fib     furosemide 40 MG tablet  Commonly known as:  LASIX  Take 40 mg by mouth daily as needed.     latanoprost 0.005 % ophthalmic solution  Commonly known as:  XALATAN  Place 1 drop into both eyes at bedtime.  For glaucoma     levothyroxine 100 MCG tablet  Commonly known as:  SYNTHROID, LEVOTHROID  Take 100 mcg by mouth daily before breakfast. For hypothyroidism     omeprazole 20 MG capsule  Commonly known as:  PRILOSEC  Take 20 mg by mouth daily. For GERD     warfarin 2.5 MG tablet  Commonly known as:  COUMADIN  Take 2.5-3 mg by mouth See admin instructions. Take 2.5 mg every day, except on mondays and fridays take 3 mg.       Allergies  Allergen Reactions  . Diltiazem Hcl Er Beads Other (See Comments)    Tachycardia  . Metoprolol Succinate Er [Metoprolol Tartrate] Other (See Comments)    hallucinations      The results of significant diagnostics from this hospitalization (including imaging, microbiology, ancillary and laboratory) are listed below for reference.    Significant Diagnostic Studies: Dg Chest 2 View  07/10/2013   CLINICAL DATA:  Syncope.  EXAM: CHEST  2 VIEW  COMPARISON:  04/24/2013  FINDINGS: Coarse opacity is again seen in the right lung base which likely represents scarring or fibrosis, with acute infiltrate considered less likely. Pulmonary hyperinflation is again seen, consistent with COPD. There is no evidence of pleural effusion. Cardiomegaly is noted and stable. No evidence of congestive heart failure. Scarring is also seen in the anterior right upper lobe.  IMPRESSION: No significant change in coarse opacity in the right lung base, likely due to scarring or fibrosis. Continued radiographic followup recommended.  COPD and right upper lobe scarring.  Cardiomegaly.  No evidence of congestive heart failure.   Electronically Signed   By: Myles RosenthalJohn  Stahl M.D.   On: 07/10/2013 13:52   Ct Head Wo Contrast  07/10/2013   CLINICAL DATA:  Syncopal episode.  Anticoagulated.  EXAM: CT HEAD WITHOUT CONTRAST  TECHNIQUE: Contiguous axial images were obtained from the base of the skull through the vertex without contrast.  COMPARISON:  12/08/2004.  FINDINGS: No evidence for acute  infarction, hemorrhage, mass lesion, hydrocephalus, or extra-axial fluid. Mild to moderate atrophy. Chronic microvascular ischemic change. Vascular calcification. Calvarium intact. Moderate ethmoid, sphenoid, and frontal sinus mucosal thickening and fluid accumulation without definite air-fluid level. Maxillary sinuses essentially clear. No mastoid fluid. Similar appearance to priors.  IMPRESSION: Chronic sinusitis. No acute intracranial findings. Stable atrophy and small vessel disease.   Electronically Signed   By: Davonna BellingJohn  Curnes M.D.   On: 07/10/2013 14:02    Microbiology: No results found for this or any previous visit (from the past 240 hour(s)).   Labs: Basic Metabolic Panel:  Recent Labs Lab 07/10/13 1210  NA 140  K 4.9  CL 105  CO2 24  GLUCOSE 110*  BUN 30*  CREATININE 1.69*  CALCIUM 8.5   Liver Function Tests:  Recent Labs Lab 07/10/13 1210  AST 16  ALT 9  ALKPHOS 135*  BILITOT 0.3  PROT 6.6  ALBUMIN 3.2*   No results found for this basename: LIPASE, AMYLASE,  in the last 168 hours No results found for this basename: AMMONIA,  in the last 168 hours CBC:  Recent Labs Lab 07/10/13 1210  WBC 13.8*  NEUTROABS 9.7*  HGB 12.3*  HCT 37.7*  MCV 99.0  PLT 253   Cardiac Enzymes: No results found for this basename: CKTOTAL, CKMB, CKMBINDEX, TROPONINI,  in the last 168 hours BNP: BNP (last 3 results)  Recent Labs  12/19/12 1615  PROBNP 669.1*   CBG: No results found for this basename: GLUCAP,  in the last 168 hours     Signed:  Marinda Elk  [facility] 07/11/2013, 9:50 AM

## 2013-07-11 NOTE — Progress Notes (Signed)
All d/c instructions explained and given to pt and his daughter.  Verbalized understanding.  Pt d/c off floor via w/c to awaiting transportation to home.  Amanda PeaNellie Sha Amer, Charity fundraiserN.

## 2013-07-14 ENCOUNTER — Ambulatory Visit (INDEPENDENT_AMBULATORY_CARE_PROVIDER_SITE_OTHER): Payer: Medicare Other | Admitting: *Deleted

## 2013-07-14 ENCOUNTER — Ambulatory Visit (INDEPENDENT_AMBULATORY_CARE_PROVIDER_SITE_OTHER): Payer: Medicare Other | Admitting: Physician Assistant

## 2013-07-14 ENCOUNTER — Encounter: Payer: Self-pay | Admitting: Physician Assistant

## 2013-07-14 VITALS — BP 140/70 | HR 77 | Ht 68.0 in | Wt 176.8 lb

## 2013-07-14 DIAGNOSIS — N183 Chronic kidney disease, stage 3 unspecified: Secondary | ICD-10-CM

## 2013-07-14 DIAGNOSIS — I4891 Unspecified atrial fibrillation: Secondary | ICD-10-CM

## 2013-07-14 DIAGNOSIS — I498 Other specified cardiac arrhythmias: Secondary | ICD-10-CM

## 2013-07-14 DIAGNOSIS — R001 Bradycardia, unspecified: Secondary | ICD-10-CM

## 2013-07-14 DIAGNOSIS — I493 Ventricular premature depolarization: Secondary | ICD-10-CM

## 2013-07-14 DIAGNOSIS — N1831 Chronic kidney disease, stage 3a: Secondary | ICD-10-CM

## 2013-07-14 DIAGNOSIS — R55 Syncope and collapse: Secondary | ICD-10-CM

## 2013-07-14 DIAGNOSIS — I4949 Other premature depolarization: Secondary | ICD-10-CM

## 2013-07-14 DIAGNOSIS — I10 Essential (primary) hypertension: Secondary | ICD-10-CM

## 2013-07-14 DIAGNOSIS — I5032 Chronic diastolic (congestive) heart failure: Secondary | ICD-10-CM

## 2013-07-14 DIAGNOSIS — Z5181 Encounter for therapeutic drug level monitoring: Secondary | ICD-10-CM

## 2013-07-14 LAB — POCT INR: INR: 2.5

## 2013-07-14 NOTE — Progress Notes (Signed)
[address] [place], [place]  [address] Phone: [phone] Fax:  [phone]  Date:  07/14/2013   ID:  Jordan BeatRalph B Hacking, DOB [birth year], MRN [id number]  PCP:  [name],[name], MD  Cardiologist:  Dr. Donato SchultzMark [name]     History of Present Illness: Jordan [name] is a 80 y.o. male with a hx of HTN, parox AFib, diastolic CHF, frequent PVCs (28K on 24 Hr Holter in 10/2010) controlled on Flecainide, COPD, chronic diarrhea, CKD.  Last seen by Dr. Donato SchultzMark [name] in 04/2013.  He was admitted 4/30-5/1 with a witnessed syncopal episode.  He had a sensation of lightheadedness prior to his episode.  Head CT was unremarkable.  HR was reportedly in the 30s and Atenolol was stopped.  HR improved to > 60 and he was d/c to home with plans for close follow up.    He tells me that he was sitting at church when he suddenly felt lightheaded. His next recollection was that of the rescue workers attending to him. He denies any confusion. He apparently had another syncopal episode in the ambulance on the way to the hospital. His heart rate was reportedly in the 30s. He has felt well since stopping his beta blocker. He denies any further lightheadedness or syncope. He denies chest pain. He has chronic dyspnea with exertion. He walks with a walker since his hip fracture in 04/2013. He denies orthopnea or PND. He has chronic left lower extremity edema. There have been no changes.  Studies:  - Echo 2012 - normal EF, mild concentric LVH, mild-mod MR   Recent Labs: 12/19/2012: Pro B Natriuretic peptide (BNP) 669.1*  07/10/2013: ALT 9; Creatinine 1.69*; Potassium 4.9  07/11/2013: Hemoglobin 11.7*; TSH 2.000   Wt Readings from Last 3 Encounters:  07/14/13 176 lb 12.8 oz (80.196 kg)  07/11/13 175 lb 9.6 oz (79.652 kg)  05/21/13 182 lb 3.2 oz (82.645 kg)     Past Medical History  Diagnosis Date  . GERD (gastroesophageal reflux disease)   . Hypertension   . COPD (chronic obstructive pulmonary disease)   .  Chronic kidney disease   . Ventricular bigeminy   . PVC (premature ventricular contraction)   . Dyspnea   . Thyroid disease   . CHF (congestive heart failure)     Current Outpatient Prescriptions  Medication Sig Dispense Refill  . acetaminophen (TYLENOL) 500 MG tablet Take 1,000 mg by mouth every 8 (eight) hours as needed for mild pain.      Marland Kitchen. albuterol (PROVENTIL HFA;VENTOLIN HFA) 108 (90 BASE) MCG/ACT inhaler Inhale 2 puffs into the lungs every 6 (six) hours as needed for wheezing.      . flecainide (TAMBOCOR) 100 MG tablet Take 50 mg by mouth 2 (two) times daily. For  A-Fib      . furosemide (LASIX) 40 MG tablet Take 40 mg by mouth daily as needed.      . latanoprost (XALATAN) 0.005 % ophthalmic solution Place 1 drop into both eyes at bedtime. For glaucoma      . levothyroxine (SYNTHROID, LEVOTHROID) 100 MCG tablet Take 100 mcg by mouth daily before breakfast. For hypothyroidism      . omeprazole (PRILOSEC) 20 MG capsule Take 20 mg by mouth daily. For GERD      . warfarin (COUMADIN) 2.5 MG tablet Take 2.5-3 mg by mouth See admin instructions. Take 2.5 mg every day, except on mondays and fridays take 3 mg.       No current  facility-administered medications for this visit.    Allergies:   Diltiazem hcl er beads and Metoprolol succinate er   Social History:  The patient  reports that he has never smoked. He does not have any smokeless tobacco history on file. He reports that he does not drink alcohol or use illicit drugs.   Family History:  The patient's family history includes Hypertension in his father.   ROS:  Please see the history of present illness.      All other systems reviewed and negative.   PHYSICAL EXAM: VS:  BP 140/70  Pulse 77  Ht 5\' 8"  (1.727 m)  Wt 176 lb 12.8 oz (80.196 kg)  BMI 26.89 kg/m2 Well nourished, well developed, in no acute distress HEENT: normal Neck: no JVDat 90 degrees Cardiac:  normal S1, S2; RRR; no murmur Lungs:  Decreased breath sounds  bilaterally, no wheezing, rhonchi or rales Abd: soft, nontender, no hepatomegaly Ext: 1+ LLE edematrace RLE edema Skin: warm and dry Neuro:  CNs 2-12 intact, no focal abnormalities noted  EKG:  NSR, HR 77, rightward axis, nonspecific ST-T wave changes     ASSESSMENT AND PLAN:  1. Syncope: This was likely related to significant bradycardia. His beta blocker has been stopped. He has not had further recurrence. I will arrange a 21 day event monitor to rule out recurrent bradycardia arrhythmias or tachycardia arrhythmias. He does not drive. 2. Atrial fibrillation: Maintaining NSR. He remains on Coumadin. I reviewed his case today with Dr. [name]. As the patient is no longer rate controlling medication, we will discontinue his flecainide. 3. Bradycardia: Normal rate on ECG today. Obtain event monitor as noted. 4. Frequent PVCs: As noted, his flecainide must be discontinued. We will monitor for recurrence of PVCs. 5. Hypertension: Controlled. 6. Chronic kidney disease (CKD) stage G3a/A1: Stable by recent measurements. 7. Chronic diastolic heart failure: Volume stable. 8. Disposition: Follow up with Dr. [name] in one month.  Signed, Tereso NewcomerScott Aleina Burgio, PA-C  07/14/2013 9:15 AM

## 2013-07-14 NOTE — Patient Instructions (Addendum)
Your physician has recommended that you wear an event monitor. Event monitors are medical devices that record the heart's electrical activity. Doctors most often us these monitors to diagnose arrhythmias. Arrhythmias are problems with the speed or rhythm of the heartbeat. The monitor is a small, portable device. You can wear one while you do your normal daily activities. This is usually used to diagnose what is causing palpitations/syncope (passing out).  Your physician has recommended you make the following change in your medication: STOP THE FLECAINIDE  YOU WILL NEED A FOLLOW UP WITH DR. [name] IN ABOUT 1 MONTH ONCE YOUR EVENT MONITOR IS COMPLETED

## 2013-07-16 ENCOUNTER — Ambulatory Visit (INDEPENDENT_AMBULATORY_CARE_PROVIDER_SITE_OTHER): Payer: Medicare Other | Admitting: Radiology

## 2013-07-16 ENCOUNTER — Encounter: Payer: Self-pay | Admitting: *Deleted

## 2013-07-16 DIAGNOSIS — R55 Syncope and collapse: Secondary | ICD-10-CM

## 2013-07-16 DIAGNOSIS — I4891 Unspecified atrial fibrillation: Secondary | ICD-10-CM

## 2013-07-16 DIAGNOSIS — I495 Sick sinus syndrome: Secondary | ICD-10-CM

## 2013-07-16 DIAGNOSIS — R001 Bradycardia, unspecified: Secondary | ICD-10-CM

## 2013-07-16 NOTE — Progress Notes (Signed)
Patient ID: [name], male   DOB: 09/13/1918, 80 y.o.   MRN: [id number] [employer] 30 day cardiac event monitor applied to patient.

## 2013-07-16 NOTE — Progress Notes (Signed)
Patient ID: [name], male   DOB: April 15, 1918, 80 y.o.   MRN: [id number] [employer] 30 day cardiac event monitor applied to patient.

## 2013-07-25 ENCOUNTER — Ambulatory Visit (INDEPENDENT_AMBULATORY_CARE_PROVIDER_SITE_OTHER): Payer: Medicare Other | Admitting: Pharmacist

## 2013-07-25 DIAGNOSIS — I4891 Unspecified atrial fibrillation: Secondary | ICD-10-CM

## 2013-07-25 DIAGNOSIS — Z5181 Encounter for therapeutic drug level monitoring: Secondary | ICD-10-CM

## 2013-07-25 LAB — POCT INR: INR: 1.9

## 2013-08-15 ENCOUNTER — Ambulatory Visit (INDEPENDENT_AMBULATORY_CARE_PROVIDER_SITE_OTHER): Payer: Medicare Other

## 2013-08-15 DIAGNOSIS — I4891 Unspecified atrial fibrillation: Secondary | ICD-10-CM

## 2013-08-15 DIAGNOSIS — Z5181 Encounter for therapeutic drug level monitoring: Secondary | ICD-10-CM

## 2013-08-15 LAB — POCT INR: INR: 2

## 2013-08-18 ENCOUNTER — Ambulatory Visit: Payer: Medicare Other | Admitting: Cardiology

## 2013-09-01 ENCOUNTER — Telehealth: Payer: Self-pay | Admitting: *Deleted

## 2013-09-01 NOTE — Telephone Encounter (Signed)
Dr [name] reviewed monitor done 07/16/13:  Parox AFib. No cause for syncope. No adverse pauses/ brady.  LMTCB for pt.

## 2013-09-02 NOTE — Telephone Encounter (Signed)
Pt.notified

## 2013-09-11 ENCOUNTER — Ambulatory Visit: Payer: Medicare Other | Admitting: Cardiology

## 2013-09-16 ENCOUNTER — Ambulatory Visit (INDEPENDENT_AMBULATORY_CARE_PROVIDER_SITE_OTHER): Payer: Medicare Other | Admitting: Cardiology

## 2013-09-16 ENCOUNTER — Ambulatory Visit (INDEPENDENT_AMBULATORY_CARE_PROVIDER_SITE_OTHER): Payer: Medicare Other | Admitting: *Deleted

## 2013-09-16 ENCOUNTER — Encounter: Payer: Self-pay | Admitting: Cardiology

## 2013-09-16 VITALS — BP 110/50 | HR 58 | Ht 68.0 in | Wt 180.0 lb

## 2013-09-16 DIAGNOSIS — I5032 Chronic diastolic (congestive) heart failure: Secondary | ICD-10-CM

## 2013-09-16 DIAGNOSIS — I480 Paroxysmal atrial fibrillation: Secondary | ICD-10-CM

## 2013-09-16 DIAGNOSIS — Z7901 Long term (current) use of anticoagulants: Secondary | ICD-10-CM

## 2013-09-16 DIAGNOSIS — Z5181 Encounter for therapeutic drug level monitoring: Secondary | ICD-10-CM

## 2013-09-16 DIAGNOSIS — I4891 Unspecified atrial fibrillation: Secondary | ICD-10-CM

## 2013-09-16 DIAGNOSIS — I493 Ventricular premature depolarization: Secondary | ICD-10-CM

## 2013-09-16 DIAGNOSIS — I4949 Other premature depolarization: Secondary | ICD-10-CM

## 2013-09-16 DIAGNOSIS — R55 Syncope and collapse: Secondary | ICD-10-CM

## 2013-09-16 LAB — POCT INR: INR: 1.7

## 2013-09-16 NOTE — Patient Instructions (Signed)
The current medical regimen is effective;  continue present plan and medications.  Follow up in 6 months with Dr. [name].  You will receive a letter in the mail 2 months before you are due.  Please call us when you receive this letter to schedule your follow up appointment.  

## 2013-09-16 NOTE — Progress Notes (Addendum)
[address] [place], [place]  [address] Phone: [phone] Fax:  [phone]  Date:  09/16/2013   ID:  Jordan BeatRalph B [name], DOB 04-30-1918, MRN [id number]  PCP:  [name],[name], MD   History of Present Illness: Jordan [name] is a 80 y.o. male with paroxysmal atrial fibrillation with hospitalization on 12/16/12 with diastolic heart failure, very frequent PVCs, ventricular bigeminy.  PVCs 28,000 and on 24-hour Holter monitor 8/12, normal ejection fraction with chronic dyspnea, cough, wheeze.   He saw Tereso NewcomerScott Weaver on 07/14/13 after hospitalization 4/30 through 5/1 after a witnessed syncopal episode. He had sensation of lightheadedness prior to the episode. Heart rate was reportedly in the 30s and his atenolol was stopped. His heart rate improved to greater than 16 was discharged home.  He was sitting at church and suddenly felt lightheaded, next recollection was rescue workers helping him out. He had another episode of syncope on the way to the hospital. Has felt well since stopping beta blocker.  In February of 2015 broke his hip and he works with a walker.  In the past when he was first diagnosed with paroxysmal atrial fibrillation, Dr. Johney FrameAllred saw him in consultation and recommended flecainide, low-dose to see if this would help with both his atrial fibrillation as well as PVC burden. I was the given his recent bradycardia, we will be avoiding.    Wt Readings from Last 3 Encounters:  09/16/13 180 lb (81.647 kg)  07/14/13 176 lb 12.8 oz (80.196 kg)  07/11/13 175 lb 9.6 oz (79.652 kg)     Past Medical History  Diagnosis Date  . GERD (gastroesophageal reflux disease)   . Hypertension   . COPD (chronic obstructive pulmonary disease)   . Chronic kidney disease   . Ventricular bigeminy   . PVC (premature ventricular contraction)   . Dyspnea   . Thyroid disease   . CHF (congestive heart failure)     Past Surgical History  Procedure Laterality Date  .  Cholecystectomy    . Splenectomy, partial    . Esophagogastroduodenoscopy N/A 04/26/2013    Procedure: ESOPHAGOGASTRODUODENOSCOPY (EGD);  Surgeon: Florencia Reasonsobert V Buccini, MD;  [facility];  Service: Endoscopy;  Laterality: N/A;  . Esophagogastroduodenoscopy (egd) with esophageal dilation N/A 04/26/2013    Procedure: ESOPHAGOGASTRODUODENOSCOPY (EGD) WITH ESOPHAGEAL DILATION;  Surgeon: Florencia Reasonsobert V Buccini, MD;  Location: [facility] ENDOSCOPY;  Service: Endoscopy;  Laterality: N/A;  . Hip arthroplasty Right 04/25/2013    Procedure: ARTHROPLASTY BIPOLAR HIP;  Surgeon: Nestor LewandowskyFrank J Rowan, MD;  Location: [facility] OR;  Service: Orthopedics;  Laterality: Right;    Current Outpatient Prescriptions  Medication Sig Dispense Refill  . acetaminophen (TYLENOL) 500 MG tablet Take 1,000 mg by mouth every 8 (eight) hours as needed for mild pain.      Marland Kitchen. albuterol (PROVENTIL HFA;VENTOLIN HFA) 108 (90 BASE) MCG/ACT inhaler Inhale 2 puffs into the lungs every 6 (six) hours as needed for wheezing.      . furosemide (LASIX) 40 MG tablet Take 40 mg by mouth daily as needed.      . latanoprost (XALATAN) 0.005 % ophthalmic solution Place 1 drop into both eyes at bedtime. For glaucoma      . levothyroxine (SYNTHROID, LEVOTHROID) 100 MCG tablet Take 100 mcg by mouth daily before breakfast. For hypothyroidism      . omeprazole (PRILOSEC) 20 MG capsule Take 20 mg by mouth daily. For GERD      . warfarin (COUMADIN) 2.5  MG tablet Take 2.5-3 mg by mouth See admin instructions. Take 2.5 mg every day, except on mondays and fridays take 3 mg.       No current facility-administered medications for this visit.    Allergies:    Allergies  Allergen Reactions  . Diltiazem Hcl Er Beads Other (See Comments)    Tachycardia  . Metoprolol Succinate Er [Metoprolol Tartrate] Other (See Comments)    hallucinations    Social History:  The patient  reports that he has never smoked. He does not have any smokeless tobacco history on file. He reports that  he does not drink alcohol or use illicit drugs.   ROS:  Please see the history of present illness.   Chronic diarrhea. Positive for dyspnea. No chest pain. No syncope. Fatigue. No bleeding.   All other systems reviewed and negative.   PHYSICAL EXAM: VS:  BP 110/50  Pulse 58  Ht 5\' 8"  (1.727 m)  Wt 180 lb (81.647 kg)  BMI 27.38 kg/m2 Elderly Well nourished, well developed, in no acute distress HEENT: normal Neck: no JVD Cardiac:  normal S1, S2; RRR; rare ectopy, no murmur Lungs:  clear to auscultation bilaterally, occasional scattered wheezing, no rhonchi or rales Abd: soft, nontender, no hepatomegaly Ext: Left leg edema.  Skin: warm and dry Neuro: no focal abnormalities noted  EKG:  As recent hospital EKG with sinus rhythm and PVCs. No further atrial fibrillation.    Event monitor-: 07/18/13 episode of atrial fibrillation with heart rate of 130 at 7:40 AM, transient. This is while being off of flecainide and atenolol.  ASSESSMENT AND PLAN:  1. Syncope-hospitalization 5/15-discontinuation of atenolol, flecainide. He thinks he may have just gone to sleep. With his heart rate of 30, I'm fine without medications as above. Hopefully he will not have any further episodes of A. fib with RVR. In fact, since stopping his medications, he has more energy, more alert, less sleeping. 2. Paroxysmal atrial fibrillation-currently on warfarin as anticoagulation. Cost was an issue. Previously was on flecainide but this was stopped after bradycardia. Also atenolol was stopped as well.  I am willing to tolerate an occasional episode of A. fib with RVR given his previous bradycardia. Continue to monitor.  3. Chronic anticoagulation-cost of Eliquis was too high. He is now back on warfarin. 4. Chronic diastolic heart failure-originally in hospital he appear to be intravascular volume depleted with an increased creatinine. Avoiding NSAIDs  I do not wish to over diurese him with increasing Lasix. Continue with  current treatment strategy. Leg edema is chronic. 5. COPD-continue with inhalers. This also can be playing a role. Wheezing appreciated on exam at times. He smoked for 50 years. 6. Diarrhea-continue to address with Dr. Jeannetta NapElkins. I agree with stopping milk products. This has not had a temporal association with any new medications. His wife assures me. 7. Six-month followup  Signed, Donato SchultzMark Kresta Templeman, MD [facility]  09/16/2013 11:19 AM

## 2013-09-29 ENCOUNTER — Telehealth: Payer: Self-pay | Admitting: Pharmacist

## 2013-09-29 NOTE — Telephone Encounter (Signed)
Dr. [name] has given the okay to hold warfarin 5 days prior to eye surgery with Dr. Dione BoozeGroat, and to resume warfarin evening of procedure.  Will give patient thorough instruction once the date of surgery is known.

## 2013-10-01 ENCOUNTER — Ambulatory Visit (INDEPENDENT_AMBULATORY_CARE_PROVIDER_SITE_OTHER): Payer: Medicare Other | Admitting: Pharmacist

## 2013-10-01 DIAGNOSIS — Z5181 Encounter for therapeutic drug level monitoring: Secondary | ICD-10-CM

## 2013-10-01 DIAGNOSIS — I4891 Unspecified atrial fibrillation: Secondary | ICD-10-CM

## 2013-10-01 LAB — POCT INR: INR: 2

## 2013-10-22 ENCOUNTER — Ambulatory Visit (INDEPENDENT_AMBULATORY_CARE_PROVIDER_SITE_OTHER): Payer: Medicare Other | Admitting: *Deleted

## 2013-10-22 DIAGNOSIS — I4891 Unspecified atrial fibrillation: Secondary | ICD-10-CM

## 2013-10-22 DIAGNOSIS — Z5181 Encounter for therapeutic drug level monitoring: Secondary | ICD-10-CM

## 2013-10-22 LAB — POCT INR: INR: 2

## 2013-11-19 ENCOUNTER — Ambulatory Visit (INDEPENDENT_AMBULATORY_CARE_PROVIDER_SITE_OTHER): Payer: Medicare Other | Admitting: *Deleted

## 2013-11-19 DIAGNOSIS — I4891 Unspecified atrial fibrillation: Secondary | ICD-10-CM

## 2013-11-19 DIAGNOSIS — Z5181 Encounter for therapeutic drug level monitoring: Secondary | ICD-10-CM

## 2013-11-19 LAB — POCT INR: INR: 1.9

## 2013-11-19 MED ORDER — WARFARIN SODIUM 2.5 MG PO TABS: ORAL_TABLET | ORAL | Status: DC

## 2013-12-03 ENCOUNTER — Ambulatory Visit (INDEPENDENT_AMBULATORY_CARE_PROVIDER_SITE_OTHER): Payer: Medicare Other | Admitting: *Deleted

## 2013-12-03 DIAGNOSIS — I4891 Unspecified atrial fibrillation: Secondary | ICD-10-CM

## 2013-12-03 DIAGNOSIS — Z5181 Encounter for therapeutic drug level monitoring: Secondary | ICD-10-CM

## 2013-12-03 LAB — POCT INR: INR: 3.1

## 2013-12-18 ENCOUNTER — Ambulatory Visit (INDEPENDENT_AMBULATORY_CARE_PROVIDER_SITE_OTHER): Payer: Medicare Other

## 2013-12-18 DIAGNOSIS — I4891 Unspecified atrial fibrillation: Secondary | ICD-10-CM

## 2013-12-18 DIAGNOSIS — Z5181 Encounter for therapeutic drug level monitoring: Secondary | ICD-10-CM

## 2013-12-18 LAB — POCT INR: INR: 2.6

## 2014-01-08 ENCOUNTER — Ambulatory Visit (INDEPENDENT_AMBULATORY_CARE_PROVIDER_SITE_OTHER): Payer: Medicare Other | Admitting: *Deleted

## 2014-01-08 DIAGNOSIS — Z5181 Encounter for therapeutic drug level monitoring: Secondary | ICD-10-CM

## 2014-01-08 DIAGNOSIS — I4891 Unspecified atrial fibrillation: Secondary | ICD-10-CM

## 2014-01-08 LAB — POCT INR: INR: 3

## 2014-01-22 ENCOUNTER — Telehealth: Payer: Self-pay | Admitting: Cardiology

## 2014-01-22 NOTE — Telephone Encounter (Signed)
New message  Pt daughter called state that the pt is on new cold medication amoxicillin 500 mg and Prometh/ codeine 6.25 -10 mg

## 2014-01-23 NOTE — Telephone Encounter (Signed)
Spoke with daughter and verified meds, informed her that these meds do not interact with Coumadin. But be mindful that the Cough syrup will cause drowsiness.

## 2014-02-03 ENCOUNTER — Ambulatory Visit (INDEPENDENT_AMBULATORY_CARE_PROVIDER_SITE_OTHER): Payer: Medicare Other

## 2014-02-03 DIAGNOSIS — Z5181 Encounter for therapeutic drug level monitoring: Secondary | ICD-10-CM

## 2014-02-03 DIAGNOSIS — I4891 Unspecified atrial fibrillation: Secondary | ICD-10-CM

## 2014-02-03 LAB — POCT INR: INR: 2.7

## 2014-03-10 ENCOUNTER — Ambulatory Visit (INDEPENDENT_AMBULATORY_CARE_PROVIDER_SITE_OTHER): Payer: Medicare Other | Admitting: *Deleted

## 2014-03-10 DIAGNOSIS — Z5181 Encounter for therapeutic drug level monitoring: Secondary | ICD-10-CM

## 2014-03-10 DIAGNOSIS — I4891 Unspecified atrial fibrillation: Secondary | ICD-10-CM

## 2014-03-10 LAB — POCT INR: INR: 2.7

## 2014-03-31 ENCOUNTER — Ambulatory Visit: Payer: Medicare Other | Admitting: Cardiology

## 2014-04-08 ENCOUNTER — Ambulatory Visit (INDEPENDENT_AMBULATORY_CARE_PROVIDER_SITE_OTHER): Payer: Medicare Other | Admitting: *Deleted

## 2014-04-08 ENCOUNTER — Ambulatory Visit (INDEPENDENT_AMBULATORY_CARE_PROVIDER_SITE_OTHER): Payer: Medicare Other | Admitting: Cardiology

## 2014-04-08 ENCOUNTER — Encounter: Payer: Self-pay | Admitting: Cardiology

## 2014-04-08 VITALS — BP 138/84 | HR 76 | Ht 68.0 in | Wt 180.0 lb

## 2014-04-08 DIAGNOSIS — I4891 Unspecified atrial fibrillation: Secondary | ICD-10-CM

## 2014-04-08 DIAGNOSIS — I493 Ventricular premature depolarization: Secondary | ICD-10-CM

## 2014-04-08 DIAGNOSIS — I10 Essential (primary) hypertension: Secondary | ICD-10-CM

## 2014-04-08 DIAGNOSIS — I480 Paroxysmal atrial fibrillation: Secondary | ICD-10-CM

## 2014-04-08 DIAGNOSIS — Z5181 Encounter for therapeutic drug level monitoring: Secondary | ICD-10-CM

## 2014-04-08 LAB — POCT INR: INR: 2.9

## 2014-04-08 NOTE — Progress Notes (Signed)
[address] [place], [place]  [address] Phone: [phone] Fax:  [phone]  Date:  04/08/2014   ID:  Jordan [name], DOB October 12, 1918, MRN [id number]  PCP:  Kaleen Mask, MD   History of Present Illness: Jordan [name] is a 81 y.o. male with paroxysmal atrial fibrillation with hospitalization on 12/16/12 with diastolic heart failure, very frequent PVCs, ventricular bigeminy with symptomatic bradycardia, had to stop flecainide and atenolol.  PVCs 28,000 and on 24-hour Holter monitor 8/12, normal ejection fraction with chronic dyspnea, cough, wheeze.   He saw Tereso Newcomer on 07/14/13 after hospitalization 4/30 through 5/1 after a witnessed syncopal episode. He had sensation of lightheadedness prior to the episode. Heart rate was reportedly in the 30s and his atenolol was stopped. His heart rate improved to greater than 16 was discharged home.  He was sitting at church and suddenly felt lightheaded, next recollection was rescue workers helping him out. He had another episode of syncope on the way to the hospital. Has felt well since stopping beta blocker.  In February of 2015 broke his hip and he works with a walker.  In the past when he was first diagnosed with paroxysmal atrial fibrillation, Dr. Johney Frame saw him in consultation and recommended flecainide, low-dose to see if this would help with both his atrial fibrillation as well as PVC burden. IGiven his recent bradycardia, we will be avoiding.   Overall other than shortness of breath with minimal exertion, he is doing well.   Wt Readings from Last 3 Encounters:  04/08/14 180 lb (81.647 kg)  09/16/13 180 lb (81.647 kg)  07/14/13 176 lb 12.8 oz (80.196 kg)     Past Medical History  Diagnosis Date  . GERD (gastroesophageal reflux disease)   . Hypertension   . COPD (chronic obstructive pulmonary disease)   . Chronic kidney disease   . Ventricular bigeminy   . PVC (premature ventricular contraction)   .  Dyspnea   . Thyroid disease   . CHF (congestive heart failure)     Past Surgical History  Procedure Laterality Date  . Cholecystectomy    . Splenectomy, partial    . Esophagogastroduodenoscopy N/A 04/26/2013    Procedure: ESOPHAGOGASTRODUODENOSCOPY (EGD);  Surgeon: Florencia Reasons, MD;  [facility] ENDOSCOPY;  Service: Endoscopy;  Laterality: N/A;  . Esophagogastroduodenoscopy (egd) with esophageal dilation N/A 04/26/2013    Procedure: ESOPHAGOGASTRODUODENOSCOPY (EGD) WITH ESOPHAGEAL DILATION;  Surgeon: Florencia Reasons, MD;  Location: [facility];  Service: Endoscopy;  Laterality: N/A;  . Hip arthroplasty Right 04/25/2013    Procedure: ARTHROPLASTY BIPOLAR HIP;  Surgeon: Nestor Lewandowsky, MD;  Location: [facility] OR;  Service: Orthopedics;  Laterality: Right;    Current Outpatient Prescriptions  Medication Sig Dispense Refill  . acetaminophen (TYLENOL) 500 MG tablet Take 1,000 mg by mouth every 8 (eight) hours as needed for mild pain.    Marland Kitchen albuterol (PROVENTIL HFA;VENTOLIN HFA) 108 (90 BASE) MCG/ACT inhaler Inhale 2 puffs into the lungs every 6 (six) hours as needed for wheezing.    . furosemide (LASIX) 40 MG tablet Take 40 mg by mouth daily as needed.    . latanoprost (XALATAN) 0.005 % ophthalmic solution Place 1 drop into both eyes at bedtime. For glaucoma    . levothyroxine (SYNTHROID, LEVOTHROID) 100 MCG tablet Take 100 mcg by mouth daily before breakfast. For hypothyroidism    . omeprazole (PRILOSEC) 20 MG capsule Take 20 mg by mouth daily. For GERD    .  warfarin (COUMADIN) 2.5 MG tablet Take as directed by coumadin clinic 40 tablet 3   No current facility-administered medications for this visit.    Allergies:    Allergies  Allergen Reactions  . Diltiazem Hcl Er Beads Other (See Comments)    Tachycardia  . Metoprolol Succinate Er [Metoprolol Tartrate] Other (See Comments)    hallucinations    Social History:  The patient  reports that he has never smoked. He does not have any  smokeless tobacco history on file. He reports that he does not drink alcohol or use illicit drugs.   ROS:  Please see the history of present illness.   Chronic diarrhea. Positive for dyspnea. No chest pain. No syncope. Fatigue. No bleeding.   All other systems reviewed and negative.   PHYSICAL EXAM: VS:  BP 138/84 mmHg  Pulse 76  Ht 5\' 8"  (1.727 m)  Wt 180 lb (81.647 kg)  BMI 27.38 kg/m2 Elderly Well nourished, well developed, in no acute distress HEENT: normal Neck: no JVD Cardiac:  normal S1, S2; RRR; rare ectopy, no murmur Lungs:  clear to auscultation bilaterally, occasional scattered wheezing, no rhonchi or rales Abd: soft, nontender, no hepatomegaly Ext: Left leg edema, ankle tumor.  Skin: warm and dry Neuro: no focal abnormalities noted  EKG:  As recent hospital EKG with sinus rhythm and PVCs. No further atrial fibrillation.     Event monitor-: 07/18/13 episode of atrial fibrillation with heart rate of 130 at 7:40 AM, transient. This is while being off of flecainide and atenolol.  ASSESSMENT AND PLAN:  1. Syncope-hospitalization 5/15-discontinuation of atenolol, flecainide. Doing well. He thinks he may have just gone to sleep. With his heart rate of 30, I'm fine without medications as above. Hopefully he will not have any further episodes of A. fib with RVR. In fact, since stopping his medications, he has more energy, more alert, less sleeping. 2. Paroxysmal atrial fibrillation-currently on warfarin as anticoagulation. Cost was an issue. Previously was on flecainide but this was stopped after bradycardia. Also atenolol was stopped as well.  I am willing to tolerate an occasional episode of A. fib with RVR given his previous bradycardia. Continue to monitor.  3. Chronic anticoagulation-cost of Eliquis was too high. He is now back on warfarin. 4. Chronic diastolic heart failure-originally in hospital he appear to be intravascular volume depleted with an increased creatinine. Avoiding  NSAIDs  I do not wish to over diurese him with increasing Lasix. Continue with current treatment strategy. Leg edema is chronic. 5. COPD-continue with inhalers. This also can be playing a role.  He smoked for 50 years. Shortness of breath 6. Diarrhea-improved. I agree with stopping milk products. This has not had a temporal association with any new medications. His wife assures me. 7. Six-month followup  Signed, Donato SchultzMark Brayley Mackowiak, MD [facility]  04/08/2014 1:57 PM

## 2014-04-08 NOTE — Patient Instructions (Signed)
Your physician recommends that you continue on your current medications as directed. Please refer to the Current Medication list given to you today.  Your physician wants you to follow-up in: 6 months with Dr. [name]. You will receive a reminder letter in the mail two months in advance. If you don't receive a letter, please call our office to schedule the follow-up appointment.  

## 2014-04-20 ENCOUNTER — Other Ambulatory Visit: Payer: Self-pay | Admitting: Cardiology

## 2014-05-20 ENCOUNTER — Ambulatory Visit (INDEPENDENT_AMBULATORY_CARE_PROVIDER_SITE_OTHER): Payer: Medicare Other | Admitting: *Deleted

## 2014-05-20 DIAGNOSIS — I4891 Unspecified atrial fibrillation: Secondary | ICD-10-CM

## 2014-05-20 DIAGNOSIS — Z5181 Encounter for therapeutic drug level monitoring: Secondary | ICD-10-CM

## 2014-05-20 DIAGNOSIS — I480 Paroxysmal atrial fibrillation: Secondary | ICD-10-CM

## 2014-05-20 LAB — POCT INR: INR: 2.4

## 2014-07-01 ENCOUNTER — Ambulatory Visit (INDEPENDENT_AMBULATORY_CARE_PROVIDER_SITE_OTHER): Payer: Medicare Other | Admitting: Pharmacist

## 2014-07-01 DIAGNOSIS — Z5181 Encounter for therapeutic drug level monitoring: Secondary | ICD-10-CM | POA: Diagnosis not present

## 2014-07-01 DIAGNOSIS — I480 Paroxysmal atrial fibrillation: Secondary | ICD-10-CM | POA: Diagnosis not present

## 2014-07-01 DIAGNOSIS — I4891 Unspecified atrial fibrillation: Secondary | ICD-10-CM | POA: Diagnosis not present

## 2014-07-01 LAB — POCT INR: INR: 2.5

## 2014-08-12 ENCOUNTER — Ambulatory Visit (INDEPENDENT_AMBULATORY_CARE_PROVIDER_SITE_OTHER): Payer: Medicare Other | Admitting: *Deleted

## 2014-08-12 DIAGNOSIS — I480 Paroxysmal atrial fibrillation: Secondary | ICD-10-CM | POA: Diagnosis not present

## 2014-08-12 DIAGNOSIS — I4891 Unspecified atrial fibrillation: Secondary | ICD-10-CM | POA: Diagnosis not present

## 2014-08-12 DIAGNOSIS — Z5181 Encounter for therapeutic drug level monitoring: Secondary | ICD-10-CM | POA: Diagnosis not present

## 2014-08-12 LAB — POCT INR: INR: 2.6

## 2014-08-24 ENCOUNTER — Other Ambulatory Visit: Payer: Self-pay | Admitting: Cardiology

## 2014-09-23 ENCOUNTER — Ambulatory Visit (INDEPENDENT_AMBULATORY_CARE_PROVIDER_SITE_OTHER): Payer: Medicare Other | Admitting: *Deleted

## 2014-09-23 DIAGNOSIS — I4891 Unspecified atrial fibrillation: Secondary | ICD-10-CM | POA: Diagnosis not present

## 2014-09-23 DIAGNOSIS — Z5181 Encounter for therapeutic drug level monitoring: Secondary | ICD-10-CM | POA: Diagnosis not present

## 2014-09-23 DIAGNOSIS — I480 Paroxysmal atrial fibrillation: Secondary | ICD-10-CM

## 2014-09-23 LAB — POCT INR: INR: 2.1

## 2014-10-12 ENCOUNTER — Other Ambulatory Visit: Payer: Self-pay | Admitting: Family Medicine

## 2014-10-12 DIAGNOSIS — R4702 Dysphasia: Secondary | ICD-10-CM

## 2014-10-16 ENCOUNTER — Ambulatory Visit
Admission: RE | Admit: 2014-10-16 | Discharge: 2014-10-16 | Disposition: A | Discharge status: Home / Self Care | Payer: Medicare Other | Source: Ambulatory Visit | Attending: Family Medicine | Admitting: Family Medicine

## 2014-10-16 DIAGNOSIS — R4702 Dysphasia: Secondary | ICD-10-CM

## 2014-10-27 ENCOUNTER — Encounter: Payer: Self-pay | Admitting: Cardiology

## 2014-10-27 ENCOUNTER — Ambulatory Visit (INDEPENDENT_AMBULATORY_CARE_PROVIDER_SITE_OTHER): Payer: Medicare Other | Admitting: *Deleted

## 2014-10-27 ENCOUNTER — Ambulatory Visit (INDEPENDENT_AMBULATORY_CARE_PROVIDER_SITE_OTHER): Payer: Medicare Other | Admitting: Cardiology

## 2014-10-27 VITALS — BP 130/68 | HR 73 | Ht 68.0 in | Wt 184.8 lb

## 2014-10-27 DIAGNOSIS — I480 Paroxysmal atrial fibrillation: Secondary | ICD-10-CM

## 2014-10-27 DIAGNOSIS — I10 Essential (primary) hypertension: Secondary | ICD-10-CM

## 2014-10-27 DIAGNOSIS — Z7901 Long term (current) use of anticoagulants: Secondary | ICD-10-CM

## 2014-10-27 DIAGNOSIS — Z5181 Encounter for therapeutic drug level monitoring: Secondary | ICD-10-CM

## 2014-10-27 DIAGNOSIS — I4891 Unspecified atrial fibrillation: Secondary | ICD-10-CM | POA: Diagnosis not present

## 2014-10-27 DIAGNOSIS — I493 Ventricular premature depolarization: Secondary | ICD-10-CM | POA: Diagnosis not present

## 2014-10-27 LAB — POCT INR: INR: 2.1

## 2014-10-27 NOTE — Patient Instructions (Signed)
Medication Instructions:  Your physician recommends that you continue on your current medications as directed. Please refer to the Current Medication list given to you today.  Follow-Up: Follow up in 6 months with Dr. [name].  You will receive a letter in the mail 2 months before you are due.  Please call us when you receive this letter to schedule your follow up appointment.  Thank you for choosing [facility]!!     

## 2014-10-27 NOTE — Progress Notes (Signed)
[address] [place], [place]  [address] Phone: [phone] Fax:  [phone]  Date:  10/27/2014   ID:  REINHARDT LICAUSI, DOB [birth year], MRN [id number]  PCP:  Kaleen Mask, MD   History of Present Illness: Jordan [name] is a 81 y.o. male with paroxysmal atrial fibrillation with hospitalization on 12/16/12 with diastolic heart failure, very frequent PVCs, ventricular bigeminy with symptomatic bradycardia, had to stop flecainide and atenolol.  PVCs 28,000 and on 24-hour Holter monitor 8/12, normal ejection fraction with chronic dyspnea, cough, wheeze.   He saw Tereso Newcomer on 07/14/13 after hospitalization 4/30 through 5/1 after a witnessed syncopal episode. He had sensation of lightheadedness prior to the episode. Heart rate was reportedly in the 30s and his atenolol was stopped. His heart rate improved to greater than 16 was discharged home.  He was sitting at church and suddenly felt lightheaded, next recollection was rescue workers helping him out. He had another episode of syncope on the way to the hospital. Has felt well since stopping beta blocker.  In February of 2015 broke his hip and he works with a walker.  In the past when he was first diagnosed with paroxysmal atrial fibrillation, Dr. Johney Frame saw him in consultation and recommended flecainide, low-dose to see if this would help with both his atrial fibrillation as well as PVC burden, however he did not tolerate these medications because of his significant bradycardia. Given his prior bradycardia, we will be avoiding.   Overall other than shortness of breath with minimal exertion, he is doing well.   Wt Readings from Last 3 Encounters:  10/27/14 184 lb 12.8 oz (83.825 kg)  04/08/14 180 lb (81.647 kg)  09/16/13 180 lb (81.647 kg)     Past Medical History  Diagnosis Date  . GERD (gastroesophageal reflux disease)   . Hypertension   . COPD (chronic obstructive pulmonary disease)   . Chronic kidney  disease   . Ventricular bigeminy   . PVC (premature ventricular contraction)   . Dyspnea   . Thyroid disease   . CHF (congestive heart failure)     Past Surgical History  Procedure Laterality Date  . Cholecystectomy    . Splenectomy, partial    . Esophagogastroduodenoscopy N/A 04/26/2013    Procedure: ESOPHAGOGASTRODUODENOSCOPY (EGD);  Surgeon: Florencia Reasons, MD;  Location[facility] ENDOSCOPY;  Service: Endoscopy;  Laterality: N/A;  . Esophagogastroduodenoscopy (egd) with esophageal dilation N/A 04/26/2013    Procedure: ESOPHAGOGASTRODUODENOSCOPY (EGD) WITH ESOPHAGEAL DILATION;  Surgeon: Florencia Reasons, MD;  Location: [facility] ENDOS[facility];  Service: Endoscopy;  Laterality: N/A;  . Hip arthroplasty Right 04/25/2013    Procedure: ARTHROPLASTY BIPOLAR HIP;  Surgeon: Nestor Lewandowsky, MD;  Location: [facility] OR;  Service: Orthopedics;  Laterality: Right;    Current Outpatient Prescriptions  Medication Sig Dispense Refill  . acetaminophen (TYLENOL) 500 MG tablet Take 1,000 mg by mouth every 8 (eight) hours as needed for mild pain.    Marland Kitchen albuterol (PROVENTIL HFA;VENTOLIN HFA) 108 (90 BASE) MCG/ACT inhaler Inhale 2 puffs into the lungs every 6 (six) hours as needed for wheezing.    . furosemide (LASIX) 40 MG tablet Take 40 mg by mouth daily as needed.    . latanoprost (XALATAN) 0.005 % ophthalmic solution Place 1 drop into both eyes at bedtime. For glaucoma    . levothyroxine (SYNTHROID, LEVOTHROID) 100 MCG tablet Take 100 mcg by mouth daily before breakfast. For hypothyroidism    . omeprazole (PRILOSEC)  20 MG capsule Take 20 mg by mouth daily. For GERD    . warfarin (COUMADIN) 2.5 MG tablet AS DIRECTED 40 tablet 3   No current facility-administered medications for this visit.    Allergies:    Allergies  Allergen Reactions  . Diltiazem Hcl Er Beads Other (See Comments)    Tachycardia  . Metoprolol Succinate Er [Metoprolol Tartrate] Other (See Comments)    hallucinations    Social History:  The patient   reports that he has never smoked. He does not have any smokeless tobacco history on file. He reports that he does not drink alcohol or use illicit drugs.   ROS:  Please see the history of present illness.   Chronic diarrhea. Positive for dyspnea. No chest pain. No syncope. Fatigue. No bleeding.   All other systems reviewed and negative.   PHYSICAL EXAM: VS:  BP 130/68 mmHg  Pulse 73  Ht  (1.727 m)  Wt 184 lb 12.8 oz (83.825 kg)  BMI 28.11 kg/m2 Elderly Well nourished, well developed, in no acute distress HEENT: normal Neck: no JVD Cardiac:  normal S1, S2; RRR; rare ectopy, no murmur Lungs:  clear to auscultation bilaterally, occasional scattered wheezing, no rhonchi or rales Abd: soft, nontender, no hepatomegaly Ext: Left leg edema, ankle tumor.  Skin: warm and dry Neuro: no focal abnormalities noted  EKG:  Today's EKG-10/29/14-sinus rhythm, 73, PVC noted As recent hospital EKG with sinus rhythm and PVCs. No further atrial fibrillation.     Event monitor-: 07/18/13 episode of atrial fibrillation with heart rate of 130 at 7:40 AM, transient. This is while being off of flecainide and atenolol.  ASSESSMENT AND PLAN:  1. Syncope-hospitalization 5/15-discontinuation of atenolol, flecainide. Doing well. He thinks he may have just gone to sleep. With his heart rate of 30, I'm fine without medications as above. Hopefully he will not have any further episodes of A. fib with RVR. In fact, since stopping his medications, he has more energy, more alert, less sleeping. 2. Paroxysmal atrial fibrillation-currently on warfarin as anticoagulation. Cost was an issue. Previously was on flecainide but this was stopped after bradycardia. Also atenolol was stopped as well.  I am willing to tolerate an occasional episode of A. fib with RVR given his previous bradycardia. Continue to monitor.  3. Chronic anticoagulation-cost of Eliquis was too high. He is now back on warfarin. 4. Chronic diastolic heart  failure-originally in hospital he appear to be intravascular volume depleted with an increased creatinine. Avoiding NSAIDs  I do not wish to over diurese him with increasing Lasix. Continue with current treatment strategy. Leg edema is chronic. 5. COPD-continue with inhalers. This also can be playing a role.  He smoked for 50 years. Shortness of breath 6. Diarrhea-at times he still is having difficulty with this.This has not had a temporal association with any new medications. His wife assures me. 7. Six-month followup  Signed, Donato Schultz, MD [facility]  10/27/2014 2:17 PM

## 2014-12-08 ENCOUNTER — Ambulatory Visit (INDEPENDENT_AMBULATORY_CARE_PROVIDER_SITE_OTHER): Payer: Medicare Other

## 2014-12-08 DIAGNOSIS — Z5181 Encounter for therapeutic drug level monitoring: Secondary | ICD-10-CM | POA: Diagnosis not present

## 2014-12-08 DIAGNOSIS — I4891 Unspecified atrial fibrillation: Secondary | ICD-10-CM

## 2014-12-08 DIAGNOSIS — I480 Paroxysmal atrial fibrillation: Secondary | ICD-10-CM | POA: Diagnosis not present

## 2014-12-08 LAB — POCT INR: INR: 2.4

## 2014-12-28 ENCOUNTER — Other Ambulatory Visit: Payer: Self-pay | Admitting: Cardiology

## 2015-01-19 ENCOUNTER — Ambulatory Visit (INDEPENDENT_AMBULATORY_CARE_PROVIDER_SITE_OTHER): Payer: Medicare Other | Admitting: *Deleted

## 2015-01-19 DIAGNOSIS — Z5181 Encounter for therapeutic drug level monitoring: Secondary | ICD-10-CM

## 2015-01-19 DIAGNOSIS — I480 Paroxysmal atrial fibrillation: Secondary | ICD-10-CM

## 2015-01-19 DIAGNOSIS — I4891 Unspecified atrial fibrillation: Secondary | ICD-10-CM | POA: Diagnosis not present

## 2015-01-19 LAB — POCT INR: INR: 2.3

## 2015-03-02 ENCOUNTER — Ambulatory Visit (INDEPENDENT_AMBULATORY_CARE_PROVIDER_SITE_OTHER): Payer: Medicare Other | Admitting: *Deleted

## 2015-03-02 DIAGNOSIS — I480 Paroxysmal atrial fibrillation: Secondary | ICD-10-CM

## 2015-03-02 DIAGNOSIS — I4891 Unspecified atrial fibrillation: Secondary | ICD-10-CM | POA: Diagnosis not present

## 2015-03-02 DIAGNOSIS — Z5181 Encounter for therapeutic drug level monitoring: Secondary | ICD-10-CM

## 2015-03-02 LAB — POCT INR: INR: 2.7

## 2015-04-13 ENCOUNTER — Ambulatory Visit (INDEPENDENT_AMBULATORY_CARE_PROVIDER_SITE_OTHER): Payer: Medicare Other | Admitting: *Deleted

## 2015-04-13 DIAGNOSIS — Z5181 Encounter for therapeutic drug level monitoring: Secondary | ICD-10-CM | POA: Diagnosis not present

## 2015-04-13 DIAGNOSIS — I4891 Unspecified atrial fibrillation: Secondary | ICD-10-CM

## 2015-04-13 DIAGNOSIS — I480 Paroxysmal atrial fibrillation: Secondary | ICD-10-CM | POA: Diagnosis not present

## 2015-04-13 LAB — POCT INR: INR: 2.5

## 2015-04-28 ENCOUNTER — Other Ambulatory Visit: Payer: Self-pay | Admitting: Cardiology

## 2015-05-06 ENCOUNTER — Encounter (HOSPITAL_COMMUNITY): Payer: Self-pay | Admitting: Emergency Medicine

## 2015-05-06 ENCOUNTER — Inpatient Hospital Stay (HOSPITAL_COMMUNITY)
Admission: EM | Admit: 2015-05-06 | Discharge: 2015-05-11 | DRG: 470 | Disposition: A | Discharge status: Skilled Nursing Facility | Payer: Medicare Other | Attending: Internal Medicine | Admitting: Internal Medicine

## 2015-05-06 ENCOUNTER — Emergency Department (HOSPITAL_COMMUNITY): Payer: Medicare Other

## 2015-05-06 DIAGNOSIS — N1831 Chronic kidney disease, stage 3a: Secondary | ICD-10-CM | POA: Diagnosis present

## 2015-05-06 DIAGNOSIS — N183 Chronic kidney disease, stage 3 (moderate): Secondary | ICD-10-CM | POA: Diagnosis present

## 2015-05-06 DIAGNOSIS — Z8249 Family history of ischemic heart disease and other diseases of the circulatory system: Secondary | ICD-10-CM

## 2015-05-06 DIAGNOSIS — Z9081 Acquired absence of spleen: Secondary | ICD-10-CM

## 2015-05-06 DIAGNOSIS — W19XXXA Unspecified fall, initial encounter: Secondary | ICD-10-CM

## 2015-05-06 DIAGNOSIS — N179 Acute kidney failure, unspecified: Secondary | ICD-10-CM | POA: Diagnosis not present

## 2015-05-06 DIAGNOSIS — D6832 Hemorrhagic disorder due to extrinsic circulating anticoagulants: Secondary | ICD-10-CM | POA: Diagnosis present

## 2015-05-06 DIAGNOSIS — J449 Chronic obstructive pulmonary disease, unspecified: Secondary | ICD-10-CM | POA: Diagnosis not present

## 2015-05-06 DIAGNOSIS — H9190 Unspecified hearing loss, unspecified ear: Secondary | ICD-10-CM | POA: Diagnosis present

## 2015-05-06 DIAGNOSIS — Y9222 Religious institution as the place of occurrence of the external cause: Secondary | ICD-10-CM

## 2015-05-06 DIAGNOSIS — D72829 Elevated white blood cell count, unspecified: Secondary | ICD-10-CM | POA: Diagnosis present

## 2015-05-06 DIAGNOSIS — J441 Chronic obstructive pulmonary disease with (acute) exacerbation: Secondary | ICD-10-CM | POA: Diagnosis not present

## 2015-05-06 DIAGNOSIS — Z888 Allergy status to other drugs, medicaments and biological substances status: Secondary | ICD-10-CM | POA: Diagnosis not present

## 2015-05-06 DIAGNOSIS — R0600 Dyspnea, unspecified: Secondary | ICD-10-CM

## 2015-05-06 DIAGNOSIS — K219 Gastro-esophageal reflux disease without esophagitis: Secondary | ICD-10-CM | POA: Diagnosis present

## 2015-05-06 DIAGNOSIS — S72009A Fracture of unspecified part of neck of unspecified femur, initial encounter for closed fracture: Secondary | ICD-10-CM | POA: Diagnosis present

## 2015-05-06 DIAGNOSIS — I5032 Chronic diastolic (congestive) heart failure: Secondary | ICD-10-CM | POA: Diagnosis present

## 2015-05-06 DIAGNOSIS — I130 Hypertensive heart and chronic kidney disease with heart failure and stage 1 through stage 4 chronic kidney disease, or unspecified chronic kidney disease: Secondary | ICD-10-CM | POA: Diagnosis present

## 2015-05-06 DIAGNOSIS — I480 Paroxysmal atrial fibrillation: Secondary | ICD-10-CM

## 2015-05-06 DIAGNOSIS — I10 Essential (primary) hypertension: Secondary | ICD-10-CM | POA: Diagnosis present

## 2015-05-06 DIAGNOSIS — D62 Acute posthemorrhagic anemia: Secondary | ICD-10-CM | POA: Diagnosis not present

## 2015-05-06 DIAGNOSIS — S7292XA Unspecified fracture of left femur, initial encounter for closed fracture: Secondary | ICD-10-CM | POA: Diagnosis present

## 2015-05-06 DIAGNOSIS — R52 Pain, unspecified: Secondary | ICD-10-CM

## 2015-05-06 DIAGNOSIS — T45515A Adverse effect of anticoagulants, initial encounter: Secondary | ICD-10-CM | POA: Diagnosis present

## 2015-05-06 DIAGNOSIS — S50312A Abrasion of left elbow, initial encounter: Secondary | ICD-10-CM | POA: Diagnosis present

## 2015-05-06 DIAGNOSIS — W1830XA Fall on same level, unspecified, initial encounter: Secondary | ICD-10-CM | POA: Diagnosis present

## 2015-05-06 DIAGNOSIS — I4891 Unspecified atrial fibrillation: Secondary | ICD-10-CM | POA: Diagnosis present

## 2015-05-06 DIAGNOSIS — Z7901 Long term (current) use of anticoagulants: Secondary | ICD-10-CM

## 2015-05-06 DIAGNOSIS — Z96641 Presence of right artificial hip joint: Secondary | ICD-10-CM | POA: Diagnosis present

## 2015-05-06 DIAGNOSIS — S72002A Fracture of unspecified part of neck of left femur, initial encounter for closed fracture: Secondary | ICD-10-CM

## 2015-05-06 DIAGNOSIS — I493 Ventricular premature depolarization: Secondary | ICD-10-CM | POA: Diagnosis present

## 2015-05-06 DIAGNOSIS — I2510 Atherosclerotic heart disease of native coronary artery without angina pectoris: Secondary | ICD-10-CM | POA: Diagnosis present

## 2015-05-06 DIAGNOSIS — S72012A Unspecified intracapsular fracture of left femur, initial encounter for closed fracture: Secondary | ICD-10-CM | POA: Diagnosis present

## 2015-05-06 DIAGNOSIS — E039 Hypothyroidism, unspecified: Secondary | ICD-10-CM | POA: Diagnosis present

## 2015-05-06 DIAGNOSIS — R0602 Shortness of breath: Secondary | ICD-10-CM

## 2015-05-06 HISTORY — DX: Family history of other specified conditions: Z84.89

## 2015-05-06 LAB — CBC WITH DIFFERENTIAL/PLATELET
BASOS PCT: 0 %
Basophils Absolute: 0 10*3/uL (ref 0.0–0.1)
EOS PCT: 2 %
Eosinophils Absolute: 0.3 10*3/uL (ref 0.0–0.7)
HCT: 41.8 % (ref 39.0–52.0)
Hemoglobin: 15 g/dL (ref 13.0–17.0)
LYMPHS ABS: 3.1 10*3/uL (ref 0.7–4.0)
Lymphocytes Relative: 19 %
MCH: 34.7 pg — AB (ref 26.0–34.0)
MCHC: 35.9 g/dL (ref 30.0–36.0)
MCV: 96.8 fL (ref 78.0–100.0)
MONO ABS: 1.1 10*3/uL — AB (ref 0.1–1.0)
Monocytes Relative: 7 %
Neutro Abs: 11.6 10*3/uL — ABNORMAL HIGH (ref 1.7–7.7)
Neutrophils Relative %: 72 %
PLATELETS: 216 10*3/uL (ref 150–400)
RBC: 4.32 MIL/uL (ref 4.22–5.81)
RDW: 12.5 % (ref 11.5–15.5)
WBC: 16.1 10*3/uL — ABNORMAL HIGH (ref 4.0–10.5)

## 2015-05-06 LAB — COMPREHENSIVE METABOLIC PANEL
ALT: 15 U/L — AB (ref 17–63)
AST: 25 U/L (ref 15–41)
Albumin: 3.6 g/dL (ref 3.5–5.0)
Alkaline Phosphatase: 87 U/L (ref 38–126)
Anion gap: 7 (ref 5–15)
BUN: 34 mg/dL — AB (ref 6–20)
CHLORIDE: 108 mmol/L (ref 101–111)
CO2: 25 mmol/L (ref 22–32)
CREATININE: 1.87 mg/dL — AB (ref 0.61–1.24)
Calcium: 8.7 mg/dL — ABNORMAL LOW (ref 8.9–10.3)
GFR calc Af Amer: 34 mL/min — ABNORMAL LOW (ref >60)
GFR, EST NON AFRICAN AMERICAN: 29 mL/min — AB (ref >60)
Glucose, Bld: 119 mg/dL — ABNORMAL HIGH (ref 65–99)
Potassium: 4.7 mmol/L (ref 3.5–5.1)
Sodium: 140 mmol/L (ref 135–145)
Total Bilirubin: 0.8 mg/dL (ref 0.3–1.2)
Total Protein: 6.3 g/dL — ABNORMAL LOW (ref 6.5–8.1)

## 2015-05-06 LAB — URINALYSIS, ROUTINE W REFLEX MICROSCOPIC
BILIRUBIN URINE: NEGATIVE
Glucose, UA: NEGATIVE mg/dL
HGB URINE DIPSTICK: NEGATIVE
Ketones, ur: NEGATIVE mg/dL
Leukocytes, UA: NEGATIVE
Nitrite: NEGATIVE
PH: 5 (ref 5.0–8.0)
Protein, ur: NEGATIVE mg/dL
SPECIFIC GRAVITY, URINE: 1.018 (ref 1.005–1.030)

## 2015-05-06 LAB — I-STAT TROPONIN, ED: Troponin i, poc: 0.01 ng/mL (ref 0.00–0.08)

## 2015-05-06 LAB — PROTIME-INR
INR: 2.71 — AB (ref 0.00–1.49)
Prothrombin Time: 28.3 seconds — ABNORMAL HIGH (ref 11.6–15.2)

## 2015-05-06 MED ORDER — METHOCARBAMOL 500 MG PO TABS: 500.0000 mg | ORAL_TABLET | Freq: Four times a day (QID) | ORAL | Status: DC | PRN

## 2015-05-06 MED ORDER — BUDESONIDE 0.25 MG/2ML IN SUSP
0.2500 mg | Freq: Two times a day (BID) | RESPIRATORY_TRACT | Status: DC
Start: 2015-05-06 — End: 2015-05-11
  Administered 2015-05-07 – 2015-05-11 (×9): 0.25 mg via RESPIRATORY_TRACT
  Filled 2015-05-06 (×10): qty 2

## 2015-05-06 MED ORDER — METHOCARBAMOL 1000 MG/10ML IJ SOLN
500.0000 mg | Freq: Four times a day (QID) | INTRAVENOUS | Status: DC | PRN
  Filled 2015-05-06: qty 5

## 2015-05-06 MED ORDER — VITAMIN K1 10 MG/ML IJ SOLN
10.0000 mg | Freq: Once | INTRAVENOUS | Status: AC
  Administered 2015-05-06: 10 mg via INTRAVENOUS
  Filled 2015-05-06: qty 1

## 2015-05-06 MED ORDER — LATANOPROST 0.005 % OP SOLN
1.0000 [drp] | Freq: Every day | OPHTHALMIC | Status: DC
  Administered 2015-05-06 – 2015-05-10 (×5): 1 [drp] via OPHTHALMIC
  Filled 2015-05-06 (×2): qty 2.5

## 2015-05-06 MED ORDER — CEFAZOLIN SODIUM-DEXTROSE 2-3 GM-% IV SOLR
2.0000 g | Freq: Once | INTRAVENOUS | Status: AC
  Administered 2015-05-07: 2 g via INTRAVENOUS
  Filled 2015-05-06 (×2): qty 50

## 2015-05-06 MED ORDER — LEVOTHYROXINE SODIUM 50 MCG PO TABS
50.0000 ug | ORAL_TABLET | Freq: Every day | ORAL | Status: DC
  Administered 2015-05-08 – 2015-05-11 (×4): 50 ug via ORAL
  Filled 2015-05-06 (×5): qty 1

## 2015-05-06 MED ORDER — PANTOPRAZOLE SODIUM 40 MG PO TBEC
40.0000 mg | DELAYED_RELEASE_TABLET | Freq: Every day | ORAL | Status: DC
  Administered 2015-05-06 – 2015-05-11 (×5): 40 mg via ORAL
  Filled 2015-05-06 (×5): qty 1

## 2015-05-06 MED ORDER — SODIUM CHLORIDE 0.9 % IV SOLN
INTRAVENOUS | Status: DC
  Administered 2015-05-06 – 2015-05-07 (×4): via INTRAVENOUS

## 2015-05-06 MED ORDER — FENTANYL CITRATE (PF) 100 MCG/2ML IJ SOLN
50.0000 ug | Freq: Once | INTRAMUSCULAR | Status: DC
  Filled 2015-05-06: qty 2

## 2015-05-06 MED ORDER — MORPHINE SULFATE (PF) 2 MG/ML IV SOLN
0.5000 mg | INTRAVENOUS | Status: DC | PRN
  Filled 2015-05-06: qty 1

## 2015-05-06 MED ORDER — AMLODIPINE BESYLATE 2.5 MG PO TABS
2.5000 mg | ORAL_TABLET | Freq: Every day | ORAL | Status: DC
  Administered 2015-05-06 – 2015-05-11 (×5): 2.5 mg via ORAL
  Filled 2015-05-06 (×5): qty 1

## 2015-05-06 MED ORDER — HYDROCODONE-ACETAMINOPHEN 5-325 MG PO TABS
1.0000 | ORAL_TABLET | Freq: Four times a day (QID) | ORAL | Status: DC | PRN
  Administered 2015-05-06: 1 via ORAL
  Filled 2015-05-06: qty 1

## 2015-05-06 MED ORDER — FENTANYL CITRATE (PF) 100 MCG/2ML IJ SOLN
25.0000 ug | Freq: Once | INTRAMUSCULAR | Status: AC
  Administered 2015-05-06: 25 ug via INTRAVENOUS

## 2015-05-06 MED ORDER — HYDROCHLOROTHIAZIDE 25 MG PO TABS
12.5000 mg | ORAL_TABLET | Freq: Every day | ORAL | Status: DC
  Administered 2015-05-06: 12.5 mg via ORAL
  Filled 2015-05-06: qty 1

## 2015-05-06 NOTE — H&P (Signed)
Patient Demographics  Jordan [name], is a 82 y.o. male  MRN: [id number]   DOB - 12/29/1918  Admit Date - 05/06/2015  Outpatient Primary MD for the patient is Kaleen Mask, MD   With History of -  Past Medical History  Diagnosis Date  . GERD (gastroesophageal reflux disease)   . Hypertension   . COPD (chronic obstructive pulmonary disease) (HCC)   . Chronic kidney disease   . Ventricular bigeminy   . PVC (premature ventricular contraction)   . Dyspnea   . Thyroid disease   . CHF (congestive heart failure) [facility])       Past Surgical History  Procedure Laterality Date  . Cholecystectomy    . Splenectomy, partial    . Esophagogastroduodenoscopy N/A 04/26/2013    Procedure: ESOPHAGOGASTRODUODENOSCOPY (EGD);  Surgeon: Florencia Reasons, MD;  Location: [facility] ENDOSCOPY;  Service: Endoscopy;  Laterality: N/A;  . Esophagogastroduodenoscopy (egd) with esophageal dilation N/A 04/26/2013    Procedure: ESOPHAGOGASTRODUODENOSCOPY (EGD) WITH ESOPHAGEAL DILATION;  Surgeon: Florencia Reasons, MD;  Location: [facility] ENDOSCOPY;  Service: Endoscopy;  Laterality: N/A;  . Hip arthroplasty Right 04/25/2013    Procedure: ARTHROPLASTY BIPOLAR HIP;  Surgeon: Nestor Lewandowsky, MD;  Location: [facility] OR;  Service: Orthopedics;  Laterality: Right;    in for   Chief Complaint  Patient presents with  . Fall     HPI  Jordan [name]  is a 82 y.o. male, with past medical history significant for COPD, diastolic CHF, atrial fibrillation on Coumadin, chronic kidney disease stage III, hypothyroidism, who presents with complaints of fall in ED, as walking from church to his car, denies any loss of consciousness, dizziness or lightheadedness, fell on concrete, reports significant left hip pain post fall, x-ray showing evidence of left hip fracture, patient reports chronic dyspnea which is about baseline, not hypoxic, there are no respiratory distress, denies any chest pain, palpitation, lab significant for elevated  creatinine at baseline, leukocytosis of  16,000, but he is afebrile, INR still pending, denies nausea, vomiting, melena, or hematochezia,, I was called to admit, ED consulted orthopedic service Dr.Rowen.    Review of Systems    In addition to the HPI above,  No Fever-chills, No Headache, No changes with Vision or hearing, No problems swallowing food or Liquids, No Chest pain, Cough or Shortness of Breath, No Abdominal pain, No Nausea or Vommitting, Bowel movements are regular, No Blood in stool or Urine, No dysuria, No new skin rashes or bruises, Complaints of left hip pain No new weakness, tingling, numbness in any extremity, No recent weight gain or loss, No polyuria, polydypsia or polyphagia, No significant Mental Stressors.  A full 10 point Review of Systems was done, except as stated above, all other Review of Systems were negative.   Social History Social History  Substance Use Topics  . Smoking status: Never Smoker   . Smokeless tobacco: Not on file  . Alcohol Use: No     Family History Family History  Problem Relation Age of Onset  . Hypertension Father      Prior to Admission medications   Medication Sig Start Date End Date Taking? Authorizing Provider  acetaminophen (TYLENOL) 500 MG tablet Take 1,000 mg by mouth 2 (two) times daily.    Yes Historical Provider, MD  albuterol (PROVENTIL HFA;VENTOLIN HFA) 108 (90 BASE) MCG/ACT inhaler Inhale 2 puffs into the lungs every 6 (six) hours as needed for wheezing.   Yes Historical Provider, MD  amLODipine (NORVASC) 2.5 MG  tablet Take 2.5 mg by mouth daily.   Yes Historical Provider, MD  hydrochlorothiazide (HYDRODIURIL) 25 MG tablet Take 12.5 mg by mouth daily.    Yes Historical Provider, MD  latanoprost (XALATAN) 0.005 % ophthalmic solution Place 1 drop into both eyes at bedtime. For glaucoma   Yes Historical Provider, MD  levothyroxine (SYNTHROID, LEVOTHROID) 50 MCG tablet Take 50 mcg by mouth daily before breakfast.    Yes Historical Provider, MD  omeprazole (PRILOSEC) 20 MG capsule Take 20 mg by mouth daily. For GERD   Yes Historical Provider, MD  traMADol (ULTRAM) 50 MG tablet Take 50-100 mg by mouth every 4 (four) hours as needed for moderate pain.   Yes Historical Provider, MD  warfarin (COUMADIN) 2.5 MG tablet TAKE AS DIRECTED Patient taking differently: TAKE 1 TABLET BY MOUTH ONCE DAILY IN THE EVENING 04/28/15  Yes Jake Bathe, MD    Allergies  Allergen Reactions  . Diltiazem Hcl Er Beads Other (See Comments)    Tachycardia  . Metoprolol Succinate Er [Metoprolol Tartrate] Other (See Comments)    hallucinations    Physical Exam  Vitals  Blood pressure 151/74, pulse 87, temperature 97.2 F (36.2 C), temperature source Oral, resp. rate 20, SpO2 96 %.   1. General frail elderly male lying in bed in NAD,    2. Normal affect and insight, Not Suicidal or Homicidal, Awake Alert, Oriented X 3.  3. No F.N deficits, ALL C.Nerves Intact, strength grossly intact, limited in left lower extremity secondary to pain , Sensation intact all 4 extremities, Plantars down going.  4. Ears and Eyes appear Normal, Conjunctivae clear, PERRLA. Moist Oral Mucosa.  5. Supple Neck, No JVD, No cervical lymphadenopathy appriciated, No Carotid Bruits.  6. Symmetrical Chest wall movement, Good air movement bilaterally, CTAB.  7. RRR, No Gallops, Rubs , No Parasternal Heave.  8. Positive Bowel Sounds, Abdomen Soft, No tenderness, No organomegaly appriciated,No rebound -guarding or rigidity.  9.  No Cyanosis, Normal Skin Turgor, No Skin Rash or Bruise.  10. Good muscle tone,  left lower extremity externally rotated and shorter than right.  11. No Palpable Lymph Nodes in Neck or Axillae    Data Review  CBC  Recent Labs Lab 05/06/15 1448  WBC 16.1*  HGB 15.0  HCT 41.8  PLT 216  MCV 96.8  MCH 34.7*  MCHC 35.9  RDW 12.5  LYMPHSABS 3.1  MONOABS 1.1*  EOSABS 0.3  BASOSABS 0.0    ------------------------------------------------------------------------------------------------------------------  Chemistries   Recent Labs Lab 05/06/15 1407  NA 140  K 4.7  CL 108  CO2 25  GLUCOSE 119*  BUN 34*  CREATININE 1.87*  CALCIUM 8.7*  AST 25  ALT 15*  ALKPHOS 87  BILITOT 0.8   ------------------------------------------------------------------------------------------------------------------ CrCl cannot be calculated (Unknown ideal weight.). ------------------------------------------------------------------------------------------------------------------ No results for input(s): TSH, T4TOTAL, T3FREE, THYROIDAB in the last 72 hours.  Invalid input(s): FREET3   Coagulation profile No results for input(s): INR, PROTIME in the last 168 hours. ------------------------------------------------------------------------------------------------------------------- No results for input(s): DDIMER in the last 72 hours. -------------------------------------------------------------------------------------------------------------------  Cardiac Enzymes No results for input(s): CKMB, TROPONINI, MYOGLOBIN in the last 168 hours.  Invalid input(s): CK ------------------------------------------------------------------------------------------------------------------ Invalid input(s): POCBNP   ---------------------------------------------------------------------------------------------------------------  Urinalysis    Component Value Date/Time   COLORURINE YELLOW 07/10/2013 1520   APPEARANCEUR CLEAR 07/10/2013 1520   LABSPEC 1.021 07/10/2013 1520   PHURINE 5.5 07/10/2013 1520   GLUCOSEU NEGATIVE 07/10/2013 1520   HGBUR NEGATIVE 07/10/2013 1520   BILIRUBINUR NEGATIVE 07/10/2013 1520  KETONESUR NEGATIVE 07/10/2013 1520   PROTEINUR NEGATIVE 07/10/2013 1520   UROBILINOGEN 0.2 07/10/2013 1520   NITRITE NEGATIVE 07/10/2013 1520   LEUKOCYTESUR NEGATIVE 07/10/2013 1520     ----------------------------------------------------------------------------------------------------------------  Imaging results:   Dg Chest 1 View  05/06/2015  CLINICAL DATA:  Larey Seat today while leaving church.  Syncopal episode. EXAM: CHEST 1 VIEW COMPARISON:  07/10/2013. FINDINGS: The heart is upper limits of normal in size given the AP projection and supine position of the patient. There is tortuosity and calcification of the thoracic aorta. The lungs demonstrate emphysematous changes and pulmonary scarring with hyperinflation. Stable scarring changes at the right lung base. No acute overlying pulmonary process. The bony thorax is intact. IMPRESSION: Chronic emphysematous changes and pulmonary scarring. No acute pulmonary findings. Electronically Signed   By: Rudie Meyer M.D.   On: 05/06/2015 14:06   Dg Pelvis 1-2 Views  05/06/2015  CLINICAL DATA:  Fall, left hip pain EXAM: PELVIS - 1-2 VIEW COMPARISON:  04/25/2013 FINDINGS: Single frontal view of the pelvis submitted. Again noted right hip prosthesis with anatomic alignment. There is displaced mild impacted fracture of the left femoral neck. Diffuse osteopenia. IMPRESSION: Displaced mild impacted fracture of the left femoral neck. Electronically Signed   By: Natasha Mead M.D.   On: 05/06/2015 13:59   Dg Femur Min 2 Views Left  05/06/2015  CLINICAL DATA:  Left leg gave out.  Pain in left hip. EXAM: LEFT FEMUR 2 VIEWS COMPARISON:  Pelvis 05/06/2015 FINDINGS: Displaced fracture of the proximal left femur at the junction of the left femoral head and neck. This is compatible with a subcapital fracture. Proximal displacement of the distal fragment. Left femoral head is located. Visualized pelvic bony structures are intact. The distal left femur is intact. Patient has a right hip arthroplasty which is partially visualized. IMPRESSION: Subcapital fracture of the proximal left femur. Electronically Signed   By: Richarda Overlie M.D.   On: 05/06/2015 13:58     My personal review of EKG: Rhythm NSR, Rate  74 /min, QTc 448 , no Acute ST changes    Assessment & Plan  Active Problems:   Hypertension   Hypothyroidism   Atrial fibrillation (HCC)   COPD (chronic obstructive pulmonary disease) (HCC)   Chronic anticoagulation   Chronic diastolic heart failure (HCC)   Chronic kidney disease (CKD) stage G3a/A1   Hip fracture (HCC)   Hip fracture - Closed left hip fracture, secondary to mechanical fall - Hip fracture order set, continue with pain control, bed rest, - Plan for hemiarthroplasty on 2/23, patient in no respiratory distress, no hypoxia, not in decompensated heart failure, or significant malignant arrhythmias , patient is moderate risk given his age, and chronic kidney disease, no further workup indicated prior to surgery after correction of his coagulopathy. - Patient received IV vitamin K to reverse her coagulopathy secondary to warfarin, will check INR in a.m., repeat INR in morning, may need FFP prior to surgery.  Hypertension   - Stable, continue with home medication  Hypothyroidism - Continue with Synthroid  History of atrial fibrillation - Currently normal sinus rhythm, rate control, not in any AV blocking agents - On warfarin, received IV vitamin K as INR 2.7  COPD - No active wheezing, continue with nebs as needed, encouraged to use incentive spirometry, will start on Pulmicort .  Chronic anticoagulation - To be reversed prior to surgery, will check INR in a.m., if remains elevated will need FFP prior to surgery.  Chronic diastolic CHF -  Compensated, no volume overload, continue with hydrochlorothiazide  Chronic kidney disease stage III - Stable, at baseline, continue to monitor  Leukocytosis - Afebrile, negative urinalysis, secondary to demargination from stress candidate fracture  DVT Prophylaxis - SCDs  AM Labs Ordered, also please review Full Orders  Family Communication: Admission, patients condition  and plan of care including tests being ordered have been discussed with the patient and daughters who indicate understanding and agree with the plan and Code Status.  Code Status Full(confirmed by patient)  Likely DC to  : pending PT  Condition GUARDED    Time spent in minutes : 65 minutes    Akima Slaugh M.D on 05/06/2015 at 4:53 PM  Between 7am to 7pm - Pager - [phone]  After 7pm go to [email or web address] - password [email or web address]  And look for the night coverage person covering me after hours  [facility] Office  [phone]

## 2015-05-06 NOTE — Consult Note (Signed)
Reason for Consult: Displaced left femoral neck fracture Referring Physician: Elgergawy  Jordan [name] is an 82 y.o. male.  HPI: 82 year old man who fell onto his left hip today and sustained a displaced left femoral neck fracture. I met him in 2015 when he had a similar 3 to his right hip and underwent a monopolar hemiarthroplasty with out complication. He is on blood thinners and has an INR of 2.7. He also sustained some minor abrasions to the left elbow which has a full range of motion he denies any loss of consciousness.  Past Medical History  Diagnosis Date  . GERD (gastroesophageal reflux disease)   . Hypertension   . COPD (chronic obstructive pulmonary disease) ([place])   . Chronic kidney disease   . Ventricular bigeminy   . PVC (premature ventricular contraction)   . Dyspnea   . Thyroid disease   . CHF (congestive heart failure) [facility])     Past Surgical History  Procedure Laterality Date  . Cholecystectomy    . Splenectomy, partial    . Esophagogastroduodenoscopy N/A 04/26/2013    Procedure: ESOPHAGOGASTRODUODENOSCOPY (EGD);  Surgeon: Jordan Nipper, MD;  Location: [facility] ENDOSCOPY;  Service: Endoscopy;  Laterality: N/A;  . Esophagogastroduodenoscopy (egd) with esophageal dilation N/A 04/26/2013    Procedure: ESOPHAGOGASTRODUODENOSCOPY (EGD) WITH ESOPHAGEAL DILATION;  Surgeon: Jordan Nipper, MD;  Location: [place];  Service: Endoscopy;  Laterality: N/A;  . Hip arthroplasty Right 04/25/2013    Procedure: ARTHROPLASTY BIPOLAR HIP;  Surgeon: Jordan Salen, MD;  Location: [place];  Service: Orthopedics;  Laterality: Right;    Family History  Problem Relation Age of Onset  . Hypertension Father     Social History:  reports that he has never smoked. He does not have any smokeless tobacco history on file. He reports that he does not drink alcohol or use illicit drugs.  Allergies:  Allergies  Allergen Reactions  . Diltiazem Hcl Er Beads Other (See Comments)    Tachycardia  .  Metoprolol Succinate Er [Metoprolol Tartrate] Other (See Comments)    hallucinations    Medications: I have reviewed the patient's current medications.  Results for orders placed or performed during the hospital encounter of 05/06/15 (from the past 48 hour(s))  Comprehensive metabolic panel     Status: Abnormal   Collection Time: 05/06/15  2:07 PM  Result Value Ref Range   Sodium 140 135 - 145 mmol/L   Potassium 4.7 3.5 - 5.1 mmol/L    Comment: SPECIMEN HEMOLYZED. HEMOLYSIS MAY AFFECT INTEGRITY OF RESULTS.   Chloride 108 101 - 111 mmol/L   CO2 25 22 - 32 mmol/L   Glucose, Bld 119 (H) 65 - 99 mg/dL   BUN 34 (H) 6 - 20 mg/dL   Creatinine, Ser 1.87 (H) 0.61 - 1.24 mg/dL   Calcium 8.7 (L) 8.9 - 10.3 mg/dL   Total Protein 6.3 (L) 6.5 - 8.1 g/dL   Albumin 3.6 3.5 - 5.0 g/dL   AST 25 15 - 41 U/L   ALT 15 (L) 17 - 63 U/L   Alkaline Phosphatase 87 38 - 126 U/L   Total Bilirubin 0.8 0.3 - 1.2 mg/dL   GFR calc non Af Amer 29 (L) >60 mL/min   GFR calc Af Amer 34 (L) >60 mL/min    Comment: (NOTE) The eGFR has been calculated using the CKD EPI equation. This calculation has not been validated in all clinical situations. eGFR's persistently <60 mL/min signify possible Chronic Kidney Disease.    Anion  gap 7 5 - 15  CBC with Differential     Status: Abnormal   Collection Time: 05/06/15  2:48 PM  Result Value Ref Range   WBC 16.1 (H) 4.0 - 10.5 K/uL   RBC 4.32 4.22 - 5.81 MIL/uL   Hemoglobin 15.0 13.0 - 17.0 g/dL   HCT 41.8 39.0 - 52.0 %   MCV 96.8 78.0 - 100.0 fL   MCH 34.7 (H) 26.0 - 34.0 pg   MCHC 35.9 30.0 - 36.0 g/dL   RDW 12.5 11.5 - 15.5 %   Platelets 216 150 - 400 K/uL   Neutrophils Relative % 72 %   Lymphocytes Relative 19 %   Monocytes Relative 7 %   Eosinophils Relative 2 %   Basophils Relative 0 %   Neutro Abs 11.6 (H) 1.7 - 7.7 K/uL   Lymphs Abs 3.1 0.7 - 4.0 K/uL   Monocytes Absolute 1.1 (H) 0.1 - 1.0 K/uL   Eosinophils Absolute 0.3 0.0 - 0.7 K/uL   Basophils  Absolute 0.0 0.0 - 0.1 K/uL   RBC Morphology HOWELL/JOLLY BODIES     Comment: ACANTHOCYTES  Urinalysis, Routine w reflex microscopic (not at [facility])     Status: None   Collection Time: 05/06/15  4:16 PM  Result Value Ref Range   Color, Urine YELLOW YELLOW   APPearance CLEAR CLEAR   Specific Gravity, Urine 1.018 1.005 - 1.030   pH 5.0 5.0 - 8.0   Glucose, UA NEGATIVE NEGATIVE mg/dL   Hgb urine dipstick NEGATIVE NEGATIVE   Bilirubin Urine NEGATIVE NEGATIVE   Ketones, ur NEGATIVE NEGATIVE mg/dL   Protein, ur NEGATIVE NEGATIVE mg/dL   Nitrite NEGATIVE NEGATIVE   Leukocytes, UA NEGATIVE NEGATIVE    Comment: MICROSCOPIC NOT DONE ON URINES WITH NEGATIVE PROTEIN, BLOOD, LEUKOCYTES, NITRITE, OR GLUCOSE <1000 mg/dL.  I-stat troponin, ED     Status: None   Collection Time: 05/06/15  4:28 PM  Result Value Ref Range   Troponin i, poc 0.01 0.00 - 0.08 ng/mL   Comment 3            Comment: Due to the release kinetics of cTnI, a negative result within the first hours of the onset of symptoms does not rule out myocardial infarction with certainty. If myocardial infarction is still suspected, repeat the test at appropriate intervals.   Protime-INR     Status: Abnormal   Collection Time: 05/06/15  4:30 PM  Result Value Ref Range   Prothrombin Time 28.3 (H) 11.6 - 15.2 seconds   INR 2.71 (H) 0.00 - 1.49    Dg Chest 1 View  05/06/2015  CLINICAL DATA:  [place] today while leaving church.  Syncopal episode. EXAM: CHEST 1 VIEW COMPARISON:  07/10/2013. FINDINGS: The heart is upper limits of normal in size given the AP projection and supine position of the patient. There is tortuosity and calcification of the thoracic aorta. The lungs demonstrate emphysematous changes and pulmonary scarring with hyperinflation. Stable scarring changes at the right lung base. No acute overlying pulmonary process. The bony thorax is intact. IMPRESSION: Chronic emphysematous changes and pulmonary scarring. No acute pulmonary  findings. Electronically Signed   By: Jordan [name] M.D.   On: 05/06/2015 14:06   Dg Pelvis 1-2 Views  05/06/2015  CLINICAL DATA:  Fall, left hip pain EXAM: PELVIS - 1-2 VIEW COMPARISON:  04/25/2013 FINDINGS: Single frontal view of the pelvis submitted. Again noted right hip prosthesis with anatomic alignment. There is displaced mild impacted fracture of the left  femoral neck. Diffuse osteopenia. IMPRESSION: Displaced mild impacted fracture of the left femoral neck. Electronically Signed   By: Lahoma Crocker M.D.   On: 05/06/2015 13:59   Dg Femur Min 2 Views Left  05/06/2015  CLINICAL DATA:  Left leg gave out.  Pain in left hip. EXAM: LEFT FEMUR 2 VIEWS COMPARISON:  Pelvis 05/06/2015 FINDINGS: Displaced fracture of the proximal left femur at the junction of the left femoral head and neck. This is compatible with a subcapital fracture. Proximal displacement of the distal fragment. Left femoral head is located. Visualized pelvic bony structures are intact. The distal left femur is intact. Patient has a right hip arthroplasty which is partially visualized. IMPRESSION: Subcapital fracture of the proximal left femur. Electronically Signed   By: Markus Daft M.D.   On: 05/06/2015 13:58    ROS patient denies any shortness of breath, denies any chest pain and denies any loss of consciousness. Blood pressure 151/74, pulse 87, temperature 97.2 F (36.2 C), temperature source Oral, resp. rate 20, SpO2 96 %. Physical Exam The left lower extremity is externally rotated 90 shortened 4 cm he can move his toes up and down without difficulty his toes are pink and well-perfused are no cuts scrapes or abrasions over the skin of his left hip no effusion in the left knee. The left elbow does have a superficial abrasion full range of motion. Assessment/Plan: Assessment: Displaced left femoral neck fracture 82 year old man who is on Coumadin for cardiac arrhythmias.  Plan: Will hold Coumadin, tentatively scheduled for  hemiarthroplasty at 1315 hrs. tomorrow 05/07/2015 assuming he is cleared by the hospitalist service. Risks and benefits have been discussed with the patient his wife and family and they are on board with hemiarthroplasty.  Frederik Pear J 05/06/2015, 5:20 PM

## 2015-05-06 NOTE — ED Provider Notes (Signed)
CSN: [id number]     Arrival date & time 05/06/15  1231 History   First MD Initiated Contact with Patient 05/06/15 1237     Chief Complaint  Patient presents with  . Fall     (Consider location/radiation/quality/duration/timing/severity/associated sxs/prior Treatment) HPI   Patient is a 82 year old male past medical history significant for GERD and hypertension COPD CAD CHF presenting today with fall onto left hip. Patient exteriorly rotated left leg. Patient said he felt his left leg gave way and fell down. Did not strike his head. No loss consciousness.  Patient has pain only with palpation or movement of the left leg.  Right hip done in February 2015 by Dr. Turner Daniels.  Past Medical History  Diagnosis Date  . GERD (gastroesophageal reflux disease)   . Hypertension   . COPD (chronic obstructive pulmonary disease) (HCC)   . Chronic kidney disease   . Ventricular bigeminy   . PVC (premature ventricular contraction)   . Dyspnea   . Thyroid disease   . CHF (congestive heart failure) [facility])    Past Surgical History  Procedure Laterality Date  . Cholecystectomy    . Splenectomy, partial    . Esophagogastroduodenoscopy N/A 04/26/2013    Procedure: ESOPHAGOGASTRODUODENOSCOPY (EGD);  Surgeon: Florencia Reasons, MD;  Location: [facility] ENDOSCOPY;  Service: Endoscopy;  Laterality: N/A;  . Esophagogastroduodenoscopy (egd) with esophageal dilation N/A 04/26/2013    Procedure: ESOPHAGOGASTRODUODENOSCOPY (EGD) WITH ESOPHAGEAL DILATION;  Surgeon: Florencia Reasons, MD;  Location: [facility] ENDOSCOPY;  Service: Endoscopy;  Laterality: N/A;  . Hip arthroplasty Right 04/25/2013    Procedure: ARTHROPLASTY BIPOLAR HIP;  Surgeon: Nestor Lewandowsky, MD;  Location: [facility] OR;  Service: Orthopedics;  Laterality: Right;   Family History  Problem Relation Age of Onset  . Hypertension Father    Social History  Substance Use Topics  . Smoking status: Never Smoker   . Smokeless tobacco: None  . Alcohol Use: No    Review of  Systems  Constitutional: Negative for activity change.  Respiratory: Negative for shortness of breath.   Cardiovascular: Negative for chest pain.  Gastrointestinal: Negative for abdominal pain.  Genitourinary: Negative for dysuria.  Neurological: Negative for dizziness and headaches.      Allergies  Diltiazem hcl er beads and Metoprolol succinate er  Home Medications   Prior to Admission medications   Medication Sig Start Date End Date Taking? Authorizing Provider  acetaminophen (TYLENOL) 500 MG tablet Take 1,000 mg by mouth 2 (two) times daily.    Yes Historical Provider, MD  albuterol (PROVENTIL HFA;VENTOLIN HFA) 108 (90 BASE) MCG/ACT inhaler Inhale 2 puffs into the lungs every 6 (six) hours as needed for wheezing.   Yes Historical Provider, MD  amLODipine (NORVASC) 2.5 MG tablet Take 2.5 mg by mouth daily.   Yes Historical Provider, MD  hydrochlorothiazide (HYDRODIURIL) 25 MG tablet Take 12.5 mg by mouth daily.    Yes Historical Provider, MD  latanoprost (XALATAN) 0.005 % ophthalmic solution Place 1 drop into both eyes at bedtime. For glaucoma   Yes Historical Provider, MD  levothyroxine (SYNTHROID, LEVOTHROID) 50 MCG tablet Take 50 mcg by mouth daily before breakfast.   Yes Historical Provider, MD  omeprazole (PRILOSEC) 20 MG capsule Take 20 mg by mouth daily. For GERD   Yes Historical Provider, MD  traMADol (ULTRAM) 50 MG tablet Take 50-100 mg by mouth every 4 (four) hours as needed for moderate pain.   Yes Historical Provider, MD  warfarin (COUMADIN) 2.5 MG tablet TAKE AS DIRECTED Patient  taking differently: TAKE 1 TABLET BY MOUTH ONCE DAILY IN THE EVENING 04/28/15  Yes Jake Bathe, MD   BP 165/89 mmHg  Pulse 70  Temp(Src) 97.2 F (36.2 C) (Oral)  Resp 20  SpO2 95% Physical Exam  Constitutional: He is oriented to person, place, and time. He appears well-nourished.  HENT:  Head: Normocephalic.  Mouth/Throat: Oropharynx is clear and moist.  Eyes: Conjunctivae are normal.   Neck: No tracheal deviation present.  Cardiovascular: Normal rate.   Pulmonary/Chest: Effort normal. No stridor. He has wheezes.  Abdominal: Soft. There is no tenderness. There is no guarding.  Musculoskeletal: Normal range of motion. He exhibits no edema.  Left leg externally rotated and shortened. Pulses intact. Sensation and movement intact.  Neurological: He is oriented to person, place, and time. No cranial nerve deficit.  Skin: Skin is warm and dry. He is not diaphoretic.  Psychiatric: He has a normal mood and affect. His behavior is normal.  Nursing note and vitals reviewed.   ED Course  Procedures (including critical care time) Labs Review Labs Reviewed  COMPREHENSIVE METABOLIC PANEL - Abnormal; Notable for the following:    Glucose, Bld 119 (*)    BUN 34 (*)    Creatinine, Ser 1.87 (*)    Calcium 8.7 (*)    Total Protein 6.3 (*)    ALT 15 (*)    GFR calc non Af Amer 29 (*)    GFR calc Af Amer 34 (*)    All other components within normal limits  CBC WITH DIFFERENTIAL/PLATELET - Abnormal; Notable for the following:    WBC 16.1 (*)    MCH 34.7 (*)    All other components within normal limits  CBC WITH DIFFERENTIAL/PLATELET  URINALYSIS, ROUTINE W REFLEX MICROSCOPIC (NOT AT [facility])    Imaging Review Dg Chest 1 View  05/06/2015  CLINICAL DATA:  Larey Seat today while leaving church.  Syncopal episode. EXAM: CHEST 1 VIEW COMPARISON:  07/10/2013. FINDINGS: The heart is upper limits of normal in size given the AP projection and supine position of the patient. There is tortuosity and calcification of the thoracic aorta. The lungs demonstrate emphysematous changes and pulmonary scarring with hyperinflation. Stable scarring changes at the right lung base. No acute overlying pulmonary process. The bony thorax is intact. IMPRESSION: Chronic emphysematous changes and pulmonary scarring. No acute pulmonary findings. Electronically Signed   By: Rudie Meyer M.D.   On: 05/06/2015 14:06   Dg  Pelvis 1-2 Views  05/06/2015  CLINICAL DATA:  Fall, left hip pain EXAM: PELVIS - 1-2 VIEW COMPARISON:  04/25/2013 FINDINGS: Single frontal view of the pelvis submitted. Again noted right hip prosthesis with anatomic alignment. There is displaced mild impacted fracture of the left femoral neck. Diffuse osteopenia. IMPRESSION: Displaced mild impacted fracture of the left femoral neck. Electronically Signed   By: Natasha Mead M.D.   On: 05/06/2015 13:59   Dg Femur Min 2 Views Left  05/06/2015  CLINICAL DATA:  Left leg gave out.  Pain in left hip. EXAM: LEFT FEMUR 2 VIEWS COMPARISON:  Pelvis 05/06/2015 FINDINGS: Displaced fracture of the proximal left femur at the junction of the left femoral head and neck. This is compatible with a subcapital fracture. Proximal displacement of the distal fragment. Left femoral head is located. Visualized pelvic bony structures are intact. The distal left femur is intact. Patient has a right hip arthroplasty which is partially visualized. IMPRESSION: Subcapital fracture of the proximal left femur. Electronically Signed   By: Madelaine Bhat  Lowella Dandy M.D.   On: 05/06/2015 13:58   I have personally reviewed and evaluated these images and lab results as part of my medical decision-making.   EKG Interpretation   Date/Time:  Thursday May 06 2015 13:57:55 EST Ventricular Rate:  74 PR Interval:  173 QRS Duration: 107 QT Interval:  404 QTC Calculation: 448 R Axis:   90 Text Interpretation:  Sinus rhythm Paired ventricular premature complexes  Borderline right axis deviation PVCs. no ischemia.  Confirmed by Kandis Mannan ([id number]) on 05/06/2015 3:46:17 PM      MDM   Final diagnoses:  Pain  Fall    Patient is a 82 year old gentleman who fell down today onto his left hip. Patient did not syncope, he just reports weakness. Patient's left leg is externally rotated and shortened. Concern for left hip fracture. Patient had right hip done by Dr. Turner Daniels 2 years ago. We'll get plain  films, workup for weakness.   3:46 PM Impacted fem head fracture. Discussed with [facility]an's coverage. Will admit to hospitlitst.   Courteney Randall An, MD 05/06/15 1546

## 2015-05-06 NOTE — ED Notes (Signed)
Patient being transported to CT at this time; visitors waiting in room

## 2015-05-06 NOTE — ED Notes (Signed)
Pt to ER via [facility] due to fall. Pt was leaving church when he reports his left leg just "gave out." pt has no obvious deformity noted. Denies hitting his head. Pt is a/o x4. Pt received 100 mcg of fentanyl in route. VSS.

## 2015-05-07 ENCOUNTER — Inpatient Hospital Stay (HOSPITAL_COMMUNITY)
Admission: EM | Disposition: A | Discharge status: Skilled Nursing Facility | Payer: Medicare Other | Source: Home / Self Care | Attending: Internal Medicine

## 2015-05-07 ENCOUNTER — Inpatient Hospital Stay (HOSPITAL_COMMUNITY): Payer: Medicare Other | Admitting: Anesthesiology

## 2015-05-07 ENCOUNTER — Inpatient Hospital Stay (HOSPITAL_COMMUNITY): Payer: Medicare Other

## 2015-05-07 ENCOUNTER — Encounter (HOSPITAL_COMMUNITY): Payer: Self-pay | Admitting: Anesthesiology

## 2015-05-07 DIAGNOSIS — S7292XA Unspecified fracture of left femur, initial encounter for closed fracture: Secondary | ICD-10-CM | POA: Diagnosis present

## 2015-05-07 HISTORY — PX: HIP ARTHROPLASTY: SHX981

## 2015-05-07 LAB — CBC
HCT: 43.9 % (ref 39.0–52.0)
HEMOGLOBIN: 15.6 g/dL (ref 13.0–17.0)
MCH: 34.3 pg — ABNORMAL HIGH (ref 26.0–34.0)
MCHC: 35.5 g/dL (ref 30.0–36.0)
MCV: 96.5 fL (ref 78.0–100.0)
Platelets: 229 10*3/uL (ref 150–400)
RBC: 4.55 MIL/uL (ref 4.22–5.81)
RDW: 12.6 % (ref 11.5–15.5)
WBC: 15.4 10*3/uL — ABNORMAL HIGH (ref 4.0–10.5)

## 2015-05-07 LAB — URINALYSIS, ROUTINE W REFLEX MICROSCOPIC
Bilirubin Urine: NEGATIVE
Glucose, UA: NEGATIVE mg/dL
Hgb urine dipstick: NEGATIVE
KETONES UR: NEGATIVE mg/dL
Leukocytes, UA: NEGATIVE
NITRITE: NEGATIVE
PH: 5 (ref 5.0–8.0)
PROTEIN: NEGATIVE mg/dL
SPECIFIC GRAVITY, URINE: 1.016 (ref 1.005–1.030)

## 2015-05-07 LAB — BASIC METABOLIC PANEL
Anion gap: 11 (ref 5–15)
BUN: 28 mg/dL — ABNORMAL HIGH (ref 6–20)
CALCIUM: 8.8 mg/dL — AB (ref 8.9–10.3)
CO2: 22 mmol/L (ref 22–32)
Chloride: 104 mmol/L (ref 101–111)
Creatinine, Ser: 1.52 mg/dL — ABNORMAL HIGH (ref 0.61–1.24)
GFR calc non Af Amer: 37 mL/min — ABNORMAL LOW (ref >60)
GFR, EST AFRICAN AMERICAN: 43 mL/min — AB (ref >60)
Glucose, Bld: 116 mg/dL — ABNORMAL HIGH (ref 65–99)
Potassium: 4.4 mmol/L (ref 3.5–5.1)
SODIUM: 137 mmol/L (ref 135–145)

## 2015-05-07 LAB — SURGICAL PCR SCREEN
MRSA, PCR: NEGATIVE
Staphylococcus aureus: NEGATIVE

## 2015-05-07 LAB — PROTIME-INR
INR: 1.46 (ref 0.00–1.49)
PROTHROMBIN TIME: 17.8 s — AB (ref 11.6–15.2)

## 2015-05-07 SURGERY — HEMIARTHROPLASTY, HIP, DIRECT ANTERIOR APPROACH, FOR FRACTURE
Anesthesia: General | Site: Hip | Laterality: Left

## 2015-05-07 MED ORDER — LIDOCAINE HCL (CARDIAC) 20 MG/ML IV SOLN
INTRAVENOUS | Status: DC | PRN
  Administered 2015-05-07: 60 mg via INTRAVENOUS

## 2015-05-07 MED ORDER — BISACODYL 5 MG PO TBEC
5.0000 mg | DELAYED_RELEASE_TABLET | Freq: Every day | ORAL | Status: DC | PRN
  Administered 2015-05-09: 5 mg via ORAL
  Filled 2015-05-07: qty 1

## 2015-05-07 MED ORDER — METOCLOPRAMIDE HCL 5 MG/ML IJ SOLN: 5.0000 mg | Freq: Three times a day (TID) | INTRAMUSCULAR | Status: DC | PRN

## 2015-05-07 MED ORDER — PROPOFOL 10 MG/ML IV BOLUS
INTRAVENOUS | Status: AC
  Filled 2015-05-07: qty 20

## 2015-05-07 MED ORDER — 0.9 % SODIUM CHLORIDE (POUR BTL) OPTIME
TOPICAL | Status: DC | PRN
  Administered 2015-05-07: 1000 mL

## 2015-05-07 MED ORDER — FLEET ENEMA 7-19 GM/118ML RE ENEM: 1.0000 | ENEMA | Freq: Once | RECTAL | Status: DC | PRN

## 2015-05-07 MED ORDER — FENTANYL CITRATE (PF) 250 MCG/5ML IJ SOLN
INTRAMUSCULAR | Status: AC
  Filled 2015-05-07: qty 5

## 2015-05-07 MED ORDER — PHENOL 1.4 % MT LIQD: 1.0000 | OROMUCOSAL | Status: DC | PRN

## 2015-05-07 MED ORDER — FENTANYL CITRATE (PF) 100 MCG/2ML IJ SOLN
INTRAMUSCULAR | Status: DC | PRN
  Administered 2015-05-07: 50 ug via INTRAVENOUS

## 2015-05-07 MED ORDER — GLYCOPYRROLATE 0.2 MG/ML IJ SOLN
INTRAMUSCULAR | Status: DC | PRN
  Administered 2015-05-07: .3 mg via INTRAVENOUS

## 2015-05-07 MED ORDER — ONDANSETRON HCL 4 MG/2ML IJ SOLN: 4.0000 mg | Freq: Four times a day (QID) | INTRAMUSCULAR | Status: DC | PRN

## 2015-05-07 MED ORDER — TIZANIDINE HCL 2 MG PO CAPS: 2.0000 mg | ORAL_CAPSULE | Freq: Three times a day (TID) | ORAL | Status: DC

## 2015-05-07 MED ORDER — MIDAZOLAM HCL 2 MG/2ML IJ SOLN
INTRAMUSCULAR | Status: AC
  Filled 2015-05-07: qty 2

## 2015-05-07 MED ORDER — WARFARIN - PHYSICIAN DOSING INPATIENT: Freq: Every day | Status: DC

## 2015-05-07 MED ORDER — MENTHOL 3 MG MT LOZG: 1.0000 | LOZENGE | OROMUCOSAL | Status: DC | PRN

## 2015-05-07 MED ORDER — MORPHINE SULFATE (PF) 2 MG/ML IV SOLN
0.5000 mg | INTRAVENOUS | Status: DC | PRN
  Administered 2015-05-09 (×2): 0.5 mg via INTRAVENOUS
  Filled 2015-05-07: qty 1

## 2015-05-07 MED ORDER — SODIUM CHLORIDE 0.9 % IR SOLN
Status: DC | PRN
Start: 2015-05-07 — End: 2015-05-07
  Administered 2015-05-07: 3000 mL

## 2015-05-07 MED ORDER — NEOSTIGMINE METHYLSULFATE 10 MG/10ML IV SOLN
INTRAVENOUS | Status: DC | PRN
  Administered 2015-05-07: 3.5 mg via INTRAVENOUS

## 2015-05-07 MED ORDER — DEXAMETHASONE SODIUM PHOSPHATE 4 MG/ML IJ SOLN
INTRAMUSCULAR | Status: DC | PRN
  Administered 2015-05-07: 8 mg via INTRAVENOUS

## 2015-05-07 MED ORDER — ACETAMINOPHEN 650 MG RE SUPP: 650.0000 mg | Freq: Four times a day (QID) | RECTAL | Status: DC | PRN

## 2015-05-07 MED ORDER — HYDROCODONE-ACETAMINOPHEN 5-325 MG PO TABS: 1.0000 | ORAL_TABLET | Freq: Four times a day (QID) | ORAL | Status: DC | PRN

## 2015-05-07 MED ORDER — ACETAMINOPHEN 325 MG PO TABS
650.0000 mg | ORAL_TABLET | Freq: Four times a day (QID) | ORAL | Status: DC | PRN
  Administered 2015-05-08: 650 mg via ORAL
  Filled 2015-05-07: qty 2

## 2015-05-07 MED ORDER — ONDANSETRON HCL 4 MG/2ML IJ SOLN
INTRAMUSCULAR | Status: DC | PRN
  Administered 2015-05-07: 4 mg via INTRAVENOUS

## 2015-05-07 MED ORDER — TRANEXAMIC ACID 1000 MG/10ML IV SOLN
2000.0000 mg | INTRAVENOUS | Status: DC
  Filled 2015-05-07: qty 20

## 2015-05-07 MED ORDER — ALBUTEROL SULFATE (2.5 MG/3ML) 0.083% IN NEBU
2.5000 mg | INHALATION_SOLUTION | RESPIRATORY_TRACT | Status: DC | PRN
  Administered 2015-05-07: 2.5 mg via RESPIRATORY_TRACT
  Filled 2015-05-07: qty 3

## 2015-05-07 MED ORDER — BUPIVACAINE-EPINEPHRINE (PF) 0.5% -1:200000 IJ SOLN
INTRAMUSCULAR | Status: AC
  Filled 2015-05-07: qty 30

## 2015-05-07 MED ORDER — ONDANSETRON HCL 4 MG PO TABS: 4.0000 mg | ORAL_TABLET | Freq: Four times a day (QID) | ORAL | Status: DC | PRN

## 2015-05-07 MED ORDER — LACTATED RINGERS IV SOLN
INTRAVENOUS | Status: DC
  Administered 2015-05-07: 13:00:00 via INTRAVENOUS

## 2015-05-07 MED ORDER — ROCURONIUM BROMIDE 100 MG/10ML IV SOLN
INTRAVENOUS | Status: DC | PRN
  Administered 2015-05-07: 40 mg via INTRAVENOUS

## 2015-05-07 MED ORDER — WARFARIN SODIUM 5 MG PO TABS
2.5000 mg | ORAL_TABLET | Freq: Every evening | ORAL | Status: DC
  Administered 2015-05-07 – 2015-05-10 (×4): 2.5 mg via ORAL
  Filled 2015-05-07 (×4): qty 0.5

## 2015-05-07 MED ORDER — KCL IN DEXTROSE-NACL 20-5-0.45 MEQ/L-%-% IV SOLN: INTRAVENOUS | Status: DC

## 2015-05-07 MED ORDER — CEFUROXIME SODIUM 1.5 G IJ SOLR
INTRAMUSCULAR | Status: AC
  Filled 2015-05-07: qty 1.5

## 2015-05-07 MED ORDER — SODIUM CHLORIDE 0.9 % IV SOLN
10.0000 mg | INTRAVENOUS | Status: DC | PRN
  Administered 2015-05-07: 10 ug/min via INTRAVENOUS

## 2015-05-07 MED ORDER — METOCLOPRAMIDE HCL 5 MG PO TABS: 5.0000 mg | ORAL_TABLET | Freq: Three times a day (TID) | ORAL | Status: DC | PRN

## 2015-05-07 MED ORDER — FENTANYL CITRATE (PF) 100 MCG/2ML IJ SOLN: 25.0000 ug | INTRAMUSCULAR | Status: DC | PRN

## 2015-05-07 MED ORDER — PROPOFOL 10 MG/ML IV BOLUS
INTRAVENOUS | Status: DC | PRN
  Administered 2015-05-07: 50 mg via INTRAVENOUS

## 2015-05-07 MED ORDER — EPHEDRINE SULFATE 50 MG/ML IJ SOLN
INTRAMUSCULAR | Status: DC | PRN
  Administered 2015-05-07: 5 mg via INTRAVENOUS

## 2015-05-07 SURGICAL SUPPLY — 73 items
BIT DRILL 5/64X5 DISP (BIT) ×2 IMPLANT
BLADE SAW SGTL MED 73X18.5 STR (BLADE) ×2 IMPLANT
BRUSH FEMORAL CANAL (MISCELLANEOUS) ×2 IMPLANT
CAPT HIP HEMI 1 ×2 IMPLANT
CEMENT HV SMART SET (Cement) ×4 IMPLANT
CEMENT RESTRICTOR ×2 IMPLANT
COVER BACK TABLE 24X17X13 BIG (DRAPES) IMPLANT
DRAPE IMP U-DRAPE 54X76 (DRAPES) ×2 IMPLANT
DRAPE ORTHO SPLIT 77X108 STRL (DRAPES) ×2
DRAPE PROXIMA HALF (DRAPES) ×2 IMPLANT
DRAPE SURG ORHT 6 SPLT 77X108 (DRAPES) ×2 IMPLANT
DRAPE U-SHAPE 47X51 STRL (DRAPES) ×2 IMPLANT
DRESSING ALLEVYN LIFE SACRUM (GAUZE/BANDAGES/DRESSINGS) ×2 IMPLANT
DRSG AQUACEL AG ADV 3.5X10 (GAUZE/BANDAGES/DRESSINGS) ×2 IMPLANT
DRSG PAD ABDOMINAL 8X10 ST (GAUZE/BANDAGES/DRESSINGS) IMPLANT
DURAPREP 26ML APPLICATOR (WOUND CARE) ×2 IMPLANT
ELECT BLADE 4.0 EZ CLEAN MEGAD (MISCELLANEOUS) ×2
ELECT BLADE 6.5 EXT (BLADE) IMPLANT
ELECT CAUTERY BLADE 6.4 (BLADE) ×2 IMPLANT
ELECT REM PT RETURN 9FT ADLT (ELECTROSURGICAL) ×2
ELECTRODE BLDE 4.0 EZ CLN MEGD (MISCELLANEOUS) ×1 IMPLANT
ELECTRODE REM PT RTRN 9FT ADLT (ELECTROSURGICAL) ×1 IMPLANT
EVACUATOR 1/8 PVC DRAIN (DRAIN) IMPLANT
GAUZE SPONGE 4X4 12PLY STRL (GAUZE/BANDAGES/DRESSINGS) IMPLANT
GAUZE XEROFORM 5X9 LF (GAUZE/BANDAGES/DRESSINGS) IMPLANT
GLOVE BIO SURGEON STRL SZ7.5 (GLOVE) ×2 IMPLANT
GLOVE BIO SURGEON STRL SZ8.5 (GLOVE) ×2 IMPLANT
GLOVE BIOGEL PI IND STRL 6.5 (GLOVE) ×1 IMPLANT
GLOVE BIOGEL PI IND STRL 8 (GLOVE) ×1 IMPLANT
GLOVE BIOGEL PI IND STRL 9 (GLOVE) ×1 IMPLANT
GLOVE BIOGEL PI INDICATOR 6.5 (GLOVE) ×1
GLOVE BIOGEL PI INDICATOR 8 (GLOVE) ×1
GLOVE BIOGEL PI INDICATOR 9 (GLOVE) ×1
GLOVE ECLIPSE 6.5 STRL STRAW (GLOVE) ×2 IMPLANT
GOWN STRL REUS W/ TWL LRG LVL3 (GOWN DISPOSABLE) ×1 IMPLANT
GOWN STRL REUS W/ TWL XL LVL3 (GOWN DISPOSABLE) ×2 IMPLANT
GOWN STRL REUS W/TWL LRG LVL3 (GOWN DISPOSABLE) ×1
GOWN STRL REUS W/TWL XL LVL3 (GOWN DISPOSABLE) ×2
HANDPIECE INTERPULSE COAX TIP (DISPOSABLE) ×1
HOOD PEEL AWAY FACE SHEILD DIS (HOOD) ×2 IMPLANT
HOOD PEEL AWAY FLYTE STAYCOOL (MISCELLANEOUS) ×2 IMPLANT
IMMOBILIZER KNEE 20 (SOFTGOODS) ×2 IMPLANT
KIT BASIN OR (CUSTOM PROCEDURE TRAY) IMPLANT
KIT ROOM TURNOVER OR (KITS) ×2 IMPLANT
MANIFOLD NEPTUNE II (INSTRUMENTS) ×2 IMPLANT
NEEDLE 1/2 CIR MAYO (NEEDLE) IMPLANT
NS IRRIG 1000ML POUR BTL (IV SOLUTION) ×2 IMPLANT
PACK TOTAL JOINT (CUSTOM PROCEDURE TRAY) ×2 IMPLANT
PACK UNIVERSAL I (CUSTOM PROCEDURE TRAY) ×2 IMPLANT
PAD ARMBOARD 7.5X6 YLW CONV (MISCELLANEOUS) ×4 IMPLANT
PASSER SUT SWANSON 36MM LOOP (INSTRUMENTS) IMPLANT
PRESSURIZER FEMORAL UNIV (MISCELLANEOUS) ×2 IMPLANT
SET HNDPC FAN SPRY TIP SCT (DISPOSABLE) ×1 IMPLANT
STAPLER VISISTAT 35W (STAPLE) ×2 IMPLANT
SUCTION FRAZIER HANDLE 10FR (MISCELLANEOUS)
SUCTION TUBE FRAZIER 10FR DISP (MISCELLANEOUS) IMPLANT
SUT ETHIBOND 2 V 37 (SUTURE) ×2 IMPLANT
SUT ETHILON 3 0 FSL (SUTURE) IMPLANT
SUT PASSER 2.0 195M (MISCELLANEOUS) ×2 IMPLANT
SUT VIC AB 0 CT1 27 (SUTURE) ×1
SUT VIC AB 0 CT1 27XBRD ANBCTR (SUTURE) ×1 IMPLANT
SUT VIC AB 1 CTX 36 (SUTURE) ×1
SUT VIC AB 1 CTX36XBRD ANBCTR (SUTURE) ×1 IMPLANT
SUT VIC AB 2-0 CT1 27 (SUTURE) ×1
SUT VIC AB 2-0 CT1 TAPERPNT 27 (SUTURE) ×1 IMPLANT
SUT VIC AB 3-0 PS2 18 (SUTURE) ×1
SUT VIC AB 3-0 PS2 18XBRD (SUTURE) ×1 IMPLANT
SYR CONTROL 10ML LL (SYRINGE) IMPLANT
TOWEL OR 17X24 6PK STRL BLUE (TOWEL DISPOSABLE) ×2 IMPLANT
TOWEL OR 17X26 10 PK STRL BLUE (TOWEL DISPOSABLE) ×2 IMPLANT
TOWER CARTRIDGE SMART MIX (DISPOSABLE) ×2 IMPLANT
TRAY FOLEY CATH 14FR (SET/KITS/TRAYS/PACK) IMPLANT
WATER STERILE IRR 1000ML POUR (IV SOLUTION) ×2 IMPLANT

## 2015-05-07 NOTE — Anesthesia Procedure Notes (Signed)
Procedure Name: Intubation Date/Time: 05/07/2015 2:07 PM Performed by: Marni Griffon Pre-anesthesia Checklist: Emergency Drugs available, Patient identified, Suction available and Patient being monitored Patient Re-evaluated:Patient Re-evaluated prior to inductionOxygen Delivery Method: Circle system utilized Preoxygenation: Pre-oxygenation with 100% oxygen Intubation Type: IV induction Ventilation: Mask ventilation without difficulty Laryngoscope Size: Mac and 4 Grade View: Grade I Tube type: Oral Tube size: 7.5 mm Number of attempts: 1 Airway Equipment and Method: Stylet Placement Confirmation: ETT inserted through vocal cords under direct vision,  breath sounds checked- equal and bilateral and positive ETCO2 Secured at: 21 (cm at gum) cm Tube secured with: Tape Dental Injury: Teeth and Oropharynx as per pre-operative assessment

## 2015-05-07 NOTE — Anesthesia Preprocedure Evaluation (Addendum)
Anesthesia Evaluation  Patient identified by MRN, date of birth, ID band Patient awake    Reviewed: Allergy & Precautions, H&P , NPO status , Patient's Chart, lab work & pertinent test results  Airway Mallampati: II  TM Distance: >3 FB Neck ROM: Full    Dental no notable dental hx. (+) Dental Advisory Given, Edentulous Upper, Edentulous Lower   Pulmonary COPD,    Pulmonary exam normal breath sounds clear to auscultation       Cardiovascular hypertension, Pt. on medications +CHF  + dysrhythmias Atrial Fibrillation  Rhythm:Regular Rate:Normal     Neuro/Psych negative neurological ROS  negative psych ROS   GI/Hepatic Neg liver ROS, GERD  Medicated and Controlled,  Endo/Other  Hypothyroidism   Renal/GU Renal InsufficiencyRenal disease  negative genitourinary   Musculoskeletal   Abdominal   Peds  Hematology negative hematology ROS (+)   Anesthesia Other Findings   Reproductive/Obstetrics negative OB ROS                           Anesthesia Physical Anesthesia Plan  ASA: III  Anesthesia Plan: General   Post-op Pain Management:    Induction: Intravenous  Airway Management Planned: Oral ETT  Additional Equipment:   Intra-op Plan:   Post-operative Plan: Extubation in OR  Informed Consent: I have reviewed the patients History and Physical, chart, labs and discussed the procedure including the risks, benefits and alternatives for the proposed anesthesia with the patient or authorized representative who has indicated his/her understanding and acceptance.   Dental advisory given  Plan Discussed with: CRNA  Anesthesia Plan Comments:         Anesthesia Quick Evaluation

## 2015-05-07 NOTE — Anesthesia Postprocedure Evaluation (Signed)
Anesthesia Post Note  Patient: Jordan [name]  Procedure(s) Performed: Procedure(s) (LRB): LEFT HEMI-HIP ARTHROPLASTY CEMENTED (Left)  Patient location during evaluation: PACU Anesthesia Type: General Level of consciousness: awake and alert Pain management: pain level controlled Vital Signs Assessment: post-procedure vital signs reviewed and stable Respiratory status: spontaneous breathing, nonlabored ventilation, respiratory function stable and patient connected to nasal cannula oxygen Cardiovascular status: blood pressure returned to baseline and stable Postop Assessment: no signs of nausea or vomiting Anesthetic complications: no    Last Vitals:  Filed Vitals:   05/07/15 1730 05/07/15 1745  BP: 137/67 137/60  Pulse: 93 92  Temp:  36.5 C  Resp: 17 19    Last Pain:  Filed Vitals:   05/07/15 1750  PainSc: 0-No pain                 Saveah Bahar,W. EDMOND

## 2015-05-07 NOTE — Progress Notes (Signed)
Called to give short stay report prior to surgery.  Pt has had 2 CHG baths and his PCR sent down which was negative

## 2015-05-07 NOTE — Progress Notes (Signed)
PATIENT DETAILS Name: Jordan [name] Age: 82 y.o. Sex: male Date of Birth: 04-04-1918 Admit Date: 05/06/2015 Admitting Physician Starleen Arms, MD VZD:GLOVFI,EPPIRJ Joelene Millin, MD  Subjective: Left hip pain unchanged.  Assessment/Plan: Closed left hip fracture: Secondary to a mechanical fall. Seen by orthopedics, for left hip hemiarthroplasty today.   Atrial fibrillation: Not on any rate control medications-has not tolerated beta blocker or any rate controlling agents in the past due to severe bradycardia. Resume anticoagulation when okay with orthopedics.  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 Not able to tolerate beta blockers in the past due to bradycardia. Monitor potassium and magnesium closely. Supportive care only.  Leukocytosis: Likely stress/the margination-monitor off antibiotics. Afebrile, UA/chest x-ray negative for infection. Follow  Hypertension: Controlled, continue amlodipine, HCTZ.  Hypothyroidism: Continue levothyroxine  Chronic kidney disease stage III: Creatinine close to usual baseline. Follow  COPD: Currently inactive-lungs are clear. As needed bronchodilators.  Chronic diastolic CHF: Compensated, no signs of volume overload. Be careful with fluids.  GERD: Continue PPI.  Disposition: Remain inpatient  Antimicrobial agents  See below  Anti-infectives    Start     Dose/Rate Route Frequency Ordered Stop   05/07/15 1300  [MAR Hold]  ceFAZolin (ANCEF) IVPB 2 g/50 mL premix     (MAR Hold since 05/07/15 1241)   2 g 100 mL/hr over 30 Minutes Intravenous  Once 05/06/15 1730        DVT Prophylaxis: Defer to Ortho  Code Status: Full code  Family Communication None at bedside  Procedures: None  CONSULTS:  orthopedic surgery  Time spent 30 minutes-Greater than 50% of this time was spent in counseling, explanation of diagnosis, planning of further management, and coordination of  care.  MEDICATIONS: Scheduled Meds: . [MAR Hold] amLODipine  2.5 mg Oral Daily  . [MAR Hold] budesonide (PULMICORT) nebulizer solution  0.25 mg Nebulization BID  . [MAR Hold]  ceFAZolin (ANCEF) IV  2 g Intravenous Once  . [MAR Hold] hydrochlorothiazide  12.5 mg Oral Daily  . [MAR Hold] latanoprost  1 drop Both Eyes QHS  . [MAR Hold] levothyroxine  50 mcg Oral QAC breakfast  . [MAR Hold] pantoprazole  40 mg Oral Daily  . tranexamic acid (CYKLOKAPRON) topical -INTRAOP  2,000 mg Topical To OR   Continuous Infusions: . sodium chloride 50 mL/hr at 05/06/15 1758  . lactated ringers Stopped (05/07/15 1356)   PRN Meds:.0.9 % irrigation (POUR BTL), [MAR Hold] albuterol, [MAR Hold] HYDROcodone-acetaminophen, [MAR Hold] methocarbamol **OR** [MAR Hold] methocarbamol (ROBAXIN)  IV, [MAR Hold]  morphine injection, sodium chloride irrigation    PHYSICAL EXAM: Vital signs in last 24 hours: Filed Vitals:   05/06/15 2039 05/07/15 0701 05/07/15 0859 05/07/15 1156  BP: 167/85 151/80  134/75  Pulse: 95 95  96  Temp: 98.3 F (36.8 C) 98.1 F (36.7 C)  98.6 F (37 C)  TempSrc: Oral Oral    Resp: SpO2: 93% 100% 97% 96%    Weight change:  There were no vitals filed for this visit. There is no weight on file to calculate BMI.   Gen Exam: Awake and alert with clear speech.   Neck: Supple, No JVD.   Chest: B/L Clear.   CVS: S1 S2 irregular, no murmurs.  Abdomen: soft, BS +, non tender, non distended.  Extremities: no edema, lower extremities warm to touch.LLE short and outwardly rotated. Neurologic:  Non Focal.   Skin: No Rash.   Wounds: N/A.    Intake/Output from previous day:  Intake/Output Summary (Last 24 hours) at 05/07/15 1506 Last data filed at 05/07/15 1356  Gross per 24 hour  Intake    200 ml  Output    250 ml  Net    -50 ml     LAB RESULTS: CBC  Recent Labs Lab 05/06/15 1448 05/07/15 0552  WBC 16.1* 15.4*  HGB 15.0 15.6  HCT 41.8 43.9  PLT 216 229  MCV  96.8 96.5  MCH 34.7* 34.3*  MCHC 35.9 35.5  RDW 12.5 12.6  LYMPHSABS 3.1  --   MONOABS 1.1*  --   EOSABS 0.3  --   BASOSABS 0.0  --     Chemistries   Recent Labs Lab 05/06/15 1407 05/07/15 0552  NA 140 137  K 4.7 4.4  CL 108 104  CO2 25 22  GLUCOSE 119* 116*  BUN 34* 28*  CREATININE 1.87* 1.52*  CALCIUM 8.7* 8.8*    CBG: No results for input(s): GLUCAP in the last 168 hours.  GFR CrCl cannot be calculated (Unknown ideal weight.).  Coagulation profile  Recent Labs Lab 05/06/15 1630 05/07/15 0552  INR 2.71* 1.46    Cardiac Enzymes No results for input(s): CKMB, TROPONINI, MYOGLOBIN in the last 168 hours.  Invalid input(s): CK  Invalid input(s): POCBNP No results for input(s): DDIMER in the last 72 hours. No results for input(s): HGBA1C in the last 72 hours. No results for input(s): CHOL, HDL, LDLCALC, TRIG, CHOLHDL, LDLDIRECT in the last 72 hours. No results for input(s): TSH, T4TOTAL, T3FREE, THYROIDAB in the last 72 hours.  Invalid input(s): FREET3 No results for input(s): VITAMINB12, FOLATE, FERRITIN, TIBC, IRON, RETICCTPCT in the last 72 hours. No results for input(s): LIPASE, AMYLASE in the last 72 hours.  Urine Studies No results for input(s): UHGB, CRYS in the last 72 hours.  Invalid input(s): UACOL, UAPR, USPG, UPH, UTP, UGL, UKET, UBIL, UNIT, UROB, ULEU, UEPI, UWBC, URBC, UBAC, CAST, UCOM, BILUA  MICROBIOLOGY: Recent Results (from the past 240 hour(s))  Surgical pcr screen     Status: None   Collection Time: 05/07/15  1:21 AM  Result Value Ref Range Status   MRSA, PCR NEGATIVE NEGATIVE Final   Staphylococcus aureus NEGATIVE NEGATIVE Final    Comment:        The Xpert SA Assay (FDA approved for NASAL specimens in patients over 20 years of age), is one component of a comprehensive surveillance program.  Test performance has been validated by [facility] for patients greater than or equal to 26 year old. It is not intended to diagnose  infection nor to guide or monitor treatment.     RADIOLOGY STUDIES/RESULTS: Dg Chest 1 View  05/06/2015  CLINICAL DATA:  Larey Seat today while leaving church.  Syncopal episode. EXAM: CHEST 1 VIEW COMPARISON:  07/10/2013. FINDINGS: The heart is upper limits of normal in size given the AP projection and supine position of the patient. There is tortuosity and calcification of the thoracic aorta. The lungs demonstrate emphysematous changes and pulmonary scarring with hyperinflation. Stable scarring changes at the right lung base. No acute overlying pulmonary process. The bony thorax is intact. IMPRESSION: Chronic emphysematous changes and pulmonary scarring. No acute pulmonary findings. Electronically Signed   By: Rudie Meyer M.D.   On: 05/06/2015 14:06   Dg Pelvis 1-2 Views  05/06/2015  CLINICAL DATA:  Fall, left hip pain EXAM: PELVIS - 1-2  VIEW COMPARISON:  04/25/2013 FINDINGS: Single frontal view of the pelvis submitted. Again noted right hip prosthesis with anatomic alignment. There is displaced mild impacted fracture of the left femoral neck. Diffuse osteopenia. IMPRESSION: Displaced mild impacted fracture of the left femoral neck. Electronically Signed   By: Natasha Mead M.D.   On: 05/06/2015 13:59   Dg Femur Min 2 Views Left  05/06/2015  CLINICAL DATA:  Left leg gave out.  Pain in left hip. EXAM: LEFT FEMUR 2 VIEWS COMPARISON:  Pelvis 05/06/2015 FINDINGS: Displaced fracture of the proximal left femur at the junction of the left femoral head and neck. This is compatible with a subcapital fracture. Proximal displacement of the distal fragment. Left femoral head is located. Visualized pelvic bony structures are intact. The distal left femur is intact. Patient has a right hip arthroplasty which is partially visualized. IMPRESSION: Subcapital fracture of the proximal left femur. Electronically Signed   By: Richarda Overlie M.D.   On: 05/06/2015 13:58    [name], MD  [facility] Pager:[phone]  [phone]  If 7PM-7AM, please contact night-coverage [email or web address] Password [email or web address] 05/07/2015, 3:06 PM   LOS: 1 day

## 2015-05-07 NOTE — Progress Notes (Signed)
Initial Nutrition Assessment  DOCUMENTATION CODES:   Not applicable  INTERVENTION:   -RD will follow for diet advancement and supplement diet as appropriate  NUTRITION DIAGNOSIS:   Inadequate oral intake related to inability to eat as evidenced by NPO status.  GOAL:   Patient will meet greater than or equal to 90% of their needs  MONITOR:   Diet advancement, Labs, TF tolerance, Skin, I & O's  REASON FOR ASSESSMENT:   Consult Assessment of nutrition requirement/status  ASSESSMENT:   Jordan [name] is a 82 y.o. male, with past medical history significant for COPD, diastolic CHF, atrial fibrillation on Coumadin, chronic kidney disease stage III, hypothyroidism, who presents with complaints of fall in ED, as walking from church to his car, denies any loss of consciousness, dizziness or lightheadedness, fell on concrete, reports significant left hip pain post fall, x-ray showing evidence of left hip fracture, patient reports chronic dyspnea which is about baseline, not hypoxic, there are no respiratory distress, denies any chest pain, palpitation, lab significant for elevated creatinine at baseline, leukocytosis of 16,000, but he is afebrile, INR still pending, denies nausea, vomiting, melena, or hematochezia,, I was called to admit, ED consulted orthopedic service Dr.Rowen.  Pt admitted with closed lt hip fx s/p mechanical fall.   Pt will undergo lt hemiarthroplasty later today.   Hx obtained from pt and family members at bedside. All confirms great appetite PTA. Pt was consuming 3 meals per day PTA- breakfast of eggs, sausage, and cheese toast, lunch and dinner meat, starch, and vegetable. Per pt family members, it has difficulty chewing hard, "stringy" meats. Pt was very active and independent PTA; pt granddaughters share that pt still drives.   Pt denies any weight loss; reports UBW of 185#, which is consistent with wt hx.   Nutrition-Focused physical exam completed. Findings  are no fat depletion, no muscle depletion, and no edema.   Pt understands rationale for NPO diet order. Discussed importance of good PO intake to promote healing. Pt and family expressed appreciation for visit, but have no nutrition-related concerns at this time.   Labs reviewed.   Diet Order:  Diet NPO time specified Except for: Sips with Meds  Skin:  Reviewed, no issues  Last BM:  05/06/15  Height:   Ht Readings from Last 1 Encounters:  10/27/14  (1.727 m)    Weight:   Wt Readings from Last 1 Encounters:  10/27/14 184 lb 12.8 oz (83.825 kg)    Ideal Body Weight:  68.2 kg  BMI:  Estimated body mass index is 28.11 kg/(m^2) as calculated from the following:   Height as of 10/27/14:  (1.727 m).   Weight as of 10/27/14: 184 lb 12.8 oz (83.825 kg).  Estimated Nutritional Needs:   Kcal:  1800-2000  Protein:  90-100 grams  Fluid:  1.8-2.0 L  EDUCATION NEEDS:   Education needs addressed  Jordan [name] A. Mayford Knife, RD, LDN, CDE Pager: [phone] After hours Pager: [phone]

## 2015-05-07 NOTE — Transfer of Care (Signed)
Immediate Anesthesia Transfer of Care Note  Patient: Jordan [name]  Procedure(s) Performed: Procedure(s): LEFT HEMI-HIP ARTHROPLASTY CEMENTED (Left)  Patient Location: PACU  Anesthesia Type:General  Level of Consciousness: awake, sedated, patient cooperative and responds to stimulation  Airway & Oxygen Therapy: Patient Spontanous Breathing and Patient connected to nasal cannula oxygen  Post-op Assessment: Report given to RN, Post -op Vital signs reviewed and stable and Patient moving all extremities  Post vital signs: Reviewed and stable  Last Vitals:  Filed Vitals:   05/07/15 0701 05/07/15 1156  BP: 151/80 134/75  Pulse: 95 96  Temp: 36.7 C 37 C  Resp: 18 18    Complications: No apparent anesthesia complications

## 2015-05-07 NOTE — H&P (View-Only) (Signed)
Reason for Consult: Displaced left femoral neck fracture Referring Physician: Elgergawy  Jordan [name] is an 82 y.o. male.  HPI: 82 year old man who fell onto his left hip today and sustained a displaced left femoral neck fracture. I met him in 2015 when he had a similar 3 to his right hip and underwent a monopolar hemiarthroplasty with out complication. He is on blood thinners and has an INR of 2.7. He also sustained some minor abrasions to the left elbow which has a full range of motion he denies any loss of consciousness.  Past Medical History  Diagnosis Date  . GERD (gastroesophageal reflux disease)   . Hypertension   . COPD (chronic obstructive pulmonary disease) ([place])   . Chronic kidney disease   . Ventricular bigeminy   . PVC (premature ventricular contraction)   . Dyspnea   . Thyroid disease   . CHF (congestive heart failure) [facility])     Past Surgical History  Procedure Laterality Date  . Cholecystectomy    . Splenectomy, partial    . Esophagogastroduodenoscopy N/A 04/26/2013    Procedure: ESOPHAGOGASTRODUODENOSCOPY (EGD);  Surgeon: Cleotis Nipper, MD;  Location: [facility] ENDOSCOPY;  Service: Endoscopy;  Laterality: N/A;  . Esophagogastroduodenoscopy (egd) with esophageal dilation N/A 04/26/2013    Procedure: ESOPHAGOGASTRODUODENOSCOPY (EGD) WITH ESOPHAGEAL DILATION;  Surgeon: Cleotis Nipper, MD;  Location: [place];  Service: Endoscopy;  Laterality: N/A;  . Hip arthroplasty Right 04/25/2013    Procedure: ARTHROPLASTY BIPOLAR HIP;  Surgeon: Kerin Salen, MD;  Location: [place];  Service: Orthopedics;  Laterality: Right;    Family History  Problem Relation Age of Onset  . Hypertension Father     Social History:  reports that he has never smoked. He does not have any smokeless tobacco history on file. He reports that he does not drink alcohol or use illicit drugs.  Allergies:  Allergies  Allergen Reactions  . Diltiazem Hcl Er Beads Other (See Comments)    Tachycardia  .  Metoprolol Succinate Er [Metoprolol Tartrate] Other (See Comments)    hallucinations    Medications: I have reviewed the patient's current medications.  Results for orders placed or performed during the hospital encounter of 05/06/15 (from the past 48 hour(s))  Comprehensive metabolic panel     Status: Abnormal   Collection Time: 05/06/15  2:07 PM  Result Value Ref Range   Sodium 140 135 - 145 mmol/L   Potassium 4.7 3.5 - 5.1 mmol/L    Comment: SPECIMEN HEMOLYZED. HEMOLYSIS MAY AFFECT INTEGRITY OF RESULTS.   Chloride 108 101 - 111 mmol/L   CO2 25 22 - 32 mmol/L   Glucose, Bld 119 (H) 65 - 99 mg/dL   BUN 34 (H) 6 - 20 mg/dL   Creatinine, Ser 1.87 (H) 0.61 - 1.24 mg/dL   Calcium 8.7 (L) 8.9 - 10.3 mg/dL   Total Protein 6.3 (L) 6.5 - 8.1 g/dL   Albumin 3.6 3.5 - 5.0 g/dL   AST 25 15 - 41 U/L   ALT 15 (L) 17 - 63 U/L   Alkaline Phosphatase 87 38 - 126 U/L   Total Bilirubin 0.8 0.3 - 1.2 mg/dL   GFR calc non Af Amer 29 (L) >60 mL/min   GFR calc Af Amer 34 (L) >60 mL/min    Comment: (NOTE) The eGFR has been calculated using the CKD EPI equation. This calculation has not been validated in all clinical situations. eGFR's persistently <60 mL/min signify possible Chronic Kidney Disease.    Anion  gap 7 5 - 15  CBC with Differential     Status: Abnormal   Collection Time: 05/06/15  2:48 PM  Result Value Ref Range   WBC 16.1 (H) 4.0 - 10.5 K/uL   RBC 4.32 4.22 - 5.81 MIL/uL   Hemoglobin 15.0 13.0 - 17.0 g/dL   HCT 41.8 39.0 - 52.0 %   MCV 96.8 78.0 - 100.0 fL   MCH 34.7 (H) 26.0 - 34.0 pg   MCHC 35.9 30.0 - 36.0 g/dL   RDW 12.5 11.5 - 15.5 %   Platelets 216 150 - 400 K/uL   Neutrophils Relative % 72 %   Lymphocytes Relative 19 %   Monocytes Relative 7 %   Eosinophils Relative 2 %   Basophils Relative 0 %   Neutro Abs 11.6 (H) 1.7 - 7.7 K/uL   Lymphs Abs 3.1 0.7 - 4.0 K/uL   Monocytes Absolute 1.1 (H) 0.1 - 1.0 K/uL   Eosinophils Absolute 0.3 0.0 - 0.7 K/uL   Basophils  Absolute 0.0 0.0 - 0.1 K/uL   RBC Morphology HOWELL/JOLLY BODIES     Comment: ACANTHOCYTES  Urinalysis, Routine w reflex microscopic (not at [facility])     Status: None   Collection Time: 05/06/15  4:16 PM  Result Value Ref Range   Color, Urine YELLOW YELLOW   APPearance CLEAR CLEAR   Specific Gravity, Urine 1.018 1.005 - 1.030   pH 5.0 5.0 - 8.0   Glucose, UA NEGATIVE NEGATIVE mg/dL   Hgb urine dipstick NEGATIVE NEGATIVE   Bilirubin Urine NEGATIVE NEGATIVE   Ketones, ur NEGATIVE NEGATIVE mg/dL   Protein, ur NEGATIVE NEGATIVE mg/dL   Nitrite NEGATIVE NEGATIVE   Leukocytes, UA NEGATIVE NEGATIVE    Comment: MICROSCOPIC NOT DONE ON URINES WITH NEGATIVE PROTEIN, BLOOD, LEUKOCYTES, NITRITE, OR GLUCOSE <1000 mg/dL.  I-stat troponin, ED     Status: None   Collection Time: 05/06/15  4:28 PM  Result Value Ref Range   Troponin i, poc 0.01 0.00 - 0.08 ng/mL   Comment 3            Comment: Due to the release kinetics of cTnI, a negative result within the first hours of the onset of symptoms does not rule out myocardial infarction with certainty. If myocardial infarction is still suspected, repeat the test at appropriate intervals.   Protime-INR     Status: Abnormal   Collection Time: 05/06/15  4:30 PM  Result Value Ref Range   Prothrombin Time 28.3 (H) 11.6 - 15.2 seconds   INR 2.71 (H) 0.00 - 1.49    Dg Chest 1 View  05/06/2015  CLINICAL DATA:  [place] today while leaving church.  Syncopal episode. EXAM: CHEST 1 VIEW COMPARISON:  07/10/2013. FINDINGS: The heart is upper limits of normal in size given the AP projection and supine position of the patient. There is tortuosity and calcification of the thoracic aorta. The lungs demonstrate emphysematous changes and pulmonary scarring with hyperinflation. Stable scarring changes at the right lung base. No acute overlying pulmonary process. The bony thorax is intact. IMPRESSION: Chronic emphysematous changes and pulmonary scarring. No acute pulmonary  findings. Electronically Signed   By: Marijo Sanes M.D.   On: 05/06/2015 14:06   Dg Pelvis 1-2 Views  05/06/2015  CLINICAL DATA:  Fall, left hip pain EXAM: PELVIS - 1-2 VIEW COMPARISON:  04/25/2013 FINDINGS: Single frontal view of the pelvis submitted. Again noted right hip prosthesis with anatomic alignment. There is displaced mild impacted fracture of the left  femoral neck. Diffuse osteopenia. IMPRESSION: Displaced mild impacted fracture of the left femoral neck. Electronically Signed   By: Lahoma Crocker M.D.   On: 05/06/2015 13:59   Dg Femur Min 2 Views Left  05/06/2015  CLINICAL DATA:  Left leg gave out.  Pain in left hip. EXAM: LEFT FEMUR 2 VIEWS COMPARISON:  Pelvis 05/06/2015 FINDINGS: Displaced fracture of the proximal left femur at the junction of the left femoral head and neck. This is compatible with a subcapital fracture. Proximal displacement of the distal fragment. Left femoral head is located. Visualized pelvic bony structures are intact. The distal left femur is intact. Patient has a right hip arthroplasty which is partially visualized. IMPRESSION: Subcapital fracture of the proximal left femur. Electronically Signed   By: Markus Daft M.D.   On: 05/06/2015 13:58    ROS patient denies any shortness of breath, denies any chest pain and denies any loss of consciousness. Blood pressure 151/74, pulse 87, temperature 97.2 F (36.2 C), temperature source Oral, resp. rate 20, SpO2 96 %. Physical Exam The left lower extremity is externally rotated 90 shortened 4 cm he can move his toes up and down without difficulty his toes are pink and well-perfused are no cuts scrapes or abrasions over the skin of his left hip no effusion in the left knee. The left elbow does have a superficial abrasion full range of motion. Assessment/Plan: Assessment: Displaced left femoral neck fracture 82 year old man who is on Coumadin for cardiac arrhythmias.  Plan: Will hold Coumadin, tentatively scheduled for  hemiarthroplasty at 1315 hrs. tomorrow 05/07/2015 assuming he is cleared by the hospitalist service. Risks and benefits have been discussed with the patient his wife and family and they are on board with hemiarthroplasty.  Frederik Pear J 05/06/2015, 5:20 PM

## 2015-05-07 NOTE — Discharge Instructions (Addendum)
INSTRUCTIONS AFTER JOINT REPLACEMENT  ° °o Remove items at home which could result in a fall. This includes throw rugs or furniture in walking pathways °o ICE to the affected joint every three hours while awake for 30 minutes at a time, for at least the first 3-5 days, and then as needed for pain and swelling.  Continue to use ice for pain and swelling. You may notice swelling that will progress down to the foot and ankle.  This is normal after surgery.  Elevate your leg when you are not up walking on it.   °o Continue to use the breathing machine you got in the hospital (incentive spirometer) which will help keep your temperature down.  It is common for your temperature to cycle up and down following surgery, especially at night when you are not up moving around and exerting yourself.  The breathing machine keeps your lungs expanded and your temperature down. ° ° °DIET:  As you were doing prior to hospitalization, we recommend a well-balanced diet. ° °DRESSING / WOUND CARE / SHOWERING ° °Keep the surgical dressing until follow up.  The dressing is water proof, so you can shower without any extra covering.  IF THE DRESSING FALLS OFF or the wound gets wet inside, change the dressing with sterile gauze.  Please use good hand washing techniques before changing the dressing.  Do not use any lotions or creams on the incision until instructed by your surgeon.   ° °ACTIVITY ° °o Increase activity slowly as tolerated, but follow the weight bearing instructions below.   °o No driving for 6 weeks or until further direction given by your physician.  You cannot drive while taking narcotics.  °o No lifting or carrying greater than 10 lbs. until further directed by your surgeon. °o Avoid periods of inactivity such as sitting longer than an hour when not asleep. This helps prevent blood clots.  °o You may return to work once you are authorized by your doctor.  ° ° ° °WEIGHT BEARING  ° °Weight bearing as tolerated with assist  device (walker, cane, etc) as directed, use it as long as suggested by your surgeon or therapist, typically at least 4-6 weeks. ° ° °EXERCISES ° °Results after joint replacement surgery are often greatly improved when you follow the exercise, range of motion and muscle strengthening exercises prescribed by your doctor. Safety measures are also important to protect the joint from further injury. Any time any of these exercises cause you to have increased pain or swelling, decrease what you are doing until you are comfortable again and then slowly increase them. If you have problems or questions, call your caregiver or physical therapist for advice.  ° °Rehabilitation is important following a joint replacement. After just a few days of immobilization, the muscles of the leg can become weakened and shrink (atrophy).  These exercises are designed to build up the tone and strength of the thigh and leg muscles and to improve motion. Often times heat used for twenty to thirty minutes before working out will loosen up your tissues and help with improving the range of motion but do not use heat for the first two weeks following surgery (sometimes heat can increase post-operative swelling).  ° °These exercises can be done on a training (exercise) mat, on the floor, on a table or on a bed. Use whatever works the best and is most comfortable for you.    Use music or television while you are exercising so that   the exercises are a pleasant break in your day. This will make your life better with the exercises acting as a break in your routine that you can look forward to.   Perform all exercises about fifteen times, three times per day or as directed.  You should exercise both the operative leg and the other leg as well. ° °Exercises include: °  °• Quad Sets - Tighten up the muscle on the front of the thigh (Quad) and hold for 5-10 seconds.   °• Straight Leg Raises - With your knee straight (if you were given a brace, keep it on),  lift the leg to 60 degrees, hold for 3 seconds, and slowly lower the leg.  Perform this exercise against resistance later as your leg gets stronger.  °• Leg Slides: Lying on your back, slowly slide your foot toward your buttocks, bending your knee up off the floor (only go as far as is comfortable). Then slowly slide your foot back down until your leg is flat on the floor again.  °• Angel Wings: Lying on your back spread your legs to the side as far apart as you can without causing discomfort.  °• Hamstring Strength:  Lying on your back, push your heel against the floor with your leg straight by tightening up the muscles of your buttocks.  Repeat, but this time bend your knee to a comfortable angle, and push your heel against the floor.  You may put a pillow under the heel to make it more comfortable if necessary.  ° °A rehabilitation program following joint replacement surgery can speed recovery and prevent re-injury in the future due to weakened muscles. Contact your doctor or a physical therapist for more information on knee rehabilitation.  ° ° °CONSTIPATION ° °Constipation is defined medically as fewer than three stools per week and severe constipation as less than one stool per week.  Even if you have a regular bowel pattern at home, your normal regimen is likely to be disrupted due to multiple reasons following surgery.  Combination of anesthesia, postoperative narcotics, change in appetite and fluid intake all can affect your bowels.  ° °YOU MUST use at least one of the following options; they are listed in order of increasing strength to get the job done.  They are all available over the counter, and you may need to use some, POSSIBLY even all of these options:   ° °Drink plenty of fluids (prune juice may be helpful) and high fiber foods °Colace 100 mg by mouth twice a day  °Senokot for constipation as directed and as needed Dulcolax (bisacodyl), take with full glass of water  °Miralax (polyethylene glycol)  once or twice a day as needed. ° °If you have tried all these things and are unable to have a bowel movement in the first 3-4 days after surgery call either your surgeon or your primary doctor.   ° °If you experience loose stools or diarrhea, hold the medications until you stool forms back up.  If your symptoms do not get better within 1 week or if they get worse, check with your doctor.  If you experience "the worst abdominal pain ever" or develop nausea or vomiting, please contact the office immediately for further recommendations for treatment. ° ° °ITCHING:  If you experience itching with your medications, try taking only a single pain pill, or even half a pain pill at a time.  You can also use Benadryl over the counter for itching or also to   help with sleep.   TED HOSE STOCKINGS:  Use stockings on both legs until for at least 2 weeks or as directed by physician office. They may be removed at night for sleeping.  MEDICATIONS:  See your medication summary on the After Visit Summary that nursing will review with you.  You may have some home medications which will be placed on hold until you complete the course of blood thinner medication.  It is important for you to complete the blood thinner medication as prescribed.  PRECAUTIONS:  If you experience chest pain or shortness of breath - call 911 immediately for transfer to the hospital emergency department.   If you develop a fever greater that 101 F, purulent drainage from wound, increased redness or drainage from wound, foul odor from the wound/dressing, or calf pain - CONTACT YOUR SURGEON.                                                   FOLLOW-UP APPOINTMENTS:  If you do not already have a post-op appointment, please call the office for an appointment to be seen by your surgeon.  Guidelines for how soon to be seen are listed in your After Visit Summary, but are typically between 1-4 weeks after surgery.  OTHER INSTRUCTIONS:   Knee  Replacement:  Do not place pillow under knee, focus on keeping the knee straight while resting. CPM instructions: 0-90 degrees, 2 hours in the morning, 2 hours in the afternoon, and 2 hours in the evening. Place foam block, curve side up under heel at all times except when in CPM or when walking.  DO NOT modify, tear, cut, or change the foam block in any way.  MAKE SURE YOU:   Understand these instructions.   Get help right away if you are not doing well or get worse.    Thank you for letting us be a part of your medical care team.  It is a privilege we respect greatly.  We hope these instructions will help you stay on track for a fast and full recovery!   ------------------------------------------------------------------------------------------------ Information on my medicine - Coumadin   (Warfarin)  This medication education was reviewed with me or my healthcare representative as part of my discharge preparation.  The pharmacist that spoke with me during my hospital stay was:  Dennie Fetters, [facility]  Why was Coumadin prescribed for you? Coumadin was prescribed for you because you have a blood clot or a medical condition that can cause an increased risk of forming blood clots. Blood clots can cause serious health problems by blocking the flow of blood to the heart, lung, or brain. Coumadin can prevent harmful blood clots from forming. As a reminder your indication for Coumadin is:   Stroke Prevention Because Of Atrial Fibrillation  What test will check on my response to Coumadin? While on Coumadin (warfarin) you will need to have an INR test regularly to ensure that your dose is keeping you in the desired range. The INR (international normalized ratio) number is calculated from the result of the laboratory test called prothrombin time (PT).  If an INR APPOINTMENT HAS NOT ALREADY BEEN MADE FOR YOU please schedule an appointment to have this lab work done by your health care provider  within 7 days. Your INR goal is usually a number between:  2 to  3 or your provider may give you a more narrow range like 2-2.5.  Ask your health care provider during an office visit what your goal INR is.  What  do you need to  know  About  COUMADIN? Take Coumadin (warfarin) exactly as prescribed by your healthcare provider about the same time each day.  DO NOT stop taking without talking to the doctor who prescribed the medication.  Stopping without other blood clot prevention medication to take the place of Coumadin may increase your risk of developing a new clot or stroke.  Get refills before you run out.  What do you do if you miss a dose? If you miss a dose, take it as soon as you remember on the same day then continue your regularly scheduled regimen the next day.  Do not take two doses of Coumadin at the same time.  Important Safety Information A possible side effect of Coumadin (Warfarin) is an increased risk of bleeding. You should call your healthcare provider right away if you experience any of the following: ? Bleeding from an injury or your nose that does not stop. ? Unusual colored urine (red or dark brown) or unusual colored stools (red or black). ? Unusual bruising for unknown reasons. ? A serious fall or if you hit your head (even if there is no bleeding).  Some foods or medicines interact with Coumadin (warfarin) and might alter your response to warfarin. To help avoid this: ? Eat a balanced diet, maintaining a consistent amount of Vitamin K. ? Notify your provider about major diet changes you plan to make. ? Avoid alcohol or limit your intake to 1 drink for women and 2 drinks for men per day. (1 drink is 5 oz. wine, 12 oz. beer, or 1.5 oz. liquor.)  Make sure that ANY health care provider who prescribes medication for you knows that you are taking Coumadin (warfarin).  Also make sure the healthcare provider who is monitoring your Coumadin knows when you have started a new  medication including herbals and non-prescription products.  Coumadin (Warfarin)  Major Drug Interactions  Increased Warfarin Effect Decreased Warfarin Effect  Alcohol (large quantities) Antibiotics (esp. Septra/Bactrim, Flagyl, Cipro) Amiodarone (Cordarone) Aspirin (ASA) Cimetidine (Tagamet) Megestrol (Megace) NSAIDs (ibuprofen, naproxen, etc.) Piroxicam (Feldene) Propafenone (Rythmol SR) Propranolol (Inderal) Isoniazid (INH) Posaconazole (Noxafil) Barbiturates (Phenobarbital) Carbamazepine (Tegretol) Chlordiazepoxide (Librium) Cholestyramine (Questran) Griseofulvin Oral Contraceptives Rifampin Sucralfate (Carafate) Vitamin K   Coumadin (Warfarin) Major Herbal Interactions  Increased Warfarin Effect Decreased Warfarin Effect  Garlic Ginseng Ginkgo biloba Coenzyme Q10 Green tea St. Johns wort    Coumadin (Warfarin) FOOD Interactions  Eat a consistent number of servings per week of foods HIGH in Vitamin K (1 serving =  cup)  Collards (cooked, or boiled & drained) Kale (cooked, or boiled & drained) Mustard greens (cooked, or boiled & drained) Parsley *serving size only =  cup Spinach (cooked, or boiled & drained) Swiss chard (cooked, or boiled & drained) Turnip greens (cooked, or boiled & drained)  Eat a consistent number of servings per week of foods MEDIUM-HIGH in Vitamin K (1 serving = 1 cup)  Asparagus (cooked, or boiled & drained) Broccoli (cooked, boiled & drained, or raw & chopped) Brussel sprouts (cooked, or boiled & drained) *serving size only =  cup Lettuce, raw (green leaf, endive, romaine) Spinach, raw Turnip greens, raw & chopped   These websites have more information on Coumadin (warfarin):  http://www.king-russell.com/; https://www.hines.net/;

## 2015-05-07 NOTE — Op Note (Signed)
Pre Op Dx: L femoral neck fracture   Post Op Dx: Same   Procedure: L hemi-hip arthroplasty using DePuy 53 mm monopolar head, +0 neck, #6 Summit basic stem, 13 mm tip, #4 cement restrictor, double batch Depuy 1 cement with 1500 mg of Zinacef.   Surgeon: Nestor Lewandowsky, MD  Assistant: Tomi Likens. Gaylene Brooks  (present throughout entire procedure and necessary for timely completion of the procedure)   Anesthesia: GET  EBL: 300  Fluids: 1500 cc of crystalloid  Tranexamic Acid: 2gm topical Complicationss:  Indications: Independent living patient slipped and fell at home on the evening of 07/29/2011. Was transported to the emergency room with a presumed L hip fracture confirmed by x-ray. To decrease pain increase function and decreased morbidity have recommended and the patient has consented to hemi-hip arthroplasty. Risks and benefits of surgery have been discussed with the patient and family. All questions have been answered.   Procedure: Patient was identified by arm band receive preoperative IV antibiotics in the holding area at the hospital. Taken to operating room for the appropriate anesthetic monitors were attached and GET  anesthesia induced. Patient was then rolled into the R lateral decubitus position and fixed there with a pelvic clamp. The L lower extremity was then prepped and draped in usual sterile fashion from the ankle to the hemipelvis and a timeout procedure performed. A  14 cm incision centered over the greater trochanter allowing a lateral to posterior lateral approach to the hip was then made through the skin and subcutaneous tissue. Bleeders were sealed with electrocautery. The IT band was cut in line with skin incision at which point we encountered hematoma. Cobra retractors were placed on the superior hip joint capsule and along the inferior hip joint capsule as well. The Piriformis and short external rotators were tagged and cut off their insertion on the intertrochanteric crest  and a posterior capsular flap was developed going posterior superior off the acetabulum out over the fractured femoral neck exiting posterior inferior. This exposed the fracture, the hip was internally rotated and a standard neck cut was performed with an oscillating saw 1 fingerbreadth above the lesser trochanter. This allowed [place] to extracted the femoral head, which measured at 53 mm. Trial reductions were then performed with a 52 and 53 mm trial head on a stick and the 53 mm head had the best fit and fill. There was then flexed and internally rotated exposing the proximal femur which was entered with the initiating reamer, the lateral reamer and then broaches up to a #6 broach which had the best fit and fill to the neck cut. The neck cut was then finished with the neck reamer. And a +0 0 mm trial head was placed on the broach, the hip reduced and stability was found to be excellent he could flex to 90 and 75 of internal rotation and in full extension the hip could not be dislocated in external rotation. We then size for a #4 cement restrictor which was inserted without difficulty and the proximal femur was pulse device clean and dried with suction and sponges. A double batch of DePuy 1 cement was then mixed with 1500 mg of Zinacef and injected into the proximal femur under pressure followed by a #6 Summit basic stem with a 13 mm cementralizer. The stem was held in compression and about 15 of anteversion as the cement cured. A +0 53 mm monopolar head was then hammered onto the stem the hip reduced and stability again  noted to be excellent. The wound is thoroughly irrigated out normal saline solution. Capsular flap and short external rotators repaired back to the intertrochanteric crest a drill holes with a #2 Ethibond suture. The IT band was closed with running #1 Vicryl suture, the subcutaneous tissue with 0 and 2-0 undyed Vicryl suture and the skin with running 3-0 vicryl SQ suture. Aquacil dressing was then  applied. The patient was unclamped rolled supine, awakened extubated and taken to the recovery room without difficulty.

## 2015-05-07 NOTE — Interval H&P Note (Signed)
History and Physical Interval Note:  05/07/2015 1:04 PM  Jordan [name]  has presented today for surgery, with the diagnosis of left femoral neck fracture  The various methods of treatment have been discussed with the patient and family. After consideration of risks, benefits and other options for treatment, the patient has consented to  Procedure(s): LEFT HEMI-HIP ARTHROPLASTY CEMENTED (Left) as a surgical intervention .  The patient's history has been reviewed, patient examined, no change in status, stable for surgery.  I have reviewed the patient's chart and labs.  Questions were answered to the patient's satisfaction.     Nestor Lewandowsky

## 2015-05-07 NOTE — Progress Notes (Signed)
Utilization review completed. Dewayne Jurek, RN, BSN. 

## 2015-05-07 NOTE — Care Management Note (Signed)
Case Management Note  Patient Details  Name: ZAYVEN POWE MRN: [id number] Date of Birth: 03/12/1919  Subjective/Objective:  82 yr old male, admitted s/p fall with left hip fracture.                   Action/Plan:   Expected Discharge Date:                  Expected Discharge Plan:     In-House Referral:     Discharge planning Services     Post Acute Care Choice:    Choice offered to:     DME Arranged:    DME Agency:     HH Arranged:    HH Agency:     Status of Service:   in process  Medicare Important Message Given:    Date Medicare IM Given:    Medicare IM give by:    Date Additional Medicare IM Given:    Additional Medicare Important Message give by:     If discussed at Long Length of Stay Meetings, dates discussed:    Additional Comments: Patient has rolling walker and 3in1 at home.   Durenda Guthrie, RN 05/07/2015, 11:23 AM

## 2015-05-08 DIAGNOSIS — N179 Acute kidney failure, unspecified: Secondary | ICD-10-CM

## 2015-05-08 LAB — CBC
HCT: 36.4 % — ABNORMAL LOW (ref 39.0–52.0)
Hemoglobin: 12.8 g/dL — ABNORMAL LOW (ref 13.0–17.0)
MCH: 34.1 pg — AB (ref 26.0–34.0)
MCHC: 35.2 g/dL (ref 30.0–36.0)
MCV: 97.1 fL (ref 78.0–100.0)
PLATELETS: 196 10*3/uL (ref 150–400)
RBC: 3.75 MIL/uL — ABNORMAL LOW (ref 4.22–5.81)
RDW: 12.6 % (ref 11.5–15.5)
WBC: 17.5 10*3/uL — ABNORMAL HIGH (ref 4.0–10.5)

## 2015-05-08 LAB — PROTIME-INR
INR: 1.15 (ref 0.00–1.49)
PROTHROMBIN TIME: 14.9 s (ref 11.6–15.2)

## 2015-05-08 LAB — BASIC METABOLIC PANEL
Anion gap: 6 (ref 5–15)
BUN: 39 mg/dL — AB (ref 6–20)
CO2: 21 mmol/L — ABNORMAL LOW (ref 22–32)
CREATININE: 2.09 mg/dL — AB (ref 0.61–1.24)
Calcium: 7.9 mg/dL — ABNORMAL LOW (ref 8.9–10.3)
Chloride: 110 mmol/L (ref 101–111)
GFR calc Af Amer: 30 mL/min — ABNORMAL LOW (ref >60)
GFR, EST NON AFRICAN AMERICAN: 26 mL/min — AB (ref >60)
GLUCOSE: 157 mg/dL — AB (ref 65–99)
Potassium: 4.5 mmol/L (ref 3.5–5.1)
SODIUM: 137 mmol/L (ref 135–145)

## 2015-05-08 MED ORDER — SODIUM CHLORIDE 0.9 % IV SOLN: INTRAVENOUS | Status: DC

## 2015-05-08 MED ORDER — POLYETHYLENE GLYCOL 3350 17 G PO PACK
17.0000 g | PACK | Freq: Every day | ORAL | Status: DC
  Administered 2015-05-08 – 2015-05-11 (×4): 17 g via ORAL
  Filled 2015-05-08 (×4): qty 1

## 2015-05-08 NOTE — Progress Notes (Signed)
Physical Therapy Evaluation Patient Details Name: Jordan [name] MRN: [id number] DOB: 1918-06-18 Today's Date: 05/08/2015   History of Present Illness  81 y.o. male with fall resulting in L hip fracture; s/p L hip cemented hemi-arthroplasty. Hx of HTN, CHF, COPD, ventricular bigeminy  Clinical Impression  Patient is seen following the above procedure, presenting with functional limitations due to the deficits listed below (see PT Problem List). Motivated to work with PT. Required +2 assist for bed mobility for the first time. Min-mod assist for stand pivot transfer training but does a good job of pivoting with assist. Anticipate he will be able to ambulate and progress quickly with intensive therapy. Patient will benefit from skilled PT to increase their independence and safety with mobility to allow discharge to the venue listed below.       Follow Up Recommendations CIR - If not a candidate for CIR, pt will require SNF placement.    Equipment Recommendations  None recommended by PT    Recommendations for Other Services OT consult;Rehab consult     Precautions / Restrictions Precautions Precautions: Posterior Hip Precaution Booklet Issued: Yes (comment) Precaution Comments: reviewed handout with pt and daughters Restrictions Weight Bearing Restrictions: Yes LLE Weight Bearing: Weight bearing as tolerated      Mobility  Bed Mobility Overal bed mobility: Needs Assistance Bed Mobility: Supine to Sit     Supine to sit: Mod assist;+2 for physical assistance     General bed mobility comments: Mod assist using helicopter technique with bed pad. VC for sequencing and use of rail. Assist required by +2 for trucal support and LE assist out of bed.  Transfers Overall transfer level: Needs assistance Equipment used: Rolling walker (2 wheeled) Transfers: Sit to/from [employer] Sit to Stand: Mod assist Stand pivot transfers: Min assist       General transfer  comment: Mod assist for boost and balance  with mild posterior lean initially. VC for hand placement. Maintains hip precautions adequately. Small pivotal steps to chair. VC for sequencing. No buckling noted although limited weight-bearing tolerance through LLE. Declines ambulation. Min assist for walker control.  Ambulation/Gait             General Gait Details: declines at this time, will attempt next visit.  Stairs            Wheelchair Mobility    Modified Rankin (Stroke Patients Only)       Balance Overall balance assessment: Needs assistance;History of Falls Sitting-balance support: No upper extremity supported;Feet supported Sitting balance-Leahy Scale: Good     Standing balance support: Single extremity supported Standing balance-Leahy Scale: Poor                               Pertinent Vitals/Pain Pain Assessment: No/denies pain    Home Living Family/patient expects to be discharged to:: Skilled nursing facility Living Arrangements: Children Available Help at Discharge: Family;Available PRN/intermittently (daughter works) Type of Home: House Home Access: Stairs to enter Entrance Stairs-Rails: Doctor, general practice of Steps: 2 Home Layout: Two level;Able to live on main level with bedroom/bathroom Home Equipment: Dan Humphreys - 2 wheels;Shower seat;Cane - single point      Prior Function Level of Independence: Independent with assistive device(s)         Comments: states he uses cane at times     Hand Dominance        Extremity/Trunk Assessment   Upper Extremity Assessment: Defer  to OT evaluation           Lower Extremity Assessment: LLE deficits/detail   LLE Deficits / Details: decreased strength and ROM as expected post op     Communication   Communication: No difficulties  Cognition Arousal/Alertness: Awake/alert Behavior During Therapy: WFL for tasks assessed/performed Overall Cognitive Status: Within  Functional Limits for tasks assessed (cues to recall month)                      General Comments General comments (skin integrity, edema, etc.): Daughters present and very supportive.    Exercises General Exercises - Lower Extremity Ankle Circles/Pumps: AROM;Both;10 reps;Seated Long Arc Quad: Strengthening;Both;10 reps;Seated      Assessment/Plan    PT Assessment Patient needs continued PT services  PT Diagnosis Difficulty walking;Generalized weakness;Acute pain   PT Problem List Decreased strength;Decreased range of motion;Decreased activity tolerance;Decreased balance;Decreased mobility;Decreased knowledge of use of DME;Decreased cognition;Decreased knowledge of precautions;Pain  PT Treatment Interventions DME instruction;Gait training;Functional mobility training;Therapeutic activities;Therapeutic exercise;Balance training;Neuromuscular re-education;Patient/family education;Cognitive remediation   PT Goals (Current goals can be found in the Care Plan section) Acute Rehab PT Goals Patient Stated Goal: Go home soon PT Goal Formulation: With patient Time For Goal Achievement: 05/22/15 Potential to Achieve Goals: Good    Frequency Min 3X/week   Barriers to discharge Decreased caregiver support alone during the day    Co-evaluation               End of Session Equipment Utilized During Treatment: Gait belt Activity Tolerance: Patient tolerated treatment well Patient left: in chair;with call bell/phone within reach;with chair alarm set;with family/visitor present Nurse Communication: Mobility status         Time: 1012-1032 PT Time Calculation (min) (ACUTE ONLY): 20 min   Charges:   PT Evaluation $PT Eval Low Complexity: 1 Procedure     PT G CodesBerton Mount 05/08/2015, 10:52 AM Charlsie Merles, PT [phone]

## 2015-05-08 NOTE — Progress Notes (Signed)
PT Cancellation Note  Patient Details Name: Jordan [name] MRN: [id number] DOB: [birth year]   Cancelled Treatment:    Reason Eval/Treat Not Completed: Patient not medically ready Patient currently with active bed rest order. RN notified, contacting physician. Will check back for PT evaluation when medically ready to work with PT.  Berton Mount 05/08/2015, 9:10 AM  Charlsie Merles, PT [phone]

## 2015-05-08 NOTE — Progress Notes (Signed)
PATIENT DETAILS Name: Jordan [name] Age: 82 y.o. Sex: male Date of Birth: March 29, 1918 Admit Date: 05/06/2015 Admitting Physician Starleen Arms, MD WJX:[id number],NFAOZH Joelene Millin, MD  Subjective: Mild pain at the left hip site-very difficult of hearing-sitting up and eating breakfast this morning  Assessment/Plan: Closed left hip fracture: Secondary to a mechanical fall. Seen by orthopedics, underwent left hip hemiarthroplasty on 2/24. Orthopedics following, recommendations are weightbearing as tolerated, continue warfarin for VTE prophylaxis.  Atrial fibrillation: Not on any rate control medications-has not tolerated beta blocker or any rate controlling agents in the past due to severe bradycardia. On admission Coumadin reversed in the patient for left hip surgery. Coumadin now has been resumed, pharmacy following. Follow INR.   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 Not able to tolerate beta blockers in the past due to bradycardia. Monitor potassium and magnesium closely. Supportive care only.  Leukocytosis: Likely stress/the margination-monitor off antibiotics. Afebrile, UA/chest x-ray negative for infection. Check blood cultures Follow  Hypertension: Controlled, continue amlodipine,   Hypothyroidism: Continue levothyroxine  Acute on Chronic kidney disease stage III: Slight bump in creatinine post surgery-discontinue HCTZ, continue gentle hydration-continue to follow.   COPD: Currently inactive-lungs are clear. As needed bronchodilators.  Chronic diastolic CHF: Compensated, no signs of volume overload. Be careful with fluids.  GERD: Continue PPI.  Disposition: Remain inpatient-SNF vs CIR  Antimicrobial agents  See below  Anti-infectives    Start     Dose/Rate Route Frequency Ordered Stop   05/07/15 1300  [MAR Hold]  ceFAZolin (ANCEF) IVPB 2 g/50 mL premix     (MAR Hold since 05/07/15 1241)   2 g 100 mL/hr over 30  Minutes Intravenous  Once 05/06/15 1730 05/07/15 1445      DVT Prophylaxis: Defer to Ortho  Code Status: Full code  Family Communication None at bedside  Procedures: None  CONSULTS:  orthopedic surgery  Time spent 25 minutes-Greater than 50% of this time was spent in counseling, explanation of diagnosis, planning of further management, and coordination of care.  MEDICATIONS: Scheduled Meds: . amLODipine  2.5 mg Oral Daily  . budesonide (PULMICORT) nebulizer solution  0.25 mg Nebulization BID  . latanoprost  1 drop Both Eyes QHS  . levothyroxine  50 mcg Oral QAC breakfast  . pantoprazole  40 mg Oral Daily  . warfarin  2.5 mg Oral QPM  . Warfarin - Physician Dosing Inpatient   Does not apply q1800   Continuous Infusions: . sodium chloride 20 mL/hr at 05/07/15 2109  . dextrose 5 % and 0.45 % NaCl with KCl 20 mEq/L    . lactated ringers Stopped (05/07/15 1356)   PRN Meds:.acetaminophen **OR** acetaminophen, albuterol, bisacodyl, HYDROcodone-acetaminophen, HYDROcodone-acetaminophen, menthol-cetylpyridinium **OR** phenol, methocarbamol **OR** methocarbamol (ROBAXIN)  IV, metoCLOPramide **OR** metoCLOPramide (REGLAN) injection, morphine injection, morphine injection, ondansetron **OR** ondansetron (ZOFRAN) IV, sodium phosphate    PHYSICAL EXAM: Vital signs in last 24 hours: Filed Vitals:   05/07/15 2132 05/08/15 0102 05/08/15 0511 05/08/15 0729  BP:  116/61 137/74   Pulse:  95 96   Temp:  98.4 F (36.9 C) 97.7 F (36.5 C)   TempSrc:  Oral Oral   Resp:   18   SpO2: 95% 95% 97% 96%    Weight change:  There were no vitals filed for this visit. There is no weight on file to calculate BMI.   Gen Exam: Awake and alert with clear  speech.   Neck: Supple, No JVD.   Chest: B/L Clear.   CVS: S1 S2 irregular, no murmurs.  Abdomen: soft, BS +, non tender, non distended.  Extremities: no edema, lower extremities warm to touch Neurologic: Non Focal.   Skin: No Rash.     Wounds: N/A.    Intake/Output from previous day:  Intake/Output Summary (Last 24 hours) at 05/08/15 1413 Last data filed at 05/08/15 0248  Gross per 24 hour  Intake 1463.5 ml  Output    300 ml  Net 1163.5 ml     LAB RESULTS: CBC  Recent Labs Lab 05/06/15 1448 05/07/15 0552 05/08/15 0516  WBC 16.1* 15.4* 17.5*  HGB 15.0 15.6 12.8*  HCT 41.8 43.9 36.4*  PLT 216 229 196  MCV 96.8 96.5 97.1  MCH 34.7* 34.3* 34.1*  MCHC 35.9 35.5 35.2  RDW 12.5 12.6 12.6  LYMPHSABS 3.1  --   --   MONOABS 1.1*  --   --   EOSABS 0.3  --   --   BASOSABS 0.0  --   --     Chemistries   Recent Labs Lab 05/06/15 1407 05/07/15 0552 05/08/15 0516  NA 140 137 137  K 4.7 4.4 4.5  CL 108 104 110  CO2 25 22 21*  GLUCOSE 119* 116* 157*  BUN 34* 28* 39*  CREATININE 1.87* 1.52* 2.09*  CALCIUM 8.7* 8.8* 7.9*    CBG: No results for input(s): GLUCAP in the last 168 hours.  GFR CrCl cannot be calculated (Unknown ideal weight.).  Coagulation profile  Recent Labs Lab 05/06/15 1630 05/07/15 0552 05/08/15 0516  INR 2.71* 1.46 1.15    Cardiac Enzymes No results for input(s): CKMB, TROPONINI, MYOGLOBIN in the last 168 hours.  Invalid input(s): CK  Invalid input(s): POCBNP No results for input(s): DDIMER in the last 72 hours. No results for input(s): HGBA1C in the last 72 hours. No results for input(s): CHOL, HDL, LDLCALC, TRIG, CHOLHDL, LDLDIRECT in the last 72 hours. No results for input(s): TSH, T4TOTAL, T3FREE, THYROIDAB in the last 72 hours.  Invalid input(s): FREET3 No results for input(s): VITAMINB12, FOLATE, FERRITIN, TIBC, IRON, RETICCTPCT in the last 72 hours. No results for input(s): LIPASE, AMYLASE in the last 72 hours.  Urine Studies No results for input(s): UHGB, CRYS in the last 72 hours.  Invalid input(s): UACOL, UAPR, USPG, UPH, UTP, UGL, UKET, UBIL, UNIT, UROB, ULEU, UEPI, UWBC, URBC, UBAC, CAST, UCOM, BILUA  MICROBIOLOGY: Recent Results (from the past 240  hour(s))  Surgical pcr screen     Status: None   Collection Time: 05/07/15  1:21 AM  Result Value Ref Range Status   MRSA, PCR NEGATIVE NEGATIVE Final   Staphylococcus aureus NEGATIVE NEGATIVE Final    Comment:        The Xpert SA Assay (FDA approved for NASAL specimens in patients over 57 years of age), is one component of a comprehensive surveillance program.  Test performance has been validated by [facility] for patients greater than or equal to 13 year old. It is not intended to diagnose infection nor to guide or monitor treatment.     RADIOLOGY STUDIES/RESULTS: Dg Chest 1 View  05/06/2015  CLINICAL DATA:  Larey Seat today while leaving church.  Syncopal episode. EXAM: CHEST 1 VIEW COMPARISON:  07/10/2013. FINDINGS: The heart is upper limits of normal in size given the AP projection and supine position of the patient. There is tortuosity and calcification of the thoracic aorta. The lungs demonstrate emphysematous  changes and pulmonary scarring with hyperinflation. Stable scarring changes at the right lung base. No acute overlying pulmonary process. The bony thorax is intact. IMPRESSION: Chronic emphysematous changes and pulmonary scarring. No acute pulmonary findings. Electronically Signed   By: Rudie Meyer M.D.   On: 05/06/2015 14:06   Dg Pelvis 1-2 Views  05/06/2015  CLINICAL DATA:  Fall, left hip pain EXAM: PELVIS - 1-2 VIEW COMPARISON:  04/25/2013 FINDINGS: Single frontal view of the pelvis submitted. Again noted right hip prosthesis with anatomic alignment. There is displaced mild impacted fracture of the left femoral neck. Diffuse osteopenia. IMPRESSION: Displaced mild impacted fracture of the left femoral neck. Electronically Signed   By: Natasha Mead M.D.   On: 05/06/2015 13:59   Pelvis Portable  05/07/2015  CLINICAL DATA:  Closed left hip fracture EXAM: PORTABLE PELVIS 1-2 VIEWS COMPARISON:  05/06/2015 FINDINGS: Single frontal view of the pelvis submitted. There is new left hip  prosthesis with anatomic alignment. Stable right hip prosthesis. Small amount of postsurgical periarticular soft tissue air left hip region. IMPRESSION: New left hip prosthesis with anatomic alignment. Stable right hip prosthesis. Postsurgical changes left hip region. Electronically Signed   By: Natasha Mead M.D.   On: 05/07/2015 16:53   Dg Femur Min 2 Views Left  05/06/2015  CLINICAL DATA:  Left leg gave out.  Pain in left hip. EXAM: LEFT FEMUR 2 VIEWS COMPARISON:  Pelvis 05/06/2015 FINDINGS: Displaced fracture of the proximal left femur at the junction of the left femoral head and neck. This is compatible with a subcapital fracture. Proximal displacement of the distal fragment. Left femoral head is located. Visualized pelvic bony structures are intact. The distal left femur is intact. Patient has a right hip arthroplasty which is partially visualized. IMPRESSION: Subcapital fracture of the proximal left femur. Electronically Signed   By: Richarda Overlie M.D.   On: 05/06/2015 13:58    [name], MD  [facility] Pager:[phone]  If 7PM-7AM, please contact night-coverage [email or web address] Password [email or web address] 05/08/2015, 2:13 PM   LOS: 2 days

## 2015-05-08 NOTE — [place] FL2 (Signed)
[place] MEDICAID FL2 LEVEL OF CARE SCREENING TOOL     IDENTIFICATION  Patient Name: Jordan [name] Birthdate: Aug 07, 1918 Sex: male Admission Date (Current Location): 05/06/2015  [facility] and [place] Number:  Producer, television/film/video and Address:  [facility] [place], [address] [place], [place] [address]      Provider Number: [id number]  Attending Physician Name and Address:  Maretta Bees, MD  Relative Name and Phone Number:       Current Level of Care: Hospital Recommended Level of Care: Skilled Nursing Facility Prior Approval Number:    Date Approved/Denied:   PASRR Number: [id number]  Discharge Plan: SNF    Current Diagnoses: Patient Active Problem List   Diagnosis Date Noted  . Femur fracture, left (HCC) 05/07/2015  . Hip fracture (HCC) 05/06/2015  . Faintness 09/16/2013  . 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 Syncope 07/10/2013  . Pneumonia 05/23/2013  . Dysphagia, unspecified(787.20) 05/09/2013  . Acute blood loss anemia 05/08/2013  . Food impaction of esophagus 04/26/2013  . Closed right hip fracture (HCC) 04/24/2013  . Chronic kidney disease (CKD) stage G3a/A1 04/24/2013  . CAP (community acquired pneumonia) 04/24/2013  . Leukocytosis 04/24/2013  . Encounter for therapeutic drug monitoring 04/18/2013  . COPD (chronic obstructive pulmonary disease) (HCC) 01/01/2013  . Chronic anticoagulation 01/01/2013  . Chronic diastolic heart failure (HCC) 01/01/2013  . Diarrhea 01/01/2013  . Atrial fibrillation (HCC) 12/18/2012  . Bradycardia 12/16/2012  . Hypertension 12/16/2012  . Hypothyroidism 12/16/2012  . Acute kidney failure (HCC) 12/16/2012    Orientation RESPIRATION BLADDER Height & Weight     Self,Place, Situation   O2 (at 2L) Incontinent Weight:   Height:     BEHAVIORAL SYMPTOMS/MOOD NEUROLOGICAL BOWEL NUTRITION STATUS   (NONE )  (NONE ) Continent Diet (Heart Healthy)  AMBULATORY STATUS COMMUNICATION OF NEEDS Skin   Extensive  Assist Verbally Surgical wounds                       Personal Care Assistance Level of Assistance  Bathing, Dressing, Feeding Bathing Assistance: Maximum assistance Feeding assistance: Limited assistance Dressing Assistance: Maximum assistance     Functional Limitations Info  Sight, Hearing, Speech Sight Info: Adequate Hearing Info: Adequate Speech Info: Adequate    SPECIAL CARE FACTORS FREQUENCY  PT (By licensed PT), OT (By licensed OT)     PT Frequency: 3              Contractures      Additional Factors Info  Code Status, Allergies Code Status Info: FULL CODE  Allergies Info: Diltiazem Hcl Er Beads, Metoprolol Succinate Er           Current Medications (05/08/2015):  This is the current hospital active medication list Current Facility-Administered Medications  Medication Dose Route Frequency Provider Last Rate Last Dose  . 0.9 %  sodium chloride infusion   Intravenous Continuous Maretta Bees, MD      . acetaminophen (TYLENOL) tablet 650 mg  650 mg Oral Q6H PRN Allena Katz, PA-C   650 mg at 05/08/15 1012   Or  . acetaminophen (TYLENOL) suppository 650 mg  650 mg Rectal Q6H PRN Allena Katz, PA-C      . albuterol (PROVENTIL) (2.5 MG/3ML) 0.083% nebulizer solution 2.5 mg  2.5 mg Nebulization Q4H PRN Leda Gauze, NP   2.5 mg at 05/07/15 0352  . amLODipine (NORVASC) tablet 2.5 mg  2.5 mg Oral Daily Dawood S Elgergawy,  MD   2.5 mg at 05/08/15 1012  . bisacodyl (DULCOLAX) EC tablet 5 mg  5 mg Oral Daily PRN Allena Katz, PA-C      . budesonide (PULMICORT) nebulizer solution 0.25 mg  0.25 mg Nebulization BID Starleen Arms, MD   0.25 mg at 05/08/15 0729  . HYDROcodone-acetaminophen (NORCO/VICODIN) 5-325 MG per tablet 1-2 tablet  1-2 tablet Oral Q6H PRN Allena Katz, PA-C      . latanoprost (XALATAN) 0.005 % ophthalmic solution 1 drop  1 drop Both Eyes QHS Starleen Arms, MD   1 drop at 05/07/15 2323  . levothyroxine (SYNTHROID,  LEVOTHROID) tablet 50 mcg  50 mcg Oral QAC breakfast Starleen Arms, MD   50 mcg at 05/08/15 1012  . menthol-cetylpyridinium (CEPACOL) lozenge 3 mg  1 lozenge Oral PRN Allena Katz, PA-C       Or  . phenol (CHLORASEPTIC) mouth spray 1 spray  1 spray Mouth/Throat PRN Allena Katz, PA-C      . methocarbamol (ROBAXIN) tablet 500 mg  500 mg Oral Q6H PRN Starleen Arms, MD       Or  . methocarbamol (ROBAXIN) 500 mg in dextrose 5 % 50 mL IVPB  500 mg Intravenous Q6H PRN Starleen Arms, MD      . metoCLOPramide (REGLAN) tablet 5-10 mg  5-10 mg Oral Q8H PRN Allena Katz, PA-C       Or  . metoCLOPramide (REGLAN) injection 5-10 mg  5-10 mg Intravenous Q8H PRN Allena Katz, PA-C      . morphine 2 MG/ML injection 0.5 mg  0.5 mg Intravenous Q2H PRN Starleen Arms, MD      . morphine 2 MG/ML injection 0.5 mg  0.5 mg Intravenous Q2H PRN Allena Katz, PA-C      . ondansetron [facility]) tablet 4 mg  4 mg Oral Q6H PRN Allena Katz, PA-C       Or  . ondansetron [facility]) injection 4 mg  4 mg Intravenous Q6H PRN Allena Katz, PA-C      . pantoprazole (PROTONIX) EC tablet 40 mg  40 mg Oral Daily Starleen Arms, MD   40 mg at 05/08/15 1012  . polyethylene glycol (MIRALAX / GLYCOLAX) packet 17 g  17 g Oral Daily Shanker Levora Dredge, MD      . sodium phosphate (FLEET) 7-19 GM/118ML enema 1 enema  1 enema Rectal Once PRN Allena Katz, PA-C      . warfarin (COUMADIN) tablet 2.5 mg  2.5 mg Oral QPM Allena Katz, PA-C   2.5 mg at 05/07/15 2322  . Warfarin - Physician Dosing Inpatient   Does not apply [id number] Maretta Bees, MD         Discharge Medications: Please see discharge summary for a list of discharge medications.  Relevant Imaging Results:  Relevant Lab Results:   Additional Information SSN [id number]  Derenda Fennel, MSW, LCSWA [phone] 05/08/2015 4:23 PM

## 2015-05-08 NOTE — Clinical Social Work Note (Signed)
Clinical Social Work Assessment  Patient Details  Name: Jordan [name] MRN: [id number] Date of Birth: May 30, 1918  Date of referral:  05/08/15               Reason for consult:  Facility Placement                Permission sought to share information with:  Family Supports Permission granted to share information::  Yes, Verbal Permission Granted  Name::     Elissa Hefty  Relationship::  Daughter   Contact Information:  [phone]  Housing/Transportation Living arrangements for the past 2 months:  [place] of Information:  Patient, Adult Children Patient Interpreter Needed:  None Criminal Activity/Legal Involvement Pertinent to Current Situation/Hospitalization:  No - Comment as needed Significant Relationships:  Adult Children Lives with:  Adult Children Do you feel safe going back to the place where you live?  Yes Need for family participation in patient care:  Yes (Comment)  Care giving concerns:  Patient daughter at bedside states that there are 7 children and plenty of hands, but everyone is still working and no one is available to provide 24 hours support to patient at home.   Social Worker assessment / plan:  Holiday representative met with patient and family at bedside to offer support and discuss patient needs at discharge.  Patient states that he lives at home with his youngest daughter, however she is a Pharmacist, hospital and is gone all day.  Patient has 7 children who all live in the area, but with limited abilities to provide adequate assistance.  Patient has been to [facility] in the past and would like to return if possible.  Patient and family understanding and agreeable with a county SNF search with preference to [facility].  CSW to initiate search and follow up with patient family regarding available bed offers.  CSW remains available for support and to facilitate patient discharge needs once medically stable.  Employment status:  Retired IT sales professional PT Recommendations:  [place]patient [place], [place] / Referral to community resources:  [place]  Patient/Family's Response to care:  Patient and family at bedside all verbalized understanding of CSW role and agreement for SNF placement at discharge.  Patient family remains interested in the potential for CIR, however are aware that due to no 24 hour support at discharge, potential may be limited.  Patient/Family's Understanding of and Emotional Response to Diagnosis, Current Treatment, and Prognosis:  Patient and family went through a similar injury and rehab need about two years ago and are very understanding of process.  Patient turning 89 in April and has plans to live to 89.  Emotional Assessment Appearance:  Appears younger than stated age Attitude/Demeanor/Rapport:  Inconsistent (Engaged and Cooperative) Affect (typically observed):  Appropriate, Calm, Happy, Hopeful Orientation:  Oriented to Self, Oriented to Place, Oriented to Situation, Oriented to  Time Alcohol / Substance use:  Not Applicable Psych involvement (Current and /or in the community):  No (Comment)  Discharge Needs  Concerns to be addressed:  Discharge Planning Concerns Readmission within the last 30 days:  No Current discharge risk:  None Barriers to Discharge:  Continued Medical Work up   [employer], [place]

## 2015-05-08 NOTE — Progress Notes (Signed)
Rehab Admissions Coordinator Note:  Patient was screened by Trish Mage for appropriateness for an Inpatient Acute Rehab Consult.  At this time, an inpatient rehab consult has been ordered.  We will follow up after rehab consult is completed.  Trish Mage 05/08/2015, 5:48 PM  I can be reached at [phone].

## 2015-05-08 NOTE — Clinical Social Work Placement (Signed)
   CLINICAL SOCIAL WORK PLACEMENT  NOTE  Date:  05/08/2015  Patient Details  Name: Jordan [name] MRN: [id number] Date of Birth: March 01, 1919  Clinical Social Work is seeking post-discharge placement for this patient at the Skilled  Nursing Facility level of care (*CSW will initial, date and re-position this form in  chart as items are completed):  Yes   Patient/family provided with [facility] Clinical Social Work Department's list of facilities offering this level of care within the geographic area requested by the patient (or if unable, by the patient's family).  Yes   Patient/family informed of their freedom to choose among providers that offer the needed level of care, that participate in Medicare, Medicaid or managed care program needed by the patient, have an available bed and are willing to accept the patient.  Yes   Patient/family informed of [facility]'s ownership interest in [facility] and [facility], as well as of the fact that they are under no obligation to receive care at these facilities.  PASRR submitted to EDS on       PASRR number received on       Existing PASRR number confirmed on 05/08/15     FL2 transmitted to all facilities in geographic area requested by pt/family on 05/08/15     FL2 transmitted to all facilities within larger geographic area on       Patient informed that his/her managed care company has contracts with or will negotiate with certain facilities, including the following:            Patient/family informed of bed offers received.  Patient chooses bed at       Physician recommends and patient chooses bed at      Patient to be transferred to   on  .  Patient to be transferred to facility by       Patient family notified on   of transfer.  Name of family member notified:        PHYSICIAN Please sign FL2     Additional Comment:   Patient would like to return to [facility] if possible  Macario Golds, LCSW [phone]

## 2015-05-08 NOTE — Progress Notes (Signed)
Orthopedic Tech Progress Note Patient Details:  [name] [birth year] [id number]  Patient ID: Jordan [name], male   DOB: 1919/02/22, 82 y.o.   MRN: [id number]   [name] 05/08/2015, 10:44 AMTrapeze bar

## 2015-05-08 NOTE — Progress Notes (Addendum)
Subjective: 05-07-15: L hip cemented hemi-arthroplasty, WBAT Patient sitting in bed, tol clear liquid diet for breakfast   Objective: Vital signs in last 24 hours: Temp:  [97.6 F (36.4 C)-98.6 F (37 C)] 97.7 F (36.5 C) (02/25 0511) Pulse Rate:  [85-97] 96 (02/25 0511) Resp:  [16-21] 18 (02/25 0511) BP: (113-157)/(60-91) 137/74 mmHg (02/25 0511) SpO2:  [91 %-100 %] 96 % (02/25 0729) FiO2 (%):  [2 %] 2 % (02/24 2132)  Intake/Output from previous day: 02/24 0701 - 02/25 0700 In: 1663.5 [I.V.:1663.5] Out: 450 [Urine:150; Blood:300] Intake/Output this shift:     Recent Labs  05/06/15 1448 05/07/15 0552 05/08/15 0516  HGB 15.0 15.6 12.8*    Recent Labs  05/07/15 0552 05/08/15 0516  WBC 15.4* 17.5*  RBC 4.55 3.75*  HCT 43.9 36.4*  PLT 229 196    Recent Labs  05/07/15 0552 05/08/15 0516  NA 137 137  K 4.4 4.5  CL 104 110  CO2 22 21*  BUN 28* 39*  CREATININE 1.52* 2.09*  GLUCOSE 116* 157*  CALCIUM 8.8* 7.9*    Recent Labs  05/07/15 0552 05/08/15 0516  INR 1.46 1.15    Dressing clean/dry externally L LE: Flex/ext toes and ankle, sens intact to LT P/D/1web  Assessment/Plan: WBAT L LE PT VTE: Warfarin and SCD Placement Can d/c anytime per ortho once arrangements made   Jackqulyn Mendel A. 05/08/2015, 9:23 AM

## 2015-05-09 ENCOUNTER — Inpatient Hospital Stay (HOSPITAL_COMMUNITY): Payer: Medicare Other

## 2015-05-09 LAB — PROTIME-INR
INR: 1.09 (ref 0.00–1.49)
PROTHROMBIN TIME: 14.3 s (ref 11.6–15.2)

## 2015-05-09 LAB — BASIC METABOLIC PANEL
Anion gap: 9 (ref 5–15)
BUN: 40 mg/dL — ABNORMAL HIGH (ref 6–20)
CALCIUM: 7.9 mg/dL — AB (ref 8.9–10.3)
CO2: 22 mmol/L (ref 22–32)
CREATININE: 1.89 mg/dL — AB (ref 0.61–1.24)
Chloride: 108 mmol/L (ref 101–111)
GFR calc non Af Amer: 29 mL/min — ABNORMAL LOW (ref >60)
GFR, EST AFRICAN AMERICAN: 33 mL/min — AB (ref >60)
Glucose, Bld: 101 mg/dL — ABNORMAL HIGH (ref 65–99)
Potassium: 3.9 mmol/L (ref 3.5–5.1)
SODIUM: 139 mmol/L (ref 135–145)

## 2015-05-09 LAB — CBC
HCT: 32.9 % — ABNORMAL LOW (ref 39.0–52.0)
Hemoglobin: 12 g/dL — ABNORMAL LOW (ref 13.0–17.0)
MCH: 35.2 pg — ABNORMAL HIGH (ref 26.0–34.0)
MCHC: 36.5 g/dL — AB (ref 30.0–36.0)
MCV: 96.5 fL (ref 78.0–100.0)
Platelets: 182 10*3/uL (ref 150–400)
RBC: 3.41 MIL/uL — ABNORMAL LOW (ref 4.22–5.81)
RDW: 12.7 % (ref 11.5–15.5)
WBC: 20.4 10*3/uL — ABNORMAL HIGH (ref 4.0–10.5)

## 2015-05-09 LAB — URINALYSIS, ROUTINE W REFLEX MICROSCOPIC
Bilirubin Urine: NEGATIVE
GLUCOSE, UA: NEGATIVE mg/dL
KETONES UR: NEGATIVE mg/dL
LEUKOCYTES UA: NEGATIVE
Nitrite: NEGATIVE
PH: 5 (ref 5.0–8.0)
Protein, ur: NEGATIVE mg/dL
SPECIFIC GRAVITY, URINE: 1.017 (ref 1.005–1.030)

## 2015-05-09 LAB — URINE MICROSCOPIC-ADD ON

## 2015-05-09 LAB — MAGNESIUM: Magnesium: 1.5 mg/dL — ABNORMAL LOW (ref 1.7–2.4)

## 2015-05-09 MED ORDER — POTASSIUM CHLORIDE CRYS ER 20 MEQ PO TBCR
20.0000 meq | EXTENDED_RELEASE_TABLET | Freq: Once | ORAL | Status: AC
  Administered 2015-05-09: 20 meq via ORAL
  Filled 2015-05-09: qty 1

## 2015-05-09 MED ORDER — LEVALBUTEROL HCL 0.63 MG/3ML IN NEBU: 0.6300 mg | INHALATION_SOLUTION | Freq: Three times a day (TID) | RESPIRATORY_TRACT | Status: DC

## 2015-05-09 MED ORDER — MAGNESIUM SULFATE 2 GM/50ML IV SOLN
2.0000 g | Freq: Once | INTRAVENOUS | Status: AC
  Administered 2015-05-09: 2 g via INTRAVENOUS
  Filled 2015-05-09: qty 50

## 2015-05-09 MED ORDER — LEVALBUTEROL HCL 0.63 MG/3ML IN NEBU
0.6300 mg | INHALATION_SOLUTION | Freq: Three times a day (TID) | RESPIRATORY_TRACT | Status: DC
  Administered 2015-05-09 (×2): 0.63 mg via RESPIRATORY_TRACT
  Filled 2015-05-09 (×2): qty 3

## 2015-05-09 NOTE — Progress Notes (Signed)
Subjective: 05-07-15: L hip cemented hemi-arthroplasty, WBAT Patient sitting in bed, O2 sats were a little down so getting O2 via facemask, resp. Therapy in room   Objective: Vital signs in last 24 hours: Temp:  [97.6 F (36.4 C)-97.8 F (36.6 C)] 97.6 F (36.4 C) (02/26 0551) Pulse Rate:  [81-89] 81 (02/26 0551) Resp:  [18-19] 18 (02/26 0551) BP: (131-139)/(53-71) 139/71 mmHg (02/26 0551) SpO2:  [88 %-93 %] 88 % (02/26 0944)  Intake/Output from previous day: 02/25 0701 - 02/26 0700 In: -  Out: 400 [Urine:400] Intake/Output this shift: Total I/O In: 120 [P.O.:120] Out: 100 [Urine:100]   Recent Labs  05/06/15 1448 05/07/15 0552 05/08/15 0516 05/09/15 0348  HGB 15.0 15.6 12.8* 12.0*    Recent Labs  05/08/15 0516 05/09/15 0348  WBC 17.5* 20.4*  RBC 3.75* 3.41*  HCT 36.4* 32.9*  PLT 196 182    Recent Labs  05/08/15 0516 05/09/15 0348  NA 137 139  K 4.5 3.9  CL 110 108  CO2 21* 22  BUN 39* 40*  CREATININE 2.09* 1.89*  GLUCOSE 157* 101*  CALCIUM 7.9* 7.9*    Recent Labs  05/08/15 0516 05/09/15 0348  INR 1.15 1.09    Dressing clean/dry externally No redness, no significant spotting thru dressing strip L LE: Flex/ext toes and ankle, sens intact to LT P/D/1web  Assessment/Plan: WBAT L LE PT VTE: Warfarin and SCD WBC up to 20, not likely hip as source Placement--rehab vs. SNF Can d/c anytime per ortho once arrangements made/medically stable.   Birdella Sippel A. 05/09/2015, 9:49 AM

## 2015-05-09 NOTE — Progress Notes (Signed)
Occupational Therapy Evaluation Patient Details Name: ROSCO HARRIOTT MRN: [id number] DOB: September 17, 1918 Today's Date: 05/09/2015    History of Present Illness 82 y.o. male with fall resulting in L hip fracture; s/p L hip cemented hemi-arthroplasty. Hx of HTN, CHF, COPD, ventricular bigeminy   Clinical Impression   PTA, pt was independent with ADL and mobility, drove and was very active. Pt currently mod A with mobility @ RW level to follow posterior hip precautions and mod A with LB ADL.Marland Kitchen Confused at times - family reports most likely due to morphine as pt does not have cognitive deficits at baseline. Good family support. Pt will benefit from post acute rehab. Family would like CIR if it's an option, if not, family would like [employer] if available. Will follow acutely to address established goals.     Follow Up Recommendations  CIR;Supervision/Assistance - 24 hour    Equipment Recommendations  3 in 1 bedside comode;Tub/shower bench    Recommendations for Other Services Rehab consult     Precautions / Restrictions Precautions Precautions: Posterior Hip Precaution Booklet Issued: Yes (comment) Precaution Comments: reviewed handout with pt and daughters Restrictions Weight Bearing Restrictions: Yes LLE Weight Bearing: Weight bearing as tolerated      Mobility Bed Mobility Overal bed mobility: Needs Assistance Bed Mobility: Supine to Sit     Supine to sit: Mod assist        Transfers Overall transfer level: Needs assistance Equipment used: Rolling walker (2 wheeled)   Sit to Stand: Mod assist Stand pivot transfers: Min assist       General transfer comment: no posterior lean noted in pm session. More alert and oriented once upright and after moblity.    Balance     Sitting balance-Leahy Scale: Good       Standing balance-Leahy Scale: Poor                              ADL Overall ADL's : Needs assistance/impaired     Grooming: Set  up;Supervision/safety;Sitting   Upper Body Bathing: Set up;Supervision/ safety;Sitting   Lower Body Bathing: Sit to/from stand;Moderate assistance   Upper Body Dressing : Minimal assistance;Sitting   Lower Body Dressing: Maximal assistance;Sit to/from stand   Toilet Transfer: Moderate assistance (simulated)   Toileting- Clothing Manipulation and Hygiene: Moderate assistance       Functional mobility during ADLs: Moderate assistance;Rolling walker;Cueing for safety;Cueing for sequencing General ADL Comments: Pt unable to remember posterior hip precuations at this time.      Vision     Perception     Praxis      Pertinent Vitals/Pain Pain Assessment: Faces Faces Pain Scale: Hurts little more Pain Location: L hip Pain Descriptors / Indicators: Sore;Grimacing Pain Intervention(s): Limited activity within patient's tolerance;Repositioned     Hand Dominance     Extremity/Trunk Assessment Upper Extremity Assessment Upper Extremity Assessment: Overall WFL for tasks assessed   Lower Extremity Assessment Lower Extremity Assessment: Defer to PT evaluation LLE Deficits / Details: decreased strength and ROM as expected post op LLE: Unable to fully assess due to immobilization   Cervical / Trunk Assessment Cervical / Trunk Assessment: Kyphotic   Communication Communication Communication: HOH   Cognition Arousal/Alertness: Awake/alert Behavior During Therapy: WFL for tasks assessed/performed Overall Cognitive Status: Impaired/Different from baseline (cues to recall month) Area of Impairment: Memory               General Comments: Pt appears confused  at times. Family states it is due to morphine. At baselin, pt is congitively intact   General Comments       Exercises Exercises: General Lower Extremity     Shoulder Instructions      Home Living Family/patient expects to be discharged to:: Skilled nursing facility Living Arrangements: Children Available Help  at Discharge: Family;Available PRN/intermittently (daughter works) Type of Home: House Home Access: Stairs to enter [employer] of Steps: 2 Entrance Stairs-Rails: Right;Left Home Layout: Two level;Able to live on main level with bedroom/bathroom               Home Equipment: Dan Humphreys - 2 wheels;Shower seat;Cane - single point          Prior Functioning/Environment Level of Independence: Independent with assistive device(s)        Comments: states he uses cane at times; drove    OT Diagnosis: Generalized weakness;Acute pain;Altered mental status   OT Problem List: Decreased strength;Decreased range of motion;Decreased activity tolerance;Impaired balance (sitting and/or standing);Decreased safety awareness;Decreased knowledge of use of DME or AE;Decreased knowledge of precautions;Pain   OT Treatment/Interventions: Self-care/ADL training;Therapeutic exercise;DME and/or AE instruction;Therapeutic activities;Patient/family education;Balance training    OT Goals(Current goals can be found in the care plan section) Acute Rehab OT Goals Patient Stated Goal: Go home soon OT Goal Formulation: With patient/family Time For Goal Achievement: 05/16/15 Potential to Achieve Goals: Good ADL Goals Pt Will Perform Lower Body Bathing: with adaptive equipment;sit to/from stand;with caregiver independent in assisting;with supervision Pt Will Perform Lower Body Dressing: with supervision;with adaptive equipment;with caregiver independent in assisting;sit to/from stand Pt Will Transfer to Toilet: with modified independence;ambulating;bedside commode (RW) Pt Will Perform Toileting - Clothing Manipulation and hygiene: with modified independence;sit to/from stand Additional ADL Goal #1: Pt/family will independently verbalize 3/3 posterior hip precautions  OT Frequency: Min 2X/week   Barriers to D/C:            Co-evaluation              End of Session Equipment Utilized During  Treatment: Gait belt;Rolling walker;Oxygen (2L) Nurse Communication: Mobility status;Precautions;Weight bearing status  Activity Tolerance: Patient tolerated treatment well Patient left: in chair;with call bell/phone within reach;with chair alarm set;with family/visitor present   Time: 4098-1191 OT Time Calculation (min): 24 min Charges:  OT General Charges $OT Visit: 1 Procedure OT Evaluation $OT Eval Moderate Complexity: 1 Procedure OT Treatments $Self Care/Home Management : 8-22 mins G-Codes:    Duriel Deery,HILLARY 2015-05-10, 4:57 PM   [facility], OTR/L  [phone] 10-May-2015

## 2015-05-09 NOTE — Progress Notes (Signed)
PATIENT DETAILS Name: Jordan [name] Age: 82 y.o. Sex: male Date of Birth: [birth year] Admit Date: 05/06/2015 Admitting Physician Starleen Arms, MD [id number] Joelene Millin, MD  Subjective: Pain better-no fever.  Assessment/Plan: Closed left hip fracture: Secondary to a mechanical fall. Seen by orthopedics, underwent left hip hemiarthroplasty on 2/24. Orthopedics following, recommendations are weightbearing as tolerated, continue warfarin for VTE prophylaxis.  Atrial fibrillation: Not on any rate control medications-has not tolerated beta blocker or any rate controlling agents in the past due to severe bradycardia. On admission Coumadin reversed in the patient for left hip surgery. Coumadin now has been resumed, pharmacy following. Follow INR.   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 Not able to tolerate beta blockers in the past due to bradycardia. Monitor potassium and magnesium closely. Supportive care only.  Leukocytosis: Likely stress/demargination. Afebrile, UA/chest x-ray negative for infection. Await blood cultures. Since leukocytosis worsening-we will recheck UA, chest x-ray. Looks nontoxic-continue to monitor off antibiotics. Follow  Hypertension: Controlled, continue amlodipine,   Hypothyroidism: Continue levothyroxine  Acute on Chronic kidney disease stage III: Slight bump in creatinine post surgery-managed with IV fluids-creatinine back to baseline. Continue to hold HCTZ.  COPD: Minimal wheezing today-start bronchodilators. Follow.   Chronic diastolic CHF: Compensated, no signs of volume overload. Be careful with fluids.  GERD: Continue PPI.  Disposition: Remain inpatient-SNF vs CIR  Antimicrobial agents  See below  Anti-infectives    Start     Dose/Rate Route Frequency Ordered Stop   05/07/15 1300  [MAR Hold]  ceFAZolin (ANCEF) IVPB 2 g/50 mL premix     (MAR Hold since 05/07/15 1241)   2 g 100 mL/hr  over 30 Minutes Intravenous  Once 05/06/15 1730 05/07/15 1445      DVT Prophylaxis: Defer to Ortho  Code Status: Full code  Family Communication Daughter at bedside  Procedures: None  CONSULTS:  orthopedic surgery  Time spent 25 minutes-Greater than 50% of this time was spent in counseling, explanation of diagnosis, planning of further management, and coordination of care.  MEDICATIONS: Scheduled Meds: . amLODipine  2.5 mg Oral Daily  . budesonide (PULMICORT) nebulizer solution  0.25 mg Nebulization BID  . latanoprost  1 drop Both Eyes QHS  . levalbuterol  0.63 mg Nebulization Q8H  . levothyroxine  50 mcg Oral QAC breakfast  . magnesium sulfate 1 - 4 g bolus IVPB  2 g Intravenous Once  . pantoprazole  40 mg Oral Daily  . polyethylene glycol  17 g Oral Daily  . warfarin  2.5 mg Oral QPM  . Warfarin - Physician Dosing Inpatient   Does not apply q1800   Continuous Infusions:   PRN Meds:.acetaminophen **OR** acetaminophen, albuterol, bisacodyl, HYDROcodone-acetaminophen, menthol-cetylpyridinium **OR** phenol, methocarbamol **OR** methocarbamol (ROBAXIN)  IV, metoCLOPramide **OR** metoCLOPramide (REGLAN) injection, morphine injection, morphine injection, ondansetron **OR** ondansetron (ZOFRAN) IV, sodium phosphate    PHYSICAL EXAM: Vital signs in last 24 hours: Filed Vitals:   05/08/15 1949 05/08/15 2138 05/09/15 0551 05/09/15 0944  BP:  131/53 139/71   Pulse:  89 81   Temp:  97.8 F (36.6 C) 97.6 F (36.4 C)   TempSrc:  Oral Oral   Resp:  19 18   SpO2: 92% 93% 93% 88%    Weight change:  There were no vitals filed for this visit. There is no weight on file to calculate BMI.   Gen Exam: Awake and alert with  clear speech.   Neck: Supple, No JVD.   Chest: Scattered rhonchi-good air entry bilaterally CVS: S1 S2 irregular, no murmurs.  Abdomen: soft, BS +, non tender, non distended.  Extremities: no edema, lower extremities warm to touch Neurologic: Non Focal.     Skin: No Rash.   Wounds: N/A.    Intake/Output from previous day:  Intake/Output Summary (Last 24 hours) at 05/09/15 1320 Last data filed at 05/09/15 0900  Gross per 24 hour  Intake    120 ml  Output    500 ml  Net   -380 ml     LAB RESULTS: CBC  Recent Labs Lab 05/06/15 1448 05/07/15 0552 05/08/15 0516 05/09/15 0348  WBC 16.1* 15.4* 17.5* 20.4*  HGB 15.0 15.6 12.8* 12.0*  HCT 41.8 43.9 36.4* 32.9*  PLT 216 229 196 182  MCV 96.8 96.5 97.1 96.5  MCH 34.7* 34.3* 34.1* 35.2*  MCHC 35.9 35.5 35.2 36.5*  RDW 12.5 12.6 12.6 12.7  LYMPHSABS 3.1  --   --   --   MONOABS 1.1*  --   --   --   EOSABS 0.3  --   --   --   BASOSABS 0.0  --   --   --     Chemistries   Recent Labs Lab 05/06/15 1407 05/07/15 0552 05/08/15 0516 05/09/15 0348 05/09/15 0950  NA 140 137 137 139  --   K 4.7 4.4 4.5 3.9  --   CL 108 104 110 108  --   CO2 25 22 21* 22  --   GLUCOSE 119* 116* 157* 101*  --   BUN 34* 28* 39* 40*  --   CREATININE 1.87* 1.52* 2.09* 1.89*  --   CALCIUM 8.7* 8.8* 7.9* 7.9*  --   MG  --   --   --   --  1.5*    CBG: No results for input(s): GLUCAP in the last 168 hours.  GFR CrCl cannot be calculated (Unknown ideal weight.).  Coagulation profile  Recent Labs Lab 05/06/15 1630 05/07/15 0552 05/08/15 0516 05/09/15 0348  INR 2.71* 1.46 1.15 1.09    Cardiac Enzymes No results for input(s): CKMB, TROPONINI, MYOGLOBIN in the last 168 hours.  Invalid input(s): CK  Invalid input(s): POCBNP No results for input(s): DDIMER in the last 72 hours. No results for input(s): HGBA1C in the last 72 hours. No results for input(s): CHOL, HDL, LDLCALC, TRIG, CHOLHDL, LDLDIRECT in the last 72 hours. No results for input(s): TSH, T4TOTAL, T3FREE, THYROIDAB in the last 72 hours.  Invalid input(s): FREET3 No results for input(s): VITAMINB12, FOLATE, FERRITIN, TIBC, IRON, RETICCTPCT in the last 72 hours. No results for input(s): LIPASE, AMYLASE in the last 72  hours.  Urine Studies No results for input(s): UHGB, CRYS in the last 72 hours.  Invalid input(s): UACOL, UAPR, USPG, UPH, UTP, UGL, UKET, UBIL, UNIT, UROB, ULEU, UEPI, UWBC, URBC, UBAC, CAST, UCOM, BILUA  MICROBIOLOGY: Recent Results (from the past 240 hour(s))  Surgical pcr screen     Status: None   Collection Time: 05/07/15  1:21 AM  Result Value Ref Range Status   MRSA, PCR NEGATIVE NEGATIVE Final   Staphylococcus aureus NEGATIVE NEGATIVE Final    Comment:        The Xpert SA Assay (FDA approved for NASAL specimens in patients over 32 years of age), is one component of a comprehensive surveillance program.  Test performance has been validated by [facility] for patients greater than  or equal to 36 year old. It is not intended to diagnose infection nor to guide or monitor treatment.     RADIOLOGY STUDIES/RESULTS: Dg Chest 1 View  05/06/2015  CLINICAL DATA:  Larey Seat today while leaving church.  Syncopal episode. EXAM: CHEST 1 VIEW COMPARISON:  07/10/2013. FINDINGS: The heart is upper limits of normal in size given the AP projection and supine position of the patient. There is tortuosity and calcification of the thoracic aorta. The lungs demonstrate emphysematous changes and pulmonary scarring with hyperinflation. Stable scarring changes at the right lung base. No acute overlying pulmonary process. The bony thorax is intact. IMPRESSION: Chronic emphysematous changes and pulmonary scarring. No acute pulmonary findings. Electronically Signed   By: Rudie Meyer M.D.   On: 05/06/2015 14:06   Dg Pelvis 1-2 Views  05/06/2015  CLINICAL DATA:  Fall, left hip pain EXAM: PELVIS - 1-2 VIEW COMPARISON:  04/25/2013 FINDINGS: Single frontal view of the pelvis submitted. Again noted right hip prosthesis with anatomic alignment. There is displaced mild impacted fracture of the left femoral neck. Diffuse osteopenia. IMPRESSION: Displaced mild impacted fracture of the left femoral neck. Electronically  Signed   By: Natasha Mead M.D.   On: 05/06/2015 13:59   Pelvis Portable  05/07/2015  CLINICAL DATA:  Closed left hip fracture EXAM: PORTABLE PELVIS 1-2 VIEWS COMPARISON:  05/06/2015 FINDINGS: Single frontal view of the pelvis submitted. There is new left hip prosthesis with anatomic alignment. Stable right hip prosthesis. Small amount of postsurgical periarticular soft tissue air left hip region. IMPRESSION: New left hip prosthesis with anatomic alignment. Stable right hip prosthesis. Postsurgical changes left hip region. Electronically Signed   By: Natasha Mead M.D.   On: 05/07/2015 16:53   Dg Femur Min 2 Views Left  05/06/2015  CLINICAL DATA:  Left leg gave out.  Pain in left hip. EXAM: LEFT FEMUR 2 VIEWS COMPARISON:  Pelvis 05/06/2015 FINDINGS: Displaced fracture of the proximal left femur at the junction of the left femoral head and neck. This is compatible with a subcapital fracture. Proximal displacement of the distal fragment. Left femoral head is located. Visualized pelvic bony structures are intact. The distal left femur is intact. Patient has a right hip arthroplasty which is partially visualized. IMPRESSION: Subcapital fracture of the proximal left femur. Electronically Signed   By: Richarda Overlie M.D.   On: 05/06/2015 13:58    [name], MD  [facility] Pager:[phone]  If 7PM-7AM, please contact night-coverage [email or web address] Password [email or web address] 05/09/2015, 1:20 PM   LOS: 3 days

## 2015-05-09 NOTE — Progress Notes (Signed)
MD notified of increased number of PVCs (non-perfusing beats).  Noted to have 7 beats during the night.  NNO.

## 2015-05-09 NOTE — Progress Notes (Signed)
Physical Therapy Treatment Patient Details Name: Jordan [name] MRN: [id number] DOB: [birth year] Today's Date: 05/09/2015    History of Present Illness 82 y.o. male with fall resulting in L hip fracture; s/p L hip cemented hemi-arthroplasty. Hx of HTN, CHF, COPD, ventricular bigeminy    PT Comments    Patient progressing steadily with mobility.  Did much better today - able to ambulate a short distance.  Feel patent could reach independence to return home with short term rehab stay.  Will continue to follow to progress independence and mobility.   Follow Up Recommendations  CIR     Equipment Recommendations  None recommended by PT    Recommendations for Other Services Rehab consult;OT consult     Precautions / Restrictions Precautions Precautions: Posterior Hip Restrictions LLE Weight Bearing: Weight bearing as tolerated    Mobility  Bed Mobility Overal bed mobility: Needs Assistance Bed Mobility: Supine to Sit     Supine to sit: Min assist;+2 for physical assistance     General bed mobility comments: assist for supine scooting to EOB; assist to lift shoulders off bed and trunk support  Transfers Overall transfer level: Needs assistance Equipment used: Rolling walker (2 wheeled) Transfers: Sit to/from Stand Sit to Stand: Min assist;+2 physical assistance         General transfer comment: assist to power up and verbal cues for hand placement.    Ambulation/Gait Ambulation/Gait assistance: Min assist Ambulation Distance (Feet): 25 Feet Assistive device: Rolling walker (2 wheeled) Gait Pattern/deviations: Step-to pattern;Decreased stride length;Antalgic;Decreased stance time - left         Stairs            Wheelchair Mobility    Modified Rankin (Stroke Patients Only)       Balance Overall balance assessment: Needs assistance Sitting-balance support: No upper extremity supported Sitting balance-Leahy Scale: Fair     Standing balance  support: Bilateral upper extremity supported Standing balance-Leahy Scale: Poor Standing balance comment: reliant on RW for support                    Cognition Arousal/Alertness: Awake/alert Behavior During Therapy: WFL for tasks assessed/performed Overall Cognitive Status: Within Functional Limits for tasks assessed                      Exercises Total Joint Exercises Ankle Circles/Pumps: AROM;Both;10 reps;Seated Quad Sets: AROM;Both;10 reps;Seated    General Comments        Pertinent Vitals/Pain Pain Assessment: 0-10 Pain Score: 4  Pain Location: Left hip Pain Descriptors / Indicators: Sore;Discomfort Pain Intervention(s): Monitored during session    Home Living                      Prior Function            PT Goals (current goals can now be found in the care plan section) Progress towards PT goals: Progressing toward goals    Frequency  Min 3X/week    PT Plan Current plan remains appropriate    Co-evaluation             End of Session Equipment Utilized During Treatment: Gait belt Activity Tolerance: Patient tolerated treatment well;No increased pain Patient left: in chair;with family/visitor present;with call bell/phone within reach     Time: 1037-1056 PT Time Calculation (min) (ACUTE ONLY): 19 min  Charges:  $Gait Training: 8-22 mins  G CodesOlivia Canter 05/27/15, 11:22 AM  2015/05/27 Corlis Hove, PT [phone]

## 2015-05-10 ENCOUNTER — Inpatient Hospital Stay (HOSPITAL_COMMUNITY): Payer: Medicare Other

## 2015-05-10 DIAGNOSIS — J441 Chronic obstructive pulmonary disease with (acute) exacerbation: Secondary | ICD-10-CM

## 2015-05-10 LAB — CBC WITH DIFFERENTIAL/PLATELET
BASOS PCT: 0 %
Basophils Absolute: 0 10*3/uL (ref 0.0–0.1)
Eosinophils Absolute: 1 10*3/uL — ABNORMAL HIGH (ref 0.0–0.7)
Eosinophils Relative: 6 %
HEMATOCRIT: 33.4 % — AB (ref 39.0–52.0)
HEMOGLOBIN: 11.8 g/dL — AB (ref 13.0–17.0)
LYMPHS ABS: 3.1 10*3/uL (ref 0.7–4.0)
LYMPHS PCT: 20 %
MCH: 34.1 pg — ABNORMAL HIGH (ref 26.0–34.0)
MCHC: 35.3 g/dL (ref 30.0–36.0)
MCV: 96.5 fL (ref 78.0–100.0)
MONO ABS: 2 10*3/uL — AB (ref 0.1–1.0)
MONOS PCT: 13 %
NEUTROS ABS: 9.6 10*3/uL — AB (ref 1.7–7.7)
NEUTROS PCT: 61 %
Platelets: 208 10*3/uL (ref 150–400)
RBC: 3.46 MIL/uL — ABNORMAL LOW (ref 4.22–5.81)
RDW: 12.8 % (ref 11.5–15.5)
WBC: 15.6 10*3/uL — ABNORMAL HIGH (ref 4.0–10.5)

## 2015-05-10 LAB — BASIC METABOLIC PANEL
ANION GAP: 10 (ref 5–15)
BUN: 34 mg/dL — ABNORMAL HIGH (ref 6–20)
BUN: 34 mg/dL — AB (ref 4–21)
CHLORIDE: 105 mmol/L (ref 101–111)
CO2: 23 mmol/L (ref 22–32)
Calcium: 8 mg/dL — ABNORMAL LOW (ref 8.9–10.3)
Creatinine, Ser: 1.68 mg/dL — ABNORMAL HIGH (ref 0.61–1.24)
Creatinine: 1.7 mg/dL — AB (ref 0.6–1.3)
GFR calc non Af Amer: 33 mL/min — ABNORMAL LOW (ref >60)
GFR, EST AFRICAN AMERICAN: 38 mL/min — AB (ref >60)
GLUCOSE: 106 mg/dL
GLUCOSE: 106 mg/dL — AB (ref 65–99)
POTASSIUM: 4.1 mmol/L (ref 3.5–5.1)
SODIUM: 138 mmol/L (ref 137–147)
Sodium: 138 mmol/L (ref 135–145)

## 2015-05-10 LAB — PROTIME-INR
INR: 1.14 (ref 0.00–1.49)
PROTHROMBIN TIME: 14.8 s (ref 11.6–15.2)

## 2015-05-10 LAB — MAGNESIUM: Magnesium: 2 mg/dL (ref 1.7–2.4)

## 2015-05-10 LAB — BRAIN NATRIURETIC PEPTIDE: B NATRIURETIC PEPTIDE 5: 167.3 pg/mL — AB (ref 0.0–100.0)

## 2015-05-10 LAB — CBC AND DIFFERENTIAL: WBC: 15.6 10*3/mL

## 2015-05-10 MED ORDER — HYDROCHLOROTHIAZIDE 25 MG PO TABS
12.5000 mg | ORAL_TABLET | Freq: Every day | ORAL | Status: DC
  Administered 2015-05-10 – 2015-05-11 (×2): 12.5 mg via ORAL
  Filled 2015-05-10 (×2): qty 1

## 2015-05-10 MED ORDER — METHYLPREDNISOLONE SODIUM SUCC 125 MG IJ SOLR
60.0000 mg | Freq: Once | INTRAMUSCULAR | Status: AC
  Administered 2015-05-10: 60 mg via INTRAVENOUS
  Filled 2015-05-10: qty 2

## 2015-05-10 MED ORDER — GUAIFENESIN ER 600 MG PO TB12
600.0000 mg | ORAL_TABLET | Freq: Two times a day (BID) | ORAL | Status: DC
  Administered 2015-05-10 – 2015-05-11 (×3): 600 mg via ORAL
  Filled 2015-05-10 (×3): qty 1

## 2015-05-10 MED ORDER — IPRATROPIUM BROMIDE 0.02 % IN SOLN: 0.5000 mg | Freq: Four times a day (QID) | RESPIRATORY_TRACT | Status: DC

## 2015-05-10 MED ORDER — LEVALBUTEROL HCL 0.63 MG/3ML IN NEBU
0.6300 mg | INHALATION_SOLUTION | Freq: Two times a day (BID) | RESPIRATORY_TRACT | Status: DC
  Administered 2015-05-11: 0.63 mg via RESPIRATORY_TRACT
  Filled 2015-05-10: qty 3

## 2015-05-10 MED ORDER — FUROSEMIDE 10 MG/ML IJ SOLN
20.0000 mg | Freq: Once | INTRAMUSCULAR | Status: AC
  Administered 2015-05-10: 20 mg via INTRAVENOUS
  Filled 2015-05-10: qty 2

## 2015-05-10 MED ORDER — LEVALBUTEROL HCL 0.63 MG/3ML IN NEBU
0.6300 mg | INHALATION_SOLUTION | Freq: Four times a day (QID) | RESPIRATORY_TRACT | Status: DC
  Administered 2015-05-10 (×3): 0.63 mg via RESPIRATORY_TRACT
  Filled 2015-05-10 (×4): qty 3

## 2015-05-10 MED ORDER — IPRATROPIUM BROMIDE 0.02 % IN SOLN
0.2500 mg | Freq: Four times a day (QID) | RESPIRATORY_TRACT | Status: DC
Start: 2015-05-10 — End: 2015-05-10
  Filled 2015-05-10: qty 2.5

## 2015-05-10 MED ORDER — ALBUTEROL SULFATE (2.5 MG/3ML) 0.083% IN NEBU: 2.5000 mg | INHALATION_SOLUTION | RESPIRATORY_TRACT | Status: DC | PRN

## 2015-05-10 MED ORDER — IPRATROPIUM BROMIDE 0.02 % IN SOLN
0.5000 mg | Freq: Four times a day (QID) | RESPIRATORY_TRACT | Status: DC
  Administered 2015-05-10 (×3): 0.5 mg via RESPIRATORY_TRACT
  Filled 2015-05-10 (×2): qty 2.5

## 2015-05-10 MED ORDER — IPRATROPIUM BROMIDE 0.02 % IN SOLN
0.5000 mg | Freq: Two times a day (BID) | RESPIRATORY_TRACT | Status: DC
  Administered 2015-05-11: 0.5 mg via RESPIRATORY_TRACT
  Filled 2015-05-10: qty 2.5

## 2015-05-10 NOTE — Care Management Important Message (Signed)
Important Message  Patient Details  Name: Jordan [name] MRN: [id number] Date of Birth: [birth year]   Medicare Important Message Given:  Yes    Giancarlo Askren P Yesenia Fontenette 05/10/2015, 4:46 PM

## 2015-05-10 NOTE — Clinical Social Work Note (Signed)
CSW met with patient and his family, CSW presented bed offers, they have chosen [facility].  CSW spoke to [facility] and they said they can take patient tomorrow if he is medically ready for discharge and orders have been received.  CSW to continue to follow patient's progress.  Jones Broom. [place], MSW, [place] 05/10/2015 1:02 PM

## 2015-05-10 NOTE — Progress Notes (Signed)
Notified by ortho CM, Renee Anguilli, that pt to be discharged to [facility]. We will hold on rehab consult at this time. [phone]

## 2015-05-10 NOTE — Progress Notes (Signed)
Physical Therapy Treatment Patient Details Name: Jordan [name] MRN: [id number] DOB: [birth year] Today's Date: 05/10/2015    History of Present Illness 82 y.o. male with fall resulting in L hip fracture; s/p L hip cemented hemi-arthroplasty. Hx of HTN, CHF, COPD, ventricular bigeminy    PT Comments    Patient is progressing toward mobility goals with increased activity tolerance and improved mobility this session. Tolerated exercises well. Recalled 2/3 hip precautions at end of session. Family present at end of session. Continue to progress as tolerated.   Follow Up Recommendations  CIR     Equipment Recommendations  None recommended by PT    Recommendations for Other Services Rehab consult;OT consult     Precautions / Restrictions Precautions Precautions: Posterior Hip Precaution Booklet Issued: Yes (comment) Precaution Comments: reviewed handout with pt and daughters Restrictions Weight Bearing Restrictions: Yes LLE Weight Bearing: Weight bearing as tolerated    Mobility  Bed Mobility Overal bed mobility: Needs Assistance Bed Mobility: Supine to Sit     Supine to sit: Min assist;Mod assist;HOB elevated     General bed mobility comments: assist to elevate trunk into sitting, bring L LE to EOB, and scoot hips forward with use of bed pad; vc for sequencing and technique  Transfers Overall transfer level: Needs assistance Equipment used: Rolling walker (2 wheeled) Transfers: Sit to/from Stand Sit to Stand: Min assist         General transfer comment: assist to power up into standing; increased time to achieve upright posture; vc for hand placement  Ambulation/Gait Ambulation/Gait assistance: Min guard Ambulation Distance (Feet): 55 Feet Assistive device: Rolling walker (2 wheeled) Gait Pattern/deviations: Step-to pattern;Decreased stance time - left;Decreased step length - right;Trunk flexed;Narrow base of support     General Gait Details: cues for sequencing,  position of RW, and posture; pt with very narrow BOS and almost scissoring at times especially when becoming fatigued   Stairs            Wheelchair Mobility    Modified Rankin (Stroke Patients Only)       Balance Overall balance assessment: Needs assistance Sitting-balance support: Feet supported;No upper extremity supported Sitting balance-Leahy Scale: Good     Standing balance support: Bilateral upper extremity supported Standing balance-Leahy Scale: Poor                      Cognition Arousal/Alertness: Awake/alert Behavior During Therapy: WFL for tasks assessed/performed Overall Cognitive Status: Within Functional Limits for tasks assessed Area of Impairment: Memory                    Exercises Total Joint Exercises Ankle Circles/Pumps: AROM;Both;15 reps General Exercises - Lower Extremity Long Arc Quad: Strengthening;Seated;15 reps;Left Hip ABduction/ADduction: AROM;Left;15 reps;Seated Hip Flexion/Marching: AROM;Both;10 reps;Seated    General Comments        Pertinent Vitals/Pain Pain Assessment: Faces Faces Pain Scale: Hurts little more Pain Location: L hip with mobility Pain Descriptors / Indicators: Grimacing;Sore Pain Intervention(s): Limited activity within patient's tolerance;Monitored during session;Premedicated before session;Repositioned    Home Living                      Prior Function            PT Goals (current goals can now be found in the care plan section) Acute Rehab PT Goals Patient Stated Goal: Go home soon Progress towards PT goals: Progressing toward goals    Frequency  Min 3X/week  PT Plan Current plan remains appropriate    Co-evaluation             End of Session Equipment Utilized During Treatment: Gait belt Activity Tolerance: Patient tolerated treatment well;No increased pain Patient left: in chair;with family/visitor present;with call bell/phone within reach     Time:  1142-1204 PT Time Calculation (min) (ACUTE ONLY): 22 min  Charges:  $Gait Training: 8-22 mins                    G Codes:      Derek [place], PTA Pager: [phone]   05/10/2015, 12:32 PM

## 2015-05-10 NOTE — Progress Notes (Signed)
PATIENT DETAILS Name: Jordan [name] Age: 82 y.o. Sex: male Date of Birth: 09/20/1918 Admit Date: 05/06/2015 Admitting Physician Starleen Arms, MD ION:[id number] Joelene Millin, MD  Subjective: Wheezing today. Pain controlled  Assessment/Plan: Closed left hip fracture: Secondary to a mechanical fall. Seen by orthopedics, underwent left hip hemiarthroplasty on 2/24. Orthopedics following, recommendations are weightbearing as tolerated, continue warfarin for VTE prophylaxis.  Atrial fibrillation: Not on any rate control medications-has not tolerated beta blocker or any rate controlling agents in the past due to severe bradycardia. On admission Coumadin reversed in the patient for left hip surgery. Coumadin now has been resumed, pharmacy following. Follow INR-remains subtherapeutic.   Mild COPD exacerbation: continue Nebs-start short course of steroids. Incentive spirometry/flutter valve.  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 Not able to tolerate beta blockers in the past due to bradycardia. Monitor potassium and magnesium closely. Supportive care only.  Leukocytosis: Likely stress/demargination.Leuckotyosis now down trending. Afebrile, UA/chest x-ray negative for infection,blood cultures negative as well.   Looks nontoxic-continue to monitor off antibiotics. Follow  Hypertension: Controlled, continue amlodipine,   Hypothyroidism: Continue levothyroxine  Acute on Chronic kidney disease stage III: Slight bump in creatinine post surgery-managed with IV fluids-creatinine back to baseline. Resume HCTZ.  Chronic diastolic CHF: Compensated, no signs of volume overload. Be careful with fluids.  GERD: Continue PPI.  Disposition: Remain inpatient-SNF tomorrow  Antimicrobial agents  See below  Anti-infectives    Start     Dose/Rate Route Frequency Ordered Stop   05/07/15 1300  [MAR Hold]  ceFAZolin (ANCEF) IVPB 2 g/50 mL premix      (MAR Hold since 05/07/15 1241)   2 g 100 mL/hr over 30 Minutes Intravenous  Once 05/06/15 1730 05/07/15 1445      DVT Prophylaxis: Defer to Ortho  Code Status: Full code  Family Communication Daughter at bedside  Procedures: None  CONSULTS:  orthopedic surgery  Time spent 25 minutes-Greater than 50% of this time was spent in counseling, explanation of diagnosis, planning of further management, and coordination of care.  MEDICATIONS: Scheduled Meds: . amLODipine  2.5 mg Oral Daily  . budesonide (PULMICORT) nebulizer solution  0.25 mg Nebulization BID  . guaiFENesin  600 mg Oral BID  . ipratropium  0.5 mg Nebulization Q6H  . latanoprost  1 drop Both Eyes QHS  . levalbuterol  0.63 mg Nebulization Q6H  . levothyroxine  50 mcg Oral QAC breakfast  . methylPREDNISolone (SOLU-MEDROL) injection  60 mg Intravenous Once  . pantoprazole  40 mg Oral Daily  . polyethylene glycol  17 g Oral Daily  . warfarin  2.5 mg Oral QPM  . Warfarin - Physician Dosing Inpatient   Does not apply q1800   Continuous Infusions:   PRN Meds:.acetaminophen **OR** acetaminophen, albuterol, bisacodyl, HYDROcodone-acetaminophen, menthol-cetylpyridinium **OR** phenol, methocarbamol **OR** methocarbamol (ROBAXIN)  IV, metoCLOPramide **OR** metoCLOPramide (REGLAN) injection, morphine injection, morphine injection, ondansetron **OR** ondansetron (ZOFRAN) IV, sodium phosphate    PHYSICAL EXAM: Vital signs in last 24 hours: Filed Vitals:   05/09/15 1927 05/09/15 2251 05/10/15 0522 05/10/15 1103  BP:  135/100 120/51 122/63  Pulse:  90 45   Temp:  97.6 F (36.4 C) 97.7 F (36.5 C)   TempSrc:  Oral Oral   Resp:   16   SpO2: 98% 97% 98%     Weight change:  There were no vitals filed for this visit. There is no weight  on file to calculate BMI.   Gen Exam: Awake and alert with clear speech.   Neck: Supple, No JVD.   Chest: +rhonchi-good air entry bilaterally CVS: S1 S2 irregular, no murmurs.    Abdomen: soft, BS +, non tender, non distended.  Extremities: no edema, lower extremities warm to touch Neurologic: Non Focal.   Skin: No Rash.   Wounds: N/A.    Intake/Output from previous day:  Intake/Output Summary (Last 24 hours) at 05/10/15 1120 Last data filed at 05/10/15 0900  Gross per 24 hour  Intake    720 ml  Output   1400 ml  Net   -680 ml     LAB RESULTS: CBC  Recent Labs Lab 05/06/15 1448 05/07/15 0552 05/08/15 0516 05/09/15 0348 05/10/15 0431  WBC 16.1* 15.4* 17.5* 20.4* 15.6*  HGB 15.0 15.6 12.8* 12.0* 11.8*  HCT 41.8 43.9 36.4* 32.9* 33.4*  PLT 216 229 196 182 208  MCV 96.8 96.5 97.1 96.5 96.5  MCH 34.7* 34.3* 34.1* 35.2* 34.1*  MCHC 35.9 35.5 35.2 36.5* 35.3  RDW 12.5 12.6 12.6 12.7 12.8  LYMPHSABS 3.1  --   --   --  3.1  MONOABS 1.1*  --   --   --  2.0*  EOSABS 0.3  --   --   --  1.0*  BASOSABS 0.0  --   --   --  0.0    Chemistries   Recent Labs Lab 05/06/15 1407 05/07/15 0552 05/08/15 0516 05/09/15 0348 05/09/15 0950 05/10/15 0431  NA 140 137 137 139  --  138  K 4.7 4.4 4.5 3.9  --  4.1  CL 108 104 110 108  --  105  CO2 25 22 21* 22  --  23  GLUCOSE 119* 116* 157* 101*  --  106*  BUN 34* 28* 39* 40*  --  34*  CREATININE 1.87* 1.52* 2.09* 1.89*  --  1.68*  CALCIUM 8.7* 8.8* 7.9* 7.9*  --  8.0*  MG  --   --   --   --  1.5* 2.0    CBG: No results for input(s): GLUCAP in the last 168 hours.  GFR CrCl cannot be calculated (Unknown ideal weight.).  Coagulation profile  Recent Labs Lab 05/06/15 1630 05/07/15 0552 05/08/15 0516 05/09/15 0348 05/10/15 0431  INR 2.71* 1.46 1.15 1.09 1.14    Cardiac Enzymes No results for input(s): CKMB, TROPONINI, MYOGLOBIN in the last 168 hours.  Invalid input(s): CK  Invalid input(s): POCBNP No results for input(s): DDIMER in the last 72 hours. No results for input(s): HGBA1C in the last 72 hours. No results for input(s): CHOL, HDL, LDLCALC, TRIG, CHOLHDL, LDLDIRECT in the last 72  hours. No results for input(s): TSH, T4TOTAL, T3FREE, THYROIDAB in the last 72 hours.  Invalid input(s): FREET3 No results for input(s): VITAMINB12, FOLATE, FERRITIN, TIBC, IRON, RETICCTPCT in the last 72 hours. No results for input(s): LIPASE, AMYLASE in the last 72 hours.  Urine Studies No results for input(s): UHGB, CRYS in the last 72 hours.  Invalid input(s): UACOL, UAPR, USPG, UPH, UTP, UGL, UKET, UBIL, UNIT, UROB, ULEU, UEPI, UWBC, URBC, UBAC, CAST, UCOM, BILUA  MICROBIOLOGY: Recent Results (from the past 240 hour(s))  Surgical pcr screen     Status: None   Collection Time: 05/07/15  1:21 AM  Result Value Ref Range Status   MRSA, PCR NEGATIVE NEGATIVE Final   Staphylococcus aureus NEGATIVE NEGATIVE Final    Comment:  The Xpert SA Assay (FDA approved for NASAL specimens in patients over 58 years of age), is one component of a comprehensive surveillance program.  Test performance has been validated by [facility] for patients greater than or equal to 72 year old. It is not intended to diagnose infection nor to guide or monitor treatment.   Culture, blood (routine x 2)     Status: None (Preliminary result)   Collection Time: 05/08/15  3:10 PM  Result Value Ref Range Status   Specimen Description BLOOD BLOOD RIGHT HAND  Final   Special Requests BOTTLES DRAWN AEROBIC AND ANAEROBIC 5CC  Final   Culture NO GROWTH < 24 HOURS  Final   Report Status PENDING  Incomplete  Culture, blood (routine x 2)     Status: None (Preliminary result)   Collection Time: 05/08/15  3:18 PM  Result Value Ref Range Status   Specimen Description BLOOD BLOOD LEFT HAND  Final   Special Requests IN PEDIATRIC BOTTLE 2.5CC  Final   Culture NO GROWTH < 24 HOURS  Final   Report Status PENDING  Incomplete    RADIOLOGY STUDIES/RESULTS: Dg Chest 1 View  05/06/2015  CLINICAL DATA:  Larey Seat today while leaving church.  Syncopal episode. EXAM: CHEST 1 VIEW COMPARISON:  07/10/2013. FINDINGS: The heart  is upper limits of normal in size given the AP projection and supine position of the patient. There is tortuosity and calcification of the thoracic aorta. The lungs demonstrate emphysematous changes and pulmonary scarring with hyperinflation. Stable scarring changes at the right lung base. No acute overlying pulmonary process. The bony thorax is intact. IMPRESSION: Chronic emphysematous changes and pulmonary scarring. No acute pulmonary findings. Electronically Signed   By: Rudie Meyer M.D.   On: 05/06/2015 14:06   Dg Pelvis 1-2 Views  05/06/2015  CLINICAL DATA:  Fall, left hip pain EXAM: PELVIS - 1-2 VIEW COMPARISON:  04/25/2013 FINDINGS: Single frontal view of the pelvis submitted. Again noted right hip prosthesis with anatomic alignment. There is displaced mild impacted fracture of the left femoral neck. Diffuse osteopenia. IMPRESSION: Displaced mild impacted fracture of the left femoral neck. Electronically Signed   By: Natasha Mead M.D.   On: 05/06/2015 13:59   Pelvis Portable  05/07/2015  CLINICAL DATA:  Closed left hip fracture EXAM: PORTABLE PELVIS 1-2 VIEWS COMPARISON:  05/06/2015 FINDINGS: Single frontal view of the pelvis submitted. There is new left hip prosthesis with anatomic alignment. Stable right hip prosthesis. Small amount of postsurgical periarticular soft tissue air left hip region. IMPRESSION: New left hip prosthesis with anatomic alignment. Stable right hip prosthesis. Postsurgical changes left hip region. Electronically Signed   By: Natasha Mead M.D.   On: 05/07/2015 16:53   Dg Chest Port 1 View  05/10/2015  CLINICAL DATA:  Acute onset of shortness of breath. Initial encounter. EXAM: PORTABLE CHEST 1 VIEW COMPARISON:  Chest radiograph performed 05/09/2015 FINDINGS: The lungs remain hyperexpanded, with flattening of the hemidiaphragms, compatible with COPD. Mild right basilar opacity reflects chronic fibrosis, as previously noted. Pulmonary vascularity is at the upper limits of normal.  No pleural effusion or pneumothorax is seen. The cardiomediastinal silhouette is borderline normal in size. No acute osseous abnormalities are identified. IMPRESSION: Findings of COPD, with chronic fibrosis. No definite superimposed airspace consolidation seen. Electronically Signed   By: Roanna Raider M.D.   On: 05/10/2015 04:16   Dg Chest Port 1v Same Day  05/09/2015  CLINICAL DATA:  Shortness of breath. EXAM: PORTABLE CHEST 1 VIEW COMPARISON:  05/06/2015 FINDINGS: There is hyperinflation of the lungs compatible with COPD. Heart is borderline in size. Chronic density at the right lung base compatible with scarring. No acute airspace opacities or effusions. No acute bony abnormality. IMPRESSION: COPD.  Chronic changes.  No active disease. Electronically Signed   By: Charlett Nose M.D.   On: 05/09/2015 14:52   Dg Femur Min 2 Views Left  05/06/2015  CLINICAL DATA:  Left leg gave out.  Pain in left hip. EXAM: LEFT FEMUR 2 VIEWS COMPARISON:  Pelvis 05/06/2015 FINDINGS: Displaced fracture of the proximal left femur at the junction of the left femoral head and neck. This is compatible with a subcapital fracture. Proximal displacement of the distal fragment. Left femoral head is located. Visualized pelvic bony structures are intact. The distal left femur is intact. Patient has a right hip arthroplasty which is partially visualized. IMPRESSION: Subcapital fracture of the proximal left femur. Electronically Signed   By: Richarda Overlie M.D.   On: 05/06/2015 13:58    [name], MD  [facility] Pager:[phone]  If 7PM-7AM, please contact night-coverage [email or web address] Password [email or web address] 05/10/2015, 11:20 AM   LOS: 4 days

## 2015-05-10 NOTE — Progress Notes (Signed)
PATIENT ID: Jordan [name]  MRN: [id number]  DOB/AGE:  1918/06/20 / 82 y.o.  3 Days Post-Op Procedure(s) (LRB): LEFT HEMI-HIP ARTHROPLASTY CEMENTED (Left)    PROGRESS NOTE Subjective: Patient is alert, oriented, no Nausea, no Vomiting, yes passing gas, . Taking PO well. Denies SOB but does have some wheezing, Chest or Calf Pain. Using Incentive Spirometer, PAS in place. Ambulate WBAT with Pt walking 25 ft. Patient reports pain as  mild .    Objective: Vital signs in last 24 hours: Filed Vitals:   05/09/15 1536 05/09/15 1927 05/09/15 2251 05/10/15 0522  BP:   135/100 120/51  Pulse:   90 45  Temp:   97.6 F (36.4 C) 97.7 F (36.5 C)  TempSrc:   Oral Oral  Resp:    16  SpO2: 94% 98% 97% 98%      Intake/Output from previous day: I/O last 3 completed shifts: In: 600 [P.O.:600] Out: 1600 [Urine:1600]   Intake/Output this shift: Total I/O In: 240 [P.O.:240] Out: 300 [Urine:300]   LABORATORY DATA:  Recent Labs  05/09/15 0348 05/10/15 0431  WBC 20.4* 15.6*  HGB 12.0* 11.8*  HCT 32.9* 33.4*  PLT 182 208  NA 139 138  K 3.9 4.1  CL 108 105  CO2 22 23  BUN 40* 34*  CREATININE 1.89* 1.68*  GLUCOSE 101* 106*  INR 1.09 1.14  CALCIUM 7.9* 8.0*    Examination: Neurologically intact Neurovascular intact Sensation intact distally Intact pulses distally Dorsiflexion/Plantar flexion intact Incision: dressing C/D/I No cellulitis present Compartment soft} XR AP&Lat of hip shows well placed\fixed THA  Assessment:   3 Days Post-Op Procedure(s) (LRB): LEFT HEMI-HIP ARTHROPLASTY CEMENTED (Left) ADDITIONAL DIAGNOSIS:  Expected Acute Blood Loss Anemia, Hypertension, Cardiac Arrythmia afib, Renal Insufficiency Chronic and COPD, Chronic diastolic heart failure.  Plan: PT/OT WBAT, THA  DVT Prophylaxis: SCDx72 hrs, ASA 325 mg BID x 2 weeks  DISCHARGE PLAN: Skilled Nursing Facility/Rehab, [facility] when cleared by medicine  DISCHARGE NEEDS: HHPT, Walker and 3-in-1 comode  seat

## 2015-05-11 ENCOUNTER — Encounter (HOSPITAL_COMMUNITY): Payer: Self-pay | Admitting: Orthopedic Surgery

## 2015-05-11 MED ORDER — ALBUTEROL SULFATE (2.5 MG/3ML) 0.083% IN NEBU: 2.5000 mg | INHALATION_SOLUTION | RESPIRATORY_TRACT | Status: DC | PRN

## 2015-05-11 MED ORDER — WARFARIN SODIUM 2.5 MG PO TABS: ORAL_TABLET | ORAL | Status: DC

## 2015-05-11 MED ORDER — LEVALBUTEROL HCL 0.63 MG/3ML IN NEBU: 0.6300 mg | INHALATION_SOLUTION | Freq: Two times a day (BID) | RESPIRATORY_TRACT | Status: DC

## 2015-05-11 MED ORDER — POLYETHYLENE GLYCOL 3350 17 G PO PACK: 17.0000 g | PACK | Freq: Every day | ORAL | Status: DC

## 2015-05-11 MED ORDER — IPRATROPIUM BROMIDE 0.02 % IN SOLN: 0.5000 mg | Freq: Two times a day (BID) | RESPIRATORY_TRACT | Status: DC

## 2015-05-11 MED ORDER — PANTOPRAZOLE SODIUM 40 MG PO TBEC: 40.0000 mg | DELAYED_RELEASE_TABLET | Freq: Every day | ORAL | Status: DC

## 2015-05-11 MED ORDER — GUAIFENESIN ER 600 MG PO TB12: 600.0000 mg | ORAL_TABLET | Freq: Two times a day (BID) | ORAL | Status: DC

## 2015-05-11 MED ORDER — PREDNISONE 10 MG PO TABS: ORAL_TABLET | ORAL | Status: DC

## 2015-05-11 NOTE — Discharge Summary (Addendum)
PATIENT DETAILS Name: Jordan [name] Age: 82 y.o. Sex: male Date of Birth: [birth year] MRN: [id number]. Admitting Physician: Starleen Arms, MD WUJ:[id number],NWGNFA Joelene Millin, MD  Admit Date: 05/06/2015 Discharge date: 05/11/2015  Recommendations for Outpatient Follow-up:  1. Please ensure follow-up with orthopedics-Dr. Turner Daniels in 2 weeks  2. Please check INR in the next 2-3 days 3. Please repeat CBC/BMET in 1 week 4. Please follow blood cultures till final  PRIMARY DISCHARGE DIAGNOSIS:  Active Problems:   Hypertension   Hypothyroidism   Atrial fibrillation (HCC)   COPD (chronic obstructive pulmonary disease) (HCC)   Chronic anticoagulation   Chronic diastolic heart failure (HCC)   Chronic kidney disease (CKD) stage G3a/A1   Hip fracture (HCC)   Femur fracture, left (HCC)      PAST MEDICAL HISTORY: Past Medical History  Diagnosis Date  . GERD (gastroesophageal reflux disease)   . Hypertension   . COPD (chronic obstructive pulmonary disease) (HCC)   . Chronic kidney disease   . Ventricular bigeminy   . PVC (premature ventricular contraction)   . Dyspnea   . Thyroid disease   . CHF (congestive heart failure) (HCC)   . Family history of adverse reaction to anesthesia     " daughter has difficulty waking "    DISCHARGE MEDICATIONS: Current Discharge Medication List    START taking these medications   Details  albuterol (PROVENTIL) (2.5 MG/3ML) 0.083% nebulizer solution Take 3 mLs (2.5 mg total) by nebulization every 2 (two) hours as needed for wheezing or shortness of breath.    guaiFENesin (MUCINEX) 600 MG 12 hr tablet Take 1 tablet (600 mg total) by mouth 2 (two) times daily.    HYDROcodone-acetaminophen (NORCO) 5-325 MG tablet Take 1 tablet by mouth every 6 (six) hours as needed. Qty: 60 tablet, Refills: 0    ipratropium (ATROVENT) 0.02 % nebulizer solution Take 2.5 mLs (0.5 mg total) by nebulization 2 (two) times daily.    levalbuterol (XOPENEX) 0.63 MG/3ML  nebulizer solution Take 3 mLs (0.63 mg total) by nebulization 2 (two) times daily.    pantoprazole (PROTONIX) 40 MG tablet Take 1 tablet (40 mg total) by mouth daily.    polyethylene glycol (MIRALAX / GLYCOLAX) packet Take 17 g by mouth daily.    tizanidine (ZANAFLEX) 2 MG capsule Take 1 capsule (2 mg total) by mouth 3 (three) times daily. Qty: 60 capsule, Refills: 0      CONTINUE these medications which have CHANGED   Details  warfarin (COUMADIN) 2.5 MG tablet Take 5 mg daily for next 2 days and then resume 2.5 mg daily Please check INR in 2-3 days. Qty: 40 tablet, Refills: 3      CONTINUE these medications which have NOT CHANGED   Details  acetaminophen (TYLENOL) 500 MG tablet Take 1,000 mg by mouth 2 (two) times daily.     albuterol (PROVENTIL HFA;VENTOLIN HFA) 108 (90 BASE) MCG/ACT inhaler Inhale 2 puffs into the lungs every 6 (six) hours as needed for wheezing.    amLODipine (NORVASC) 2.5 MG tablet Take 2.5 mg by mouth daily.    hydrochlorothiazide (HYDRODIURIL) 25 MG tablet Take 12.5 mg by mouth daily.     latanoprost (XALATAN) 0.005 % ophthalmic solution Place 1 drop into both eyes at bedtime. For glaucoma    levothyroxine (SYNTHROID, LEVOTHROID) 50 MCG tablet Take 50 mcg by mouth daily before breakfast.      STOP taking these medications     omeprazole (PRILOSEC) 20 MG capsule  traMADol (ULTRAM) 50 MG tablet         ALLERGIES:   Allergies  Allergen Reactions  . Diltiazem Hcl Er Beads Other (See Comments)    Tachycardia  . Metoprolol Succinate Er [Metoprolol Tartrate] Other (See Comments)    hallucinations    BRIEF HPI:  See H&P, Labs, Consult and Test reports for all details in brief, patient Is a 82 year old male who sustained a mechanical fall and was admitted for further evaluation and treatment. He was found to have a left hip fracture.   CONSULTATIONS:   orthopedic surgery  PERTINENT RADIOLOGIC STUDIES: Dg Chest 1 View  05/06/2015  CLINICAL  DATA:  Larey Seat today while leaving church.  Syncopal episode. EXAM: CHEST 1 VIEW COMPARISON:  07/10/2013. FINDINGS: The heart is upper limits of normal in size given the AP projection and supine position of the patient. There is tortuosity and calcification of the thoracic aorta. The lungs demonstrate emphysematous changes and pulmonary scarring with hyperinflation. Stable scarring changes at the right lung base. No acute overlying pulmonary process. The bony thorax is intact. IMPRESSION: Chronic emphysematous changes and pulmonary scarring. No acute pulmonary findings. Electronically Signed   By: Rudie Meyer M.D.   On: 05/06/2015 14:06   Dg Pelvis 1-2 Views  05/06/2015  CLINICAL DATA:  Fall, left hip pain EXAM: PELVIS - 1-2 VIEW COMPARISON:  04/25/2013 FINDINGS: Single frontal view of the pelvis submitted. Again noted right hip prosthesis with anatomic alignment. There is displaced mild impacted fracture of the left femoral neck. Diffuse osteopenia. IMPRESSION: Displaced mild impacted fracture of the left femoral neck. Electronically Signed   By: Natasha Mead M.D.   On: 05/06/2015 13:59   Pelvis Portable  05/07/2015  CLINICAL DATA:  Closed left hip fracture EXAM: PORTABLE PELVIS 1-2 VIEWS COMPARISON:  05/06/2015 FINDINGS: Single frontal view of the pelvis submitted. There is new left hip prosthesis with anatomic alignment. Stable right hip prosthesis. Small amount of postsurgical periarticular soft tissue air left hip region. IMPRESSION: New left hip prosthesis with anatomic alignment. Stable right hip prosthesis. Postsurgical changes left hip region. Electronically Signed   By: Natasha Mead M.D.   On: 05/07/2015 16:53   Dg Chest Port 1 View  05/10/2015  CLINICAL DATA:  Acute onset of shortness of breath. Initial encounter. EXAM: PORTABLE CHEST 1 VIEW COMPARISON:  Chest radiograph performed 05/09/2015 FINDINGS: The lungs remain hyperexpanded, with flattening of the hemidiaphragms, compatible with COPD. Mild  right basilar opacity reflects chronic fibrosis, as previously noted. Pulmonary vascularity is at the upper limits of normal. No pleural effusion or pneumothorax is seen. The cardiomediastinal silhouette is borderline normal in size. No acute osseous abnormalities are identified. IMPRESSION: Findings of COPD, with chronic fibrosis. No definite superimposed airspace consolidation seen. Electronically Signed   By: Roanna Raider M.D.   On: 05/10/2015 04:16   Dg Chest Port 1v Same Day  05/09/2015  CLINICAL DATA:  Shortness of breath. EXAM: PORTABLE CHEST 1 VIEW COMPARISON:  05/06/2015 FINDINGS: There is hyperinflation of the lungs compatible with COPD. Heart is borderline in size. Chronic density at the right lung base compatible with scarring. No acute airspace opacities or effusions. No acute bony abnormality. IMPRESSION: COPD.  Chronic changes.  No active disease. Electronically Signed   By: Charlett Nose M.D.   On: 05/09/2015 14:52   Dg Femur Min 2 Views Left  05/06/2015  CLINICAL DATA:  Left leg gave out.  Pain in left hip. EXAM: LEFT FEMUR 2 VIEWS COMPARISON:  Pelvis  05/06/2015 FINDINGS: Displaced fracture of the proximal left femur at the junction of the left femoral head and neck. This is compatible with a subcapital fracture. Proximal displacement of the distal fragment. Left femoral head is located. Visualized pelvic bony structures are intact. The distal left femur is intact. Patient has a right hip arthroplasty which is partially visualized. IMPRESSION: Subcapital fracture of the proximal left femur. Electronically Signed   By: Richarda Overlie M.D.   On: 05/06/2015 13:58     PERTINENT LAB RESULTS: CBC:  Recent Labs  05/09/15 0348 05/10/15 0431  WBC 20.4* 15.6*  HGB 12.0* 11.8*  HCT 32.9* 33.4*  PLT 182 208   CMET CMP     Component Value Date/Time   NA 138 05/10/2015 0431   K 4.1 05/10/2015 0431   CL 105 05/10/2015 0431   CO2 23 05/10/2015 0431   GLUCOSE 106* 05/10/2015 0431   BUN  34* 05/10/2015 0431   CREATININE 1.68* 05/10/2015 0431   CALCIUM 8.0* 05/10/2015 0431   PROT 6.3* 05/06/2015 1407   ALBUMIN 3.6 05/06/2015 1407   AST 25 05/06/2015 1407   ALT 15* 05/06/2015 1407   ALKPHOS 87 05/06/2015 1407   BILITOT 0.8 05/06/2015 1407   GFRNONAA 33* 05/10/2015 0431   GFRAA 38* 05/10/2015 0431    GFR CrCl cannot be calculated (Unknown ideal weight.). No results for input(s): LIPASE, AMYLASE in the last 72 hours. No results for input(s): CKTOTAL, CKMB, CKMBINDEX, TROPONINI in the last 72 hours. Invalid input(s): POCBNP No results for input(s): DDIMER in the last 72 hours. No results for input(s): HGBA1C in the last 72 hours. No results for input(s): CHOL, HDL, LDLCALC, TRIG, CHOLHDL, LDLDIRECT in the last 72 hours. No results for input(s): TSH, T4TOTAL, T3FREE, THYROIDAB in the last 72 hours.  Invalid input(s): FREET3 No results for input(s): VITAMINB12, FOLATE, FERRITIN, TIBC, IRON, RETICCTPCT in the last 72 hours. Coags:  Recent Labs  05/09/15 0348 05/10/15 0431  INR 1.09 1.14   Microbiology: Recent Results (from the past 240 hour(s))  Surgical pcr screen     Status: None   Collection Time: 05/07/15  1:21 AM  Result Value Ref Range Status   MRSA, PCR NEGATIVE NEGATIVE Final   Staphylococcus aureus NEGATIVE NEGATIVE Final    Comment:        The Xpert SA Assay (FDA approved for NASAL specimens in patients over 4 years of age), is one component of a comprehensive surveillance program.  Test performance has been validated by [facility] for patients greater than or equal to 80 year old. It is not intended to diagnose infection nor to guide or monitor treatment.   Culture, blood (routine x 2)     Status: None (Preliminary result)   Collection Time: 05/08/15  3:10 PM  Result Value Ref Range Status   Specimen Description BLOOD BLOOD RIGHT HAND  Final   Special Requests BOTTLES DRAWN AEROBIC AND ANAEROBIC 5CC  Final   Culture NO GROWTH 2 DAYS   Final   Report Status PENDING  Incomplete  Culture, blood (routine x 2)     Status: None (Preliminary result)   Collection Time: 05/08/15  3:18 PM  Result Value Ref Range Status   Specimen Description BLOOD BLOOD LEFT HAND  Final   Special Requests IN PEDIATRIC BOTTLE 2.5CC  Final   Culture NO GROWTH 2 DAYS  Final   Report Status PENDING  Incomplete     BRIEF HOSPITAL COURSE:  Closed left hip fracture: Secondary to  a mechanical fall. Seen by orthopedics, underwent left hip hemiarthroplasty on 2/24. Orthopedics following, recommendations are weightbearing as tolerated, continue warfarin for VTE prophylaxis.  Atrial fibrillation: Not on any rate control medications-has not tolerated beta blocker or any rate controlling agents in the past due to severe bradycardia. On admission Coumadin reversed in preparation for left hip surgery. Coumadin now has been resumed-INR remains sub-therapeutic-we will give 5 mg of Coumadin for the next 2 days, and then resume usual dosing (2.5 mg daily) of Coumadin. Follow INR closely while at SNF  Mild COPD exacerbation: Occurred in the postoperative setting, significantly improved by the day of discharge-no longer wheezing-lungs are clear. Will plan on a short course of tapering prednisone, continue incentive spirometry, continue bronchodilators on discharge.   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 Not able to tolerate beta blockers in the past due to bradycardia.   Leukocytosis: Likely stress/demargination.Leuckotyosis now down trending. Afebrile, UA/chest x-ray negative for infection,blood cultures negative as well.Looks was monitored off antibiotics.   Hypertension: Controlled, continue amlodipine,   Hypothyroidism: Continue levothyroxine  Acute on Chronic kidney disease stage III: Slight bump in creatinine post surgery-managed with IV fluids-creatinine back to baseline. Resume HCTZ. Tentative follow electrolytes  closely in the outpatient setting  Chronic diastolic CHF: Compensated, no signs of volume overload.  GERD: Continue PPI.  TODAY-DAY OF DISCHARGE:  Subjective:   Jordan [name] today has no headache,no chest abdominal pain,no new weakness tingling or numbness, feels much better wants to go home today.   Objective:   Blood pressure 136/56, pulse 79, temperature 98.2 F (36.8 C), temperature source Oral, resp. rate 16, SpO2 96 %.  Intake/Output Summary (Last 24 hours) at 05/11/15 1056 Last data filed at 05/11/15 0700  Gross per 24 hour  Intake    480 ml  Output      0 ml  Net    480 ml   There were no vitals filed for this visit.  Exam Awake Alert, Oriented *3, No new F.N deficits, Normal affect [place].AT,PERRAL Supple Neck,No JVD, No cervical lymphadenopathy appriciated.  Symmetrical Chest wall movement, Good air movement bilaterally, CTAB RRR,No Gallops,Rubs or new Murmurs, No Parasternal Heave +ve B.Sounds, Abd Soft, Non tender, No organomegaly appriciated, No rebound -guarding or rigidity. No Cyanosis, Clubbing or edema, No new Rash or bruise  DISCHARGE CONDITION: Stable  DISPOSITION: SNF  DISCHARGE INSTRUCTIONS:    Activity:  Weight bearing as tolerated  Get Medicines reviewed and adjusted: Please take all your medications with you for your next visit with your Primary MD  Please request your Primary MD to go over all hospital tests and procedure/radiological results at the follow up, please ask your Primary MD to get all Hospital records sent to his/her office.  If you experience worsening of your admission symptoms, develop shortness of breath, life threatening emergency, suicidal or homicidal thoughts you must seek medical attention immediately by calling 911 or calling your MD immediately  if symptoms less severe.  You must read complete instructions/literature along with all the possible adverse reactions/side effects for all the Medicines you take and that have  been prescribed to you. Take any new Medicines after you have completely understood and accpet all the possible adverse reactions/side effects.   Do not drive when taking Pain medications.   Do not take more than prescribed Pain, Sleep and Anxiety Medications  Special Instructions: If you have smoked or chewed Tobacco  in the last 2 yrs please stop smoking, stop any  regular Alcohol  and or any Recreational drug use.  Wear Seat belts while driving.  Please note  You were cared for by a hospitalist during your hospital stay. Once you are discharged, your primary care physician will handle any further medical issues. Please note that NO REFILLS for any discharge medications will be authorized once you are discharged, as it is imperative that you return to your primary care physician (or establish a relationship with a primary care physician if you do not have one) for your aftercare needs so that they can reassess your need for medications and monitor your lab values.   Diet recommendation: Heart Healthy diet  Discharge Instructions    Diet - low sodium heart healthy    Complete by:  As directed      Increase activity slowly    Complete by:  As directed      Weight bearing as tolerated    Complete by:  As directed            Follow-up Information    Follow up with Nestor Lewandowsky, MD In 2 weeks.   Specialty:  Orthopedic Surgery   Why:  from surgery date   Contact information:   [address] [place] [address] [phone]       Follow up with [facility].   Specialty:  Skilled Nursing Facility   Contact information:   [address] [place] [address] [phone]      Follow up with Kaleen Mask, MD. Schedule an appointment as soon as possible for a visit in 1 week.   Specialty:  Family Medicine   Why:  Hospital follow up   Contact information:   [address] [place] [address] [phone]      Total Time spent on  discharge equals  45 minutes.  SignedJeoffrey Massed 05/11/2015 10:56 AM

## 2015-05-11 NOTE — Progress Notes (Signed)
Changed dressing to the skin tear on L arm.

## 2015-05-11 NOTE — Clinical Social Work Note (Signed)
BSW intern to arrange transport for pt via [facility]. BSW intern has contacted pt's daughter, Massie Bougie to make her aware of pt's d/c. BSW intern unable to reach pt's daughter at the time however has left a detailed message on voicemail. BSW intern has also made Jasmine December of [facility] aware of pt's arrival.   BSW intern is signing off. If any further Social Work needs arises, please re-consult.   Orson Gear- BSW intern [phone]

## 2015-05-11 NOTE — Progress Notes (Signed)
Called report to [facility].Gave report to RN  

## 2015-05-11 NOTE — Progress Notes (Signed)
Patient ID: Jordan [name], male   DOB: 1919/01/16, 82 y.o.   MRN: [id number] PATIENT ID: Jordan [name]  MRN: [id number]  DOB/AGE:  [birth year] / 82 y.o.  4 Days Post-Op Procedure(s) (LRB): LEFT HEMI-HIP ARTHROPLASTY CEMENTED (Left)    PROGRESS NOTE Subjective: Patient is alert, oriented, no Nausea, no Vomiting, yes passing gas, . Taking PO well. Denies SOB, Chest or Calf Pain. Using Incentive Spirometer, PAS in place. Ambulate WBAT with PT Patient reports pain as  2/10  .    Objective: Vital signs in last 24 hours: Filed Vitals:   05/10/15 1300 05/10/15 2122 05/10/15 2135 05/11/15 0451  BP: 105/48 122/54  136/46  Pulse: 77 75  79  Temp: 98.4 F (36.9 C) 98.3 F (36.8 C)  98.2 F (36.8 C)  TempSrc:  Oral  Oral  Resp: SpO2: 100% 96% 97% 94%      Intake/Output from previous day: I/O last 3 completed shifts: In: 480 [P.O.:480] Out: 1050 [Urine:1050]   Intake/Output this shift:     LABORATORY DATA:  Recent Labs  05/09/15 0348 05/10/15 0431  WBC 20.4* 15.6*  HGB 12.0* 11.8*  HCT 32.9* 33.4*  PLT 182 208  NA 139 138  K 3.9 4.1  CL 108 105  CO2 22 23  BUN 40* 34*  CREATININE 1.89* 1.68*  GLUCOSE 101* 106*  INR 1.09 1.14  CALCIUM 7.9* 8.0*    Examination: Neurologically intact ABD soft Neurovascular intact Sensation intact distally Intact pulses distally Dorsiflexion/Plantar flexion intact Incision: dressing C/D/I No cellulitis present Compartment soft} XR AP&Lat of hip shows well placed\fixed THA  Assessment:   4 Days Post-Op Procedure(s) (LRB): LEFT HEMI-HIP ARTHROPLASTY CEMENTED (Left) ADDITIONAL DIAGNOSIS:  Expected Acute Blood Loss Anemia, Hypertension and Cardiac Arrythmia a fib  Plan: PT/OT WBAT, THA  DVT Prophylaxis: SCDx72 hrs, ASA 325 mg BID x 2 weeks  DISCHARGE PLAN: Skilled Nursing Facility/[facility]  DISCHARGE NEEDS: HHPT, Walker and 3-in-1 comode seat

## 2015-05-11 NOTE — Clinical Social Work Placement (Signed)
   CLINICAL SOCIAL WORK PLACEMENT  NOTE  Date:  05/11/2015  Patient Details  Name: Jordan [name] MRN: [id number] Date of Birth: 1918-10-10  Clinical Social Work is seeking post-discharge placement for this patient at the Skilled  Nursing Facility level of care (*CSW will initial, date and re-position this form in  chart as items are completed):  Yes   Patient/family provided with [place] Clinical Social Work Department's list of facilities offering this level of care within the geographic area requested by the patient (or if unable, by the patient's family).  Yes   Patient/family informed of their freedom to choose among providers that offer the needed level of care, that participate in Medicare, Medicaid or managed care program needed by the patient, have an available bed and are willing to accept the patient.  Yes   Patient/family informed of [place] [facility]'s ownership interest in [facility] and [facility], as well as of the fact that they are under no obligation to receive care at these facilities.  PASRR submitted to EDS on       PASRR number received on       Existing PASRR number confirmed on 05/08/15     FL2 transmitted to all facilities in geographic area requested by pt/family on 05/08/15     FL2 transmitted to all facilities within larger geographic area on       Patient informed that his/her managed care company has contracts with or will negotiate with certain facilities, including the following:        Yes   Patient/family informed of bed offers received.  Patient chooses bed at  [facility])     Physician recommends and patient chooses bed at      Patient to be transferred to  [facility]) on 05/11/15.  Patient to be transferred to facility by  [facility])     Patient family notified on 05/11/15 of transfer.  Name of family member notified:   Peri Jefferson)     PHYSICIAN       Additional Comment:     _______________________________________________ Orson Gear, Student-SW 05/11/2015, 12:18 PM

## 2015-05-12 ENCOUNTER — Encounter: Payer: Self-pay | Admitting: Internal Medicine

## 2015-05-12 ENCOUNTER — Non-Acute Institutional Stay (SKILLED_NURSING_FACILITY): Payer: Medicare Other | Admitting: Internal Medicine

## 2015-05-12 DIAGNOSIS — H409 Unspecified glaucoma: Secondary | ICD-10-CM

## 2015-05-12 DIAGNOSIS — J441 Chronic obstructive pulmonary disease with (acute) exacerbation: Secondary | ICD-10-CM

## 2015-05-12 DIAGNOSIS — D72829 Elevated white blood cell count, unspecified: Secondary | ICD-10-CM | POA: Diagnosis not present

## 2015-05-12 DIAGNOSIS — I10 Essential (primary) hypertension: Secondary | ICD-10-CM | POA: Diagnosis not present

## 2015-05-12 DIAGNOSIS — N183 Chronic kidney disease, stage 3 unspecified: Secondary | ICD-10-CM

## 2015-05-12 DIAGNOSIS — K219 Gastro-esophageal reflux disease without esophagitis: Secondary | ICD-10-CM

## 2015-05-12 DIAGNOSIS — D62 Acute posthemorrhagic anemia: Secondary | ICD-10-CM

## 2015-05-12 DIAGNOSIS — E038 Other specified hypothyroidism: Secondary | ICD-10-CM

## 2015-05-12 DIAGNOSIS — S72142S Displaced intertrochanteric fracture of left femur, sequela: Secondary | ICD-10-CM

## 2015-05-12 DIAGNOSIS — H02106 Unspecified ectropion of left eye, unspecified eyelid: Secondary | ICD-10-CM

## 2015-05-12 DIAGNOSIS — I5032 Chronic diastolic (congestive) heart failure: Secondary | ICD-10-CM

## 2015-05-12 DIAGNOSIS — R5381 Other malaise: Secondary | ICD-10-CM

## 2015-05-12 DIAGNOSIS — I480 Paroxysmal atrial fibrillation: Secondary | ICD-10-CM

## 2015-05-12 DIAGNOSIS — K5900 Constipation, unspecified: Secondary | ICD-10-CM

## 2015-05-12 NOTE — Progress Notes (Signed)
Patient ID: Jordan [name], male   DOB: May 10, 1918, 82 y.o.   MRN: [id number]    LOCATION: [facility]  PCP: Kaleen Mask, MD   Code Status: Full Code  Goals of care: Advanced Directive information Advanced Directives 05/11/2015  Does patient have an advance directive? No  Would patient like information on creating an advanced directive? No - patient declined information  Pre-existing out of facility DNR order (yellow form or pink MOST form) -       Extended Emergency Contact Information Primary Emergency Contact: East,Katrine Address: [address]          [place] [address] [place] Home Phone: [phone] Relation: Daughter Secondary Emergency Contact: Hammock,Timothy Address: [address]          [place], [place] [address] [place] Phone: [phone] Relation: Son   Allergies  Allergen Reactions  . Diltiazem Hcl Er Beads Other (See Comments)    Tachycardia  . Metoprolol Succinate Er [Metoprolol Tartrate] Other (See Comments)    hallucinations    Chief Complaint  Patient presents with  . New Admit To SNF    New Admission     HPI:  Patient is a 82 y.o. male seen today for short term rehabilitation post hospital admission from 05/06/15-05/11/15 with left hip fracture post fall. He underwent hemiarthroplasty on 05/07/15. He had copd exacerbation and was started on prednisone. He had acute on chronic renal failure and required iv fluids. He has PMH of afib, chf, ckd stage 3 among others. He is seen in his room today with his daughters present. He is somnolent and per daughters he did not sleep last night. He had worked with therapy team this am.   Review of Systems: unable to obtain from patient as he is falling asleep during conversation. Per daughter. Per daughter, he was living with one of the daughters prior to this admission and was independent with his ADLs Constitutional: Negative for  fever HENT: Negative for headache Eyes: Negative for blurred vision Respiratory: Negative for cough, shortness of breath and wheezing.   Cardiovascular: Negative for chest pain, palpitations  Gastrointestinal: Negative for heartburn, nausea, vomiting, abdominal pain. Genitourinary: Negative for dysuria Neurological: Negative for dizziness    Past Medical History  Diagnosis Date  . GERD (gastroesophageal reflux disease)   . Hypertension   . COPD (chronic obstructive pulmonary disease) (HCC)   . Chronic kidney disease   . Ventricular bigeminy   . PVC (premature ventricular contraction)   . Dyspnea   . Thyroid disease   . CHF (congestive heart failure) (HCC)   . Family history of adverse reaction to anesthesia     " daughter has difficulty waking "   Past Surgical History  Procedure Laterality Date  . Cholecystectomy    . Splenectomy, partial    . Esophagogastroduodenoscopy N/A 04/26/2013    Procedure: ESOPHAGOGASTRODUODENOSCOPY (EGD);  Surgeon: Florencia Reasons, MD;  Location: [facility] ENDOSCOPY;  Service: Endoscopy;  Laterality: N/A;  . Esophagogastroduodenoscopy (egd) with esophageal dilation N/A 04/26/2013    Procedure: ESOPHAGOGASTRODUODENOSCOPY (EGD) WITH ESOPHAGEAL DILATION;  Surgeon: Florencia Reasons, MD;  Location: [facility] ENDOSCOPY;  Service: Endoscopy;  Laterality: N/A;  . Hip arthroplasty Right 04/25/2013    Procedure: ARTHROPLASTY BIPOLAR HIP;  Surgeon: Nestor Lewandowsky, MD;  Location: [facility] OR;  Service: Orthopedics;  Laterality: Right;  . Hip arthroplasty Left 05/07/2015    Procedure: LEFT HEMI-HIP ARTHROPLASTY CEMENTED;  Surgeon: Gean Birchwood, MD;  Location: [facility] OR;  Service: Orthopedics;  Laterality: Left;   Social History:   reports that he has quit smoking. He has never used smokeless tobacco. He reports that he does not drink alcohol or use illicit drugs.  Family History  Problem Relation Age of Onset  . Hypertension Father     Medications:   Medication List       This  list is accurate as of: 05/12/15 11:50 AM.  Always use your most recent med list.               acetaminophen 500 MG tablet  Commonly known as:  TYLENOL  Take 1,000 mg by mouth 2 (two) times daily.     albuterol (2.5 MG/3ML) 0.083% nebulizer solution  Commonly known as:  PROVENTIL  Take 3 mLs (2.5 mg total) by nebulization every 2 (two) hours as needed for wheezing or shortness of breath.     albuterol 108 (90 Base) MCG/ACT inhaler  Commonly known as:  PROVENTIL HFA;VENTOLIN HFA  Inhale 2 puffs into the lungs every 6 (six) hours as needed for wheezing.     amLODipine 2.5 MG tablet  Commonly known as:  NORVASC  Take 2.5 mg by mouth daily.     guaiFENesin 600 MG 12 hr tablet  Commonly known as:  MUCINEX  Take 1 tablet (600 mg total) by mouth 2 (two) times daily.     hydrochlorothiazide 25 MG tablet  Commonly known as:  HYDRODIURIL  Take 12.5 mg by mouth daily.     HYDROcodone-acetaminophen 5-325 MG tablet  Commonly known as:  NORCO  Take 1 tablet by mouth every 6 (six) hours as needed.     ipratropium 0.02 % nebulizer solution  Commonly known as:  ATROVENT  Take 2.5 mLs (0.5 mg total) by nebulization 2 (two) times daily.     latanoprost 0.005 % ophthalmic solution  Commonly known as:  XALATAN  Place 1 drop into both eyes at bedtime. For glaucoma     levalbuterol 0.63 MG/3ML nebulizer solution  Commonly known as:  XOPENEX  Take 3 mLs (0.63 mg total) by nebulization 2 (two) times daily.     levothyroxine 50 MCG tablet  Commonly known as:  SYNTHROID, LEVOTHROID  Take 50 mcg by mouth daily before breakfast.     pantoprazole 40 MG tablet  Commonly known as:  PROTONIX  Take 1 tablet (40 mg total) by mouth daily.     polyethylene glycol packet  Commonly known as:  MIRALAX / GLYCOLAX  Take 17 g by mouth daily.     tizanidine 2 MG capsule  Commonly known as:  ZANAFLEX  Take 1 capsule (2 mg total) by mouth 3 (three) times daily.     warfarin 2.5 MG tablet  Commonly  known as:  COUMADIN  Take 2.5 mg by mouth daily.        Immunizations: Immunization History  Administered Date(s) Administered  . Influenza,inj,Quad PF,36+ Mos 12/18/2012  . PPD Test 04/28/2013, 05/11/2015     Physical Exam: Filed Vitals:   05/12/15 1113  BP: 102/88  Pulse: 70  Temp: 97.1 F (36.2 C)  TempSrc: Oral  Resp: 18  Height: 5\' 8"  (1.727 m)  Weight: 182 lb 3.2 oz (82.645 kg)  SpO2: 96%   Body mass index is 27.71 kg/(m^2).  General- elderly male, well built, in no acute distress Head- normocephalic, atraumatic Nose- no maxillary or frontal sinus tenderness, no nasal discharge Throat- moist mucus membrane  Eyes- PERRLA, EOMI, no  pallor, no icterus, no discharge, left eye ectropion + Neck- no cervical lymphadenopathy Cardiovascular- normal s1,s2, , trace leg edema Respiratory- bilateral clear to auscultation, no wheeze, no rhonchi, no crackles, no use of accessory muscles Abdomen- bowel sounds present, soft, non tender Musculoskeletal- able to move all 4 extremities, limited left leg range of motion Neurological- unable to assess Skin- warm and dry, left hip surgical incision with internal sutures and healing well, left elbow skin tear   Labs reviewed: Basic Metabolic Panel:  Recent Labs  16/10/96 0516 05/09/15 0348 05/09/15 0950 05/10/15 05/10/15 0431  NA 137 139  --  138 138  K 4.5 3.9  --   --  4.1  CL 110 108  --   --  105  CO2 21* 22  --   --  23  GLUCOSE 157* 101*  --   --  106*  BUN 39* 40*  --  34* 34*  CREATININE 2.09* 1.89*  --  1.7* 1.68*  CALCIUM 7.9* 7.9*  --   --  8.0*  MG  --   --  1.5*  --  2.0   Liver Function Tests:  Recent Labs  05/06/15 1407  AST 25  ALT 15*  ALKPHOS 87  BILITOT 0.8  PROT 6.3*  ALBUMIN 3.6   No results for input(s): LIPASE, AMYLASE in the last 8760 hours. No results for input(s): AMMONIA in the last 8760 hours. CBC:  Recent Labs  05/06/15 1448  05/08/15 0516 05/09/15 0348 05/10/15  05/10/15 0431  WBC 16.1*  < > 17.5* 20.4* 15.6 15.6*  NEUTROABS 11.6*  --   --   --   --  9.6*  HGB 15.0  < > 12.8* 12.0*  --  11.8*  HCT 41.8  < > 36.4* 32.9*  --  33.4*  MCV 96.8  < > 97.1 96.5  --  96.5  PLT 216  < > 196 182  --  208  < > = values in this interval not displayed.  Radiological Exams: Dg Chest 1 View  05/06/2015  CLINICAL DATA:  Larey Seat today while leaving church.  Syncopal episode. EXAM: CHEST 1 VIEW COMPARISON:  07/10/2013. FINDINGS: The heart is upper limits of normal in size given the AP projection and supine position of the patient. There is tortuosity and calcification of the thoracic aorta. The lungs demonstrate emphysematous changes and pulmonary scarring with hyperinflation. Stable scarring changes at the right lung base. No acute overlying pulmonary process. The bony thorax is intact. IMPRESSION: Chronic emphysematous changes and pulmonary scarring. No acute pulmonary findings. Electronically Signed   By: Rudie Meyer M.D.   On: 05/06/2015 14:06   Dg Pelvis 1-2 Views  05/06/2015  CLINICAL DATA:  Fall, left hip pain EXAM: PELVIS - 1-2 VIEW COMPARISON:  04/25/2013 FINDINGS: Single frontal view of the pelvis submitted. Again noted right hip prosthesis with anatomic alignment. There is displaced mild impacted fracture of the left femoral neck. Diffuse osteopenia. IMPRESSION: Displaced mild impacted fracture of the left femoral neck. Electronically Signed   By: Natasha Mead M.D.   On: 05/06/2015 13:59   Pelvis Portable  05/07/2015  CLINICAL DATA:  Closed left hip fracture EXAM: PORTABLE PELVIS 1-2 VIEWS COMPARISON:  05/06/2015 FINDINGS: Single frontal view of the pelvis submitted. There is new left hip prosthesis with anatomic alignment. Stable right hip prosthesis. Small amount of postsurgical periarticular soft tissue air left hip region. IMPRESSION: New left hip prosthesis with anatomic alignment. Stable right hip prosthesis. Postsurgical changes  left hip region.  Electronically Signed   By: Natasha Mead M.D.   On: 05/07/2015 16:53   Dg Chest Port 1 View  05/10/2015  CLINICAL DATA:  Acute onset of shortness of breath. Initial encounter. EXAM: PORTABLE CHEST 1 VIEW COMPARISON:  Chest radiograph performed 05/09/2015 FINDINGS: The lungs remain hyperexpanded, with flattening of the hemidiaphragms, compatible with COPD. Mild right basilar opacity reflects chronic fibrosis, as previously noted. Pulmonary vascularity is at the upper limits of normal. No pleural effusion or pneumothorax is seen. The cardiomediastinal silhouette is borderline normal in size. No acute osseous abnormalities are identified. IMPRESSION: Findings of COPD, with chronic fibrosis. No definite superimposed airspace consolidation seen. Electronically Signed   By: Roanna Raider M.D.   On: 05/10/2015 04:16   Dg Chest Port 1v Same Day  05/09/2015  CLINICAL DATA:  Shortness of breath. EXAM: PORTABLE CHEST 1 VIEW COMPARISON:  05/06/2015 FINDINGS: There is hyperinflation of the lungs compatible with COPD. Heart is borderline in size. Chronic density at the right lung base compatible with scarring. No acute airspace opacities or effusions. No acute bony abnormality. IMPRESSION: COPD.  Chronic changes.  No active disease. Electronically Signed   By: Charlett Nose M.D.   On: 05/09/2015 14:52   Dg Femur Min 2 Views Left  05/06/2015  CLINICAL DATA:  Left leg gave out.  Pain in left hip. EXAM: LEFT FEMUR 2 VIEWS COMPARISON:  Pelvis 05/06/2015 FINDINGS: Displaced fracture of the proximal left femur at the junction of the left femoral head and neck. This is compatible with a subcapital fracture. Proximal displacement of the distal fragment. Left femoral head is located. Visualized pelvic bony structures are intact. The distal left femur is intact. Patient has a right hip arthroplasty which is partially visualized. IMPRESSION: Subcapital fracture of the proximal left femur. Electronically Signed   By: Richarda Overlie M.D.    On: 05/06/2015 13:58    Assessment/Plan  Physical deconditioning Will have him work with physical therapy and occupational therapy team to help with gait training and muscle strengthening exercises.fall precautions. Skin care. Encourage to be out of bed.   Left hip fracture S/p left hip hemiarthroplasty. Family would like for him to receive only tylenol for pain, discontinue norco per family request. Continue tylenol 1000 mg tid for now. Has f/u with orthopedics. Will have patient work with PT/OT as tolerated to regain strength and restore function.  Fall precautions are in place. Continue zanaflex but change this to 2 mg tid prn only and monitor. Continue coumadin for dvt prophylaxis  Copd Breathing stable. Continue bronchodilators and monitor clinically  Leukocytosis Afebrile. Had received prednisone in hospital. No signs of infection on exam. Check cbc with diff  Blood loss anemia Post op, monitor cbc  ckd stage 3 Monitor bmp  HTN bp stable, infact on lower side of normal. Discontinue norvasc. Continue hctz 12.5 mg daily and check bp bid  afib Rate controlled. Continue coumadin for anticoagulation. inr today 1.2. Change coumadin to 6 mg daily and check inr 05/14/15  Ectropion Patient was taking polymixin eye ointment bid, restart this  Glaucoma Continue his latanoprost eye drops  Hypothyroidism Continue home regimen synthroid for now  gerd Continue protonix  Constipation Continue miralax for now   Goals of care: short term rehabilitation   Labs/tests ordered: cbc with diff, cmp  Family/ staff Communication: reviewed care plan with patient's daughter and nursing supervisor    Oneal Grout, MD Internal Medicine [facility] [phone]  [address] [place], [place] [address] Cell Phone (Monday-Friday 8 am - 5 pm): [phone] On Call: [phone] and follow prompts after 5 pm and on weekends Office Phone: [phone] Office Fax:  [phone]

## 2015-05-13 LAB — CULTURE, BLOOD (ROUTINE X 2)
CULTURE: NO GROWTH
Culture: NO GROWTH

## 2015-05-14 ENCOUNTER — Encounter: Payer: Self-pay | Admitting: Adult Health

## 2015-05-14 ENCOUNTER — Non-Acute Institutional Stay (SKILLED_NURSING_FACILITY): Payer: Medicare Other | Admitting: Adult Health

## 2015-05-14 DIAGNOSIS — E038 Other specified hypothyroidism: Secondary | ICD-10-CM

## 2015-05-14 DIAGNOSIS — H02106 Unspecified ectropion of left eye, unspecified eyelid: Secondary | ICD-10-CM

## 2015-05-14 DIAGNOSIS — N183 Chronic kidney disease, stage 3 unspecified: Secondary | ICD-10-CM

## 2015-05-14 DIAGNOSIS — I10 Essential (primary) hypertension: Secondary | ICD-10-CM

## 2015-05-14 DIAGNOSIS — J441 Chronic obstructive pulmonary disease with (acute) exacerbation: Secondary | ICD-10-CM

## 2015-05-14 DIAGNOSIS — D62 Acute posthemorrhagic anemia: Secondary | ICD-10-CM

## 2015-05-14 DIAGNOSIS — I480 Paroxysmal atrial fibrillation: Secondary | ICD-10-CM | POA: Diagnosis not present

## 2015-05-14 DIAGNOSIS — R5381 Other malaise: Secondary | ICD-10-CM | POA: Diagnosis not present

## 2015-05-14 DIAGNOSIS — S72142S Displaced intertrochanteric fracture of left femur, sequela: Secondary | ICD-10-CM | POA: Diagnosis not present

## 2015-05-14 DIAGNOSIS — K5900 Constipation, unspecified: Secondary | ICD-10-CM

## 2015-05-14 DIAGNOSIS — H409 Unspecified glaucoma: Secondary | ICD-10-CM | POA: Diagnosis not present

## 2015-05-14 DIAGNOSIS — Z7901 Long term (current) use of anticoagulants: Secondary | ICD-10-CM

## 2015-05-14 DIAGNOSIS — K219 Gastro-esophageal reflux disease without esophagitis: Secondary | ICD-10-CM

## 2015-05-14 DIAGNOSIS — D72829 Elevated white blood cell count, unspecified: Secondary | ICD-10-CM | POA: Diagnosis not present

## 2015-05-14 NOTE — Progress Notes (Signed)
Patient ID: Jordan [name], male   DOB: 01-22-1919, 82 y.o.   MRN: [id number]    DATE:  05/14/2015   MRN:  [id number]  BIRTHDAY: 1918/06/01  Facility:  Nursing Home Location:  [facility]  Nursing Home Room Number: 1205-P  LEVEL OF CARE:  SNF [phone])  Contact Information    Name Relation Home Work Mobile   East,Katrine Daughter [phone]     Matthias, Bogus   [phone]   Leanord, Thibeau Daughter [phone]  [phone]       Code Status History    Date Active Date Inactive Code Status Order ID Comments User Context   05/06/2015  5:35 PM 05/11/2015  8:39 PM Full Code [id number]  Starleen Arms, MD Inpatient   07/10/2013  6:58 PM 07/11/2013  6:41 PM Full Code [id number]  Christiane Ha, MD Inpatient   04/25/2013  8:11 PM 04/28/2013  6:44 PM Full Code [id number]  Allena Katz, PA-C Inpatient   04/24/2013  7:53 PM 04/25/2013  8:11 PM Full Code [id number]  Vassie Loll, MD Inpatient       Chief Complaint  Patient presents with  . Discharge Note    HISTORY OF PRESENT ILLNESS:    This is a 82 year old male who is for discharge home with Home health PT and OT. He has been admitted to [facility] on 05/11/15 from [facility]. He has PMH of Atrial fibrillation, CHF and CKD stage 3. He had a fall and sustained a left hip fracture for which he had left hip hemiarthroplasty. Hospital stay was complicated with acute on chronic renal failure and was given IV fluids. He had COPD exacerbation and was given Prednisone.  Patient was admitted to this facility for short-term rehabilitation after the patient's recent hospitalization.  Patient has completed SNF rehabilitation and therapy has cleared the patient for discharge.  PAST MEDICAL HISTORY:  Past Medical History  Diagnosis Date  . GERD (gastroesophageal reflux disease)   . Hypertension   . COPD (chronic obstructive pulmonary disease) (HCC)   . Ventricular bigeminy   . PVC (premature ventricular  contraction)   . Dyspnea   . Thyroid disease   . CHF (congestive heart failure) (HCC)   . Family history of adverse reaction to anesthesia     " daughter has difficulty waking "  . Physical deconditioning   . Intertrochanteric fracture of left hip (HCC)   . Leukocytosis   . Acute blood loss anemia   . CKD (chronic kidney disease) stage 3, GFR 30-59 ml/min   . Chronic diastolic heart failure (HCC)   . Paroxysmal atrial fibrillation (HCC)   . Ectropion   . Constipation      CURRENT MEDICATIONS: Reviewed  Patient's Medications  New Prescriptions   No medications on file  Previous Medications   ACETAMINOPHEN (TYLENOL) 500 MG TABLET    Take 1,000 mg by mouth 3 (three) times daily.    ALBUTEROL (PROVENTIL HFA;VENTOLIN HFA) 108 (90 BASE) MCG/ACT INHALER    Inhale 2 puffs into the lungs every 6 (six) hours as needed for wheezing.   ALBUTEROL (PROVENTIL) (2.5 MG/3ML) 0.083% NEBULIZER SOLUTION    Take 3 mLs (2.5 mg total) by nebulization every 2 (two) hours as needed for wheezing or shortness of breath.   GUAIFENESIN (MUCINEX) 600 MG 12 HR TABLET    Take 1 tablet (600 mg total) by mouth 2 (two) times daily.   HYDROCHLOROTHIAZIDE (HYDRODIURIL) 25 MG TABLET    Take 12.5 mg by  mouth daily.    IPRATROPIUM (ATROVENT) 0.02 % NEBULIZER SOLUTION    Take 2.5 mLs (0.5 mg total) by nebulization 2 (two) times daily.   LATANOPROST (XALATAN) 0.005 % OPHTHALMIC SOLUTION    Place 1 drop into both eyes at bedtime. For glaucoma   LEVALBUTEROL (XOPENEX) 0.63 MG/3ML NEBULIZER SOLUTION    Take 3 mLs (0.63 mg total) by nebulization 2 (two) times daily.   LEVOTHYROXINE (SYNTHROID, LEVOTHROID) 50 MCG TABLET    Take 50 mcg by mouth daily before breakfast.   OXYTETRACYCLINE-POLYMYXIN B (TERRAMYCIN/POLYMYXIN B SULFATE OP)    Apply 1 application to eye 2 (two) times daily. Left eye ectropion   PANTOPRAZOLE (PROTONIX) 40 MG TABLET    Take 1 tablet (40 mg total) by mouth daily.   POLYETHYLENE GLYCOL (MIRALAX / GLYCOLAX)  PACKET    Take 17 g by mouth daily.   TIZANIDINE (ZANAFLEX) 2 MG CAPSULE    Take 1 capsule (2 mg total) by mouth 3 (three) times daily.   WARFARIN (COUMADIN) 6 MG TABLET    Take 6 mg by mouth daily.  Modified Medications   No medications on file  Discontinued Medications   AMLODIPINE (NORVASC) 2.5 MG TABLET    Take 2.5 mg by mouth daily.   HYDROCODONE-ACETAMINOPHEN (NORCO) 5-325 MG TABLET    Take 1 tablet by mouth every 6 (six) hours as needed.   WARFARIN (COUMADIN) 2.5 MG TABLET    Take 2.5 mg by mouth daily.     Allergies  Allergen Reactions  . Diltiazem Hcl Er Beads Other (See Comments)    Tachycardia  . Metoprolol Succinate Er [Metoprolol Tartrate] Other (See Comments)    hallucinations     REVIEW OF SYSTEMS:  GENERAL: no change in appetite, no fatigue, no weight changes, no fever, chills or weakness  EYES: Denies change in vision, dry eyes, eye pain, itching or discharge EARS: Denies change in hearing, ringing in ears, or earache NOSE: Denies nasal congestion or epistaxis MOUTH and THROAT: Denies oral discomfort, gingival pain or bleeding, pain from teeth or hoarseness   RESPIRATORY: no cough, SOB, DOE, wheezing, hemoptysis CARDIAC: no chest pain, edema or palpitations GI: no abdominal pain, diarrhea, constipation, heart burn, nausea or vomiting GU: Denies dysuria, frequency, hematuria, incontinence, or discharge PSYCHIATRIC: Denies feeling of depression or anxiety. No report of hallucinations, insomnia, paranoia, or agitation   PHYSICAL EXAMINATION  GENERAL APPEARANCE: Well nourished. In no acute distress. Normal body habitus SKIN:  Skin is warm and dry. Left hip surgical incision is dry, no redness, covered with dry dressing HEAD: Normal in size and contour. No evidence of trauma EYES: Lids open and close normally. No blepharitis, entropion or ectropion. PERRL. Conjunctivae are clear and sclerae are white. Lenses are without opacity EARS: Pinnae are normal. Patient  hears normal voice tunes of the examiner MOUTH and THROAT: Lips are without lesions. Oral mucosa is moist and without lesions. Tongue is normal in shape, size, and color and without lesions NECK: supple, trachea midline, no neck masses, no thyroid tenderness, no thyromegaly LYMPHATICS: no LAN in the neck, no supraclavicular LAN RESPIRATORY: breathing is even & unlabored, BS CTAB CARDIAC: RRR, no murmur,no extra heart sounds, no edema GI: abdomen soft, normal BS, no masses, no tenderness, no hepatomegaly, no splenomegaly EXTREMITIES:  Able to move X 4 extremities PSYCHIATRIC: Alert and oriented X 3. Affect and behavior are appropriate  LABS/RADIOLOGY: Labs reviewed: Basic Metabolic Panel:  Recent Labs  91/47/8202/25/17 0516 05/09/15 0348 05/09/15 0950 05/10/15 05/10/15 0431  NA 137 139  --  138 138  K 4.5 3.9  --   --  4.1  CL 110 108  --   --  105  CO2 21* 22  --   --  23  GLUCOSE 157* 101*  --   --  106*  BUN 39* 40*  --  34* 34*  CREATININE 2.09* 1.89*  --  1.7* 1.68*  CALCIUM 7.9* 7.9*  --   --  8.0*  MG  --   --  1.5*  --  2.0   Liver Function Tests:  Recent Labs  05/06/15 1407  AST 25  ALT 15*  ALKPHOS 87  BILITOT 0.8  PROT 6.3*  ALBUMIN 3.6   CBC:  Recent Labs  05/06/15 1448  05/08/15 0516 05/09/15 0348 05/10/15 05/10/15 0431  WBC 16.1*  < > 17.5* 20.4* 15.6 15.6*  NEUTROABS 11.6*  --   --   --   --  9.6*  HGB 15.0  < > 12.8* 12.0*  --  11.8*  HCT 41.8  < > 36.4* 32.9*  --  33.4*  MCV 96.8  < > 97.1 96.5  --  96.5  PLT 216  < > 196 182  --  208  < > = values in this interval not displayed.    Dg Chest 1 View  05/06/2015  CLINICAL DATA:  Larey Seat today while leaving church.  Syncopal episode. EXAM: CHEST 1 VIEW COMPARISON:  07/10/2013. FINDINGS: The heart is upper limits of normal in size given the AP projection and supine position of the patient. There is tortuosity and calcification of the thoracic aorta. The lungs demonstrate emphysematous changes and  pulmonary scarring with hyperinflation. Stable scarring changes at the right lung base. No acute overlying pulmonary process. The bony thorax is intact. IMPRESSION: Chronic emphysematous changes and pulmonary scarring. No acute pulmonary findings. Electronically Signed   By: Rudie Meyer M.D.   On: 05/06/2015 14:06   Dg Pelvis 1-2 Views  05/06/2015  CLINICAL DATA:  Fall, left hip pain EXAM: PELVIS - 1-2 VIEW COMPARISON:  04/25/2013 FINDINGS: Single frontal view of the pelvis submitted. Again noted right hip prosthesis with anatomic alignment. There is displaced mild impacted fracture of the left femoral neck. Diffuse osteopenia. IMPRESSION: Displaced mild impacted fracture of the left femoral neck. Electronically Signed   By: Natasha Mead M.D.   On: 05/06/2015 13:59   Pelvis Portable  05/07/2015  CLINICAL DATA:  Closed left hip fracture EXAM: PORTABLE PELVIS 1-2 VIEWS COMPARISON:  05/06/2015 FINDINGS: Single frontal view of the pelvis submitted. There is new left hip prosthesis with anatomic alignment. Stable right hip prosthesis. Small amount of postsurgical periarticular soft tissue air left hip region. IMPRESSION: New left hip prosthesis with anatomic alignment. Stable right hip prosthesis. Postsurgical changes left hip region. Electronically Signed   By: Natasha Mead M.D.   On: 05/07/2015 16:53   Dg Chest Port 1 View  05/10/2015  CLINICAL DATA:  Acute onset of shortness of breath. Initial encounter. EXAM: PORTABLE CHEST 1 VIEW COMPARISON:  Chest radiograph performed 05/09/2015 FINDINGS: The lungs remain hyperexpanded, with flattening of the hemidiaphragms, compatible with COPD. Mild right basilar opacity reflects chronic fibrosis, as previously noted. Pulmonary vascularity is at the upper limits of normal. No pleural effusion or pneumothorax is seen. The cardiomediastinal silhouette is borderline normal in size. No acute osseous abnormalities are identified. IMPRESSION: Findings of COPD, with chronic  fibrosis. No definite superimposed airspace consolidation seen. Electronically Signed  By: Roanna Raider M.D.   On: 05/10/2015 04:16   Dg Chest Port 1v Same Day  05/09/2015  CLINICAL DATA:  Shortness of breath. EXAM: PORTABLE CHEST 1 VIEW COMPARISON:  05/06/2015 FINDINGS: There is hyperinflation of the lungs compatible with COPD. Heart is borderline in size. Chronic density at the right lung base compatible with scarring. No acute airspace opacities or effusions. No acute bony abnormality. IMPRESSION: COPD.  Chronic changes.  No active disease. Electronically Signed   By: Charlett Nose M.D.   On: 05/09/2015 14:52   Dg Femur Min 2 Views Left  05/06/2015  CLINICAL DATA:  Left leg gave out.  Pain in left hip. EXAM: LEFT FEMUR 2 VIEWS COMPARISON:  Pelvis 05/06/2015 FINDINGS: Displaced fracture of the proximal left femur at the junction of the left femoral head and neck. This is compatible with a subcapital fracture. Proximal displacement of the distal fragment. Left femoral head is located. Visualized pelvic bony structures are intact. The distal left femur is intact. Patient has a right hip arthroplasty which is partially visualized. IMPRESSION: Subcapital fracture of the proximal left femur. Electronically Signed   By: Richarda Overlie M.D.   On: 05/06/2015 13:58    ASSESSMENT/PLAN:  Physical deconditioning -  for Home health PT and OT  Left hip fracture S/P left hip hemiarthroplasty - continue Tylenol ES 500 mg take 2 tabs = 1,000 mg PO TID for pain; Zanaflex 2 mg 1 capsule by mouth every 8 hours when necessary for muscle spasm and Coumadin for DVT prophylaxis  COPD - no SOB; continue Albuterol nebulization PRN, Proventil HFA PRN, Ipratropium Bromide BID and Xopenex nebs BID  Anemia, acute blood loss - hgb 11.8, stable  Leukocytosis - wbc 15.6; for re-check   CKD, stage 3 - creatinine 1.68; for re-check creatinine  Hypertension - well-controlled; continue HCTZ 12.5 mg daily  Atrial Fibrillation -  rate-controlled; continue Coumadin  Long-term use of anticoagulant - INR  1.6, subtherapeutic; continue Coumadin 6 mg PO daily and re-check INR on 05/17/15  Ectropion - continue Polymixin Bacitracin 500 - 10000 units/gm ointment 1 application to left eye BID  Glaucoma - continue Xalatan 0.005% 1 drop into both Q HS  Hypothyroidism -  Continue Synthroid 50 mcg 1 tab PO Q D  GERD - stable; continue Protonix 40 mg 1 tab PO Q D  Constipation - continue Miralax 17 gm PO daily       I have filled out patient's discharge paperwork and written prescriptions.  Patient will receive home health PT and OT.  Total discharge time: Less than 30 minutes  Discharge time involved coordination of the discharge process with Child psychotherapist, nursing staff and therapy department. Medical justification for home health services verified.   [facility], NP [employer] [phone]

## 2015-05-17 ENCOUNTER — Ambulatory Visit: Payer: Medicare Other | Admitting: Cardiology

## 2015-05-21 ENCOUNTER — Ambulatory Visit (INDEPENDENT_AMBULATORY_CARE_PROVIDER_SITE_OTHER): Payer: Medicare Other | Admitting: Pharmacist

## 2015-05-21 DIAGNOSIS — I480 Paroxysmal atrial fibrillation: Secondary | ICD-10-CM

## 2015-05-21 DIAGNOSIS — Z5181 Encounter for therapeutic drug level monitoring: Secondary | ICD-10-CM

## 2015-05-21 LAB — POCT INR: INR: 4.6

## 2015-05-28 ENCOUNTER — Ambulatory Visit (INDEPENDENT_AMBULATORY_CARE_PROVIDER_SITE_OTHER): Payer: Medicare Other | Admitting: Internal Medicine

## 2015-05-28 DIAGNOSIS — I480 Paroxysmal atrial fibrillation: Secondary | ICD-10-CM

## 2015-05-28 DIAGNOSIS — Z5181 Encounter for therapeutic drug level monitoring: Secondary | ICD-10-CM

## 2015-05-28 LAB — POCT INR: INR: 2.8

## 2015-05-28 NOTE — Progress Notes (Signed)
This encounter was created in error - please disregard.

## 2015-06-02 ENCOUNTER — Telehealth: Payer: Self-pay | Admitting: Cardiology

## 2015-06-02 NOTE — Telephone Encounter (Signed)
LMOM for dtr to callback. Please note that the pt has an appt with [facility] & Dr. [name] on the same day.

## 2015-06-02 NOTE — Telephone Encounter (Signed)
New message      Question about next coumadin appt----should he be seen sooner than scheduled appt

## 2015-06-02 NOTE — Telephone Encounter (Signed)
Spoke with pt's daughter.  Okay to check Coumadin on 4/6 with Dr. Judd GaudierSkain's appt.  He has been discharged from home health.

## 2015-06-17 ENCOUNTER — Ambulatory Visit (INDEPENDENT_AMBULATORY_CARE_PROVIDER_SITE_OTHER): Payer: Medicare Other | Admitting: Cardiology

## 2015-06-17 ENCOUNTER — Encounter: Payer: Self-pay | Admitting: Cardiology

## 2015-06-17 ENCOUNTER — Ambulatory Visit (INDEPENDENT_AMBULATORY_CARE_PROVIDER_SITE_OTHER): Payer: Medicare Other

## 2015-06-17 VITALS — BP 128/72 | HR 54 | Ht 65.0 in | Wt 179.0 lb

## 2015-06-17 DIAGNOSIS — Z7901 Long term (current) use of anticoagulants: Secondary | ICD-10-CM | POA: Diagnosis not present

## 2015-06-17 DIAGNOSIS — R001 Bradycardia, unspecified: Secondary | ICD-10-CM

## 2015-06-17 DIAGNOSIS — R55 Syncope and collapse: Secondary | ICD-10-CM

## 2015-06-17 DIAGNOSIS — Z5181 Encounter for therapeutic drug level monitoring: Secondary | ICD-10-CM

## 2015-06-17 DIAGNOSIS — I4891 Unspecified atrial fibrillation: Secondary | ICD-10-CM

## 2015-06-17 DIAGNOSIS — I480 Paroxysmal atrial fibrillation: Secondary | ICD-10-CM | POA: Diagnosis not present

## 2015-06-17 LAB — POCT INR: INR: 4.9

## 2015-06-17 MED ORDER — HYDROCHLOROTHIAZIDE 25 MG PO TABS
12.5000 mg | ORAL_TABLET | Freq: Every day | ORAL | Status: DC
Start: 2015-06-17 — End: 2016-06-17

## 2015-06-17 NOTE — Patient Instructions (Signed)

## 2015-06-17 NOTE — Progress Notes (Signed)
[address] [place], [place]  [address] Phone: [phone] Fax:  [phone]  Date:  06/17/2015   ID:  Jordan [name], DOB May 25, 1918, MRN [id number]  PCP:  Kaleen Mask, MD   History of Present Illness: Jordan [name] is a 82 y.o. male with paroxysmal atrial fibrillation with hospitalization on 12/16/12 with diastolic heart failure, very frequent PVCs, ventricular bigeminy with symptomatic bradycardia, had to stop flecainide and atenolol.  PVCs 28,000 and on 24-hour Holter monitor 8/12, normal ejection fraction with chronic dyspnea, cough, wheeze.   He saw Tereso Newcomer on 07/14/13 after hospitalization 4/30 through 5/1 after a witnessed syncopal episode. He had sensation of lightheadedness prior to the episode. Heart rate was reportedly in the 30s and his atenolol was stopped. His heart rate improved to greater than 16 was discharged home.  He was sitting at church and suddenly felt lightheaded, next recollection was rescue workers helping him out. He had another episode of syncope on the way to the hospital. Has felt well since stopping beta blocker.  In February of 2015 broke his hip and he works with a walker.  In Feb 2017 broke other hip. This is unfortunate. No syncope.  He denies any shortness of breath, chest pain, palpitations.  Tumor left ankle.   In the past when he was first diagnosed with paroxysmal atrial fibrillation, Dr. Johney Frame saw him in consultation and recommended flecainide, low-dose to see if this would help with both his atrial fibrillation as well as PVC burden, however he did not tolerate these medications because of his significant bradycardia. Given his prior bradycardia, we will be avoiding.   Overall other than shortness of breath with minimal exertion, he is doing well.   Wt Readings from Last 3 Encounters:  06/17/15 179 lb (81.194 kg)  05/14/15 182 lb (82.555 kg)  05/12/15 182 lb 3.2 oz (82.645 kg)     Past Medical  History  Diagnosis Date  . GERD (gastroesophageal reflux disease)   . Hypertension   . COPD (chronic obstructive pulmonary disease) (HCC)   . Ventricular bigeminy   . PVC (premature ventricular contraction)   . Dyspnea   . Thyroid disease   . CHF (congestive heart failure) (HCC)   . Family history of adverse reaction to anesthesia     " daughter has difficulty waking "  . Physical deconditioning   . Intertrochanteric fracture of left hip (HCC)   . Leukocytosis   . Acute blood loss anemia   . CKD (chronic kidney disease) stage 3, GFR 30-59 ml/min   . Chronic diastolic heart failure (HCC)   . Paroxysmal atrial fibrillation (HCC)   . Ectropion   . Constipation     Past Surgical History  Procedure Laterality Date  . Cholecystectomy    . Splenectomy, partial    . Esophagogastroduodenoscopy N/A 04/26/2013    Procedure: ESOPHAGOGASTRODUODENOSCOPY (EGD);  Surgeon: Florencia Reasons, MD;  Location: [facility] ENDOSCOPY;  Service: Endoscopy;  Laterality: N/A;  . Esophagogastroduodenoscopy (egd) with esophageal dilation N/A 04/26/2013    Procedure: ESOPHAGOGASTRODUODENOSCOPY (EGD) WITH ESOPHAGEAL DILATION;  Surgeon: Florencia Reasons, MD;  Location: [facility] ENDOSCOPY;  Service: Endoscopy;  Laterality: N/A;  . Hip arthroplasty Right 04/25/2013    Procedure: ARTHROPLASTY BIPOLAR HIP;  Surgeon: Nestor Lewandowsky, MD;  Location: [facility] OR;  Service: Orthopedics;  Laterality: Right;  . Hip arthroplasty Left 05/07/2015    Procedure: LEFT HEMI-HIP ARTHROPLASTY CEMENTED;  Surgeon: Gean Birchwood, MD;  Location: [facility] OR;  Service: Orthopedics;  Laterality: Left;    Current Outpatient Prescriptions  Medication Sig Dispense Refill  . acetaminophen (TYLENOL) 500 MG tablet Take 1,000 mg by mouth 3 (three) times daily.     Marland Kitchen. albuterol (PROVENTIL HFA;VENTOLIN HFA) 108 (90 BASE) MCG/ACT inhaler Inhale 2 puffs into the lungs every 6 (six) hours as needed for wheezing.    Marland Kitchen. albuterol (PROVENTIL) (2.5 MG/3ML) 0.083% nebulizer solution  Take 3 mLs (2.5 mg total) by nebulization every 2 (two) hours as needed for wheezing or shortness of breath.    . guaiFENesin (MUCINEX) 600 MG 12 hr tablet Take 1 tablet (600 mg total) by mouth 2 (two) times daily.    . hydrochlorothiazide (HYDRODIURIL) 25 MG tablet Take 12.5 mg by mouth daily.     Marland Kitchen. ipratropium (ATROVENT) 0.02 % nebulizer solution Take 2.5 mLs (0.5 mg total) by nebulization 2 (two) times daily.    Marland Kitchen. latanoprost (XALATAN) 0.005 % ophthalmic solution Place 1 drop into both eyes at bedtime. For glaucoma    . levalbuterol (XOPENEX) 0.63 MG/3ML nebulizer solution Take 3 mLs (0.63 mg total) by nebulization 2 (two) times daily.    Marland Kitchen. levothyroxine (SYNTHROID, LEVOTHROID) 50 MCG tablet Take 50 mcg by mouth daily before breakfast.    . Oxytetracycline-Polymyxin B (TERRAMYCIN/POLYMYXIN B SULFATE OP) Apply 1 application to eye 2 (two) times daily. Left eye ectropion    . pantoprazole (PROTONIX) 40 MG tablet Take 1 tablet (40 mg total) by mouth daily.    . polyethylene glycol (MIRALAX / GLYCOLAX) packet Take 17 g by mouth daily.    . tizanidine (ZANAFLEX) 2 MG capsule Take 1 capsule (2 mg total) by mouth 3 (three) times daily. 60 capsule 0  . WARFARIN SODIUM PO Take by mouth. By mouth daily as directed by coumadin clinc     No current facility-administered medications for this visit.    Allergies:    Allergies  Allergen Reactions  . Diltiazem Hcl Er Beads Other (See Comments)    Tachycardia  . Metoprolol Succinate Er [Metoprolol Tartrate] Other (See Comments)    hallucinations    Social History:  The patient  reports that he has quit smoking. He has never used smokeless tobacco. He reports that he does not drink alcohol or use illicit drugs.   ROS:  Please see the history of present illness.   Chronic diarrhea. Positive for dyspnea. No chest pain. No syncope. Fatigue. No bleeding.   All other systems reviewed and negative.   PHYSICAL EXAM: VS:  BP 128/72 mmHg  Pulse 54  Ht 5\' 5"   (1.651 m)  Wt 179 lb (81.194 kg)  BMI 29.79 kg/m2 Elderly Well nourished, well developed, in no acute distress HEENT: normal Neck: no JVD Cardiac:  normal S1, S2; RRR; rare ectopy, no murmur Lungs:  clear to auscultation bilaterally, occasional scattered wheezing, no rhonchi or rales Abd: soft, nontender, no hepatomegaly Ext: Left leg edema, ankle tumor.  Skin: warm and dry Neuro: no focal abnormalities noted  EKG:  EKG-10/29/14-sinus rhythm, 73, PVC noted As recent hospital EKG with sinus rhythm and PVCs. No further atrial fibrillation.     Event monitor-: 07/18/13 episode of atrial fibrillation with heart rate of 130 at 7:40 AM, transient. This is while being off of flecainide and atenolol.  ASSESSMENT AND PLAN:  1. Syncope-hospitalization 5/15-discontinuation of atenolol, flecainide. Doing well. He thinks he may have just gone to sleep. With his heart rate of 30, I'm fine without medications as above.  Hopefully he will not have any further episodes of A. fib with RVR. In fact, since stopping his medications, he has more energy, more alert, less sleeping. 2. Paroxysmal atrial fibrillation-currently on warfarin as anticoagulation. Cost was an issue. Previously was on flecainide but this was stopped after bradycardia. Also atenolol was stopped as well.  I am willing to tolerate an occasional episode of A. fib with RVR given his previous bradycardia. Continue to monitor. Doing well. 3. Chronic anticoagulation-cost of Eliquis was too high. He is now back on warfarin. Levels are being checked here in the clinic. Stable. No bleeding. 4. Chronic diastolic heart failure-originally in hospital he appear to be intravascular volume depleted with an increased creatinine. Avoiding NSAIDs  I do not wish to over diurese him with increasing Lasix. Continue with current treatment strategy. Leg edema is chronic. HCTZ present. 5. COPD-continue with inhalers. This also can be playing a role.  He smoked for 50  years. Shortness of breath 6. Diarrhea-at times he still is having difficulty with this.This has not had a temporal association with any new medications. His wife assures me.  7. 8-month followup  Signed, Donato Schultz, MD [facility]  06/17/2015 1:45 PM

## 2015-06-28 ENCOUNTER — Ambulatory Visit (INDEPENDENT_AMBULATORY_CARE_PROVIDER_SITE_OTHER): Payer: Medicare Other | Admitting: Pharmacist

## 2015-06-28 DIAGNOSIS — I480 Paroxysmal atrial fibrillation: Secondary | ICD-10-CM | POA: Diagnosis not present

## 2015-06-28 DIAGNOSIS — I4891 Unspecified atrial fibrillation: Secondary | ICD-10-CM

## 2015-06-28 DIAGNOSIS — Z5181 Encounter for therapeutic drug level monitoring: Secondary | ICD-10-CM | POA: Diagnosis not present

## 2015-06-28 LAB — POCT INR: INR: 2.9

## 2015-07-19 ENCOUNTER — Ambulatory Visit (INDEPENDENT_AMBULATORY_CARE_PROVIDER_SITE_OTHER): Payer: Medicare Other | Admitting: *Deleted

## 2015-07-19 DIAGNOSIS — I4891 Unspecified atrial fibrillation: Secondary | ICD-10-CM | POA: Diagnosis not present

## 2015-07-19 DIAGNOSIS — Z5181 Encounter for therapeutic drug level monitoring: Secondary | ICD-10-CM

## 2015-07-19 DIAGNOSIS — I480 Paroxysmal atrial fibrillation: Secondary | ICD-10-CM | POA: Diagnosis not present

## 2015-07-19 LAB — POCT INR: INR: 2

## 2015-08-16 ENCOUNTER — Ambulatory Visit (INDEPENDENT_AMBULATORY_CARE_PROVIDER_SITE_OTHER): Payer: Medicare Other | Admitting: *Deleted

## 2015-08-16 DIAGNOSIS — I4891 Unspecified atrial fibrillation: Secondary | ICD-10-CM | POA: Diagnosis not present

## 2015-08-16 DIAGNOSIS — I480 Paroxysmal atrial fibrillation: Secondary | ICD-10-CM

## 2015-08-16 DIAGNOSIS — Z5181 Encounter for therapeutic drug level monitoring: Secondary | ICD-10-CM

## 2015-08-16 LAB — POCT INR: INR: 2.2

## 2015-09-20 ENCOUNTER — Ambulatory Visit (INDEPENDENT_AMBULATORY_CARE_PROVIDER_SITE_OTHER): Payer: Medicare Other

## 2015-09-20 DIAGNOSIS — Z5181 Encounter for therapeutic drug level monitoring: Secondary | ICD-10-CM

## 2015-09-20 DIAGNOSIS — I4891 Unspecified atrial fibrillation: Secondary | ICD-10-CM

## 2015-09-20 DIAGNOSIS — I480 Paroxysmal atrial fibrillation: Secondary | ICD-10-CM | POA: Diagnosis not present

## 2015-09-20 LAB — POCT INR: INR: 2

## 2015-10-04 ENCOUNTER — Other Ambulatory Visit: Payer: Self-pay | Admitting: Cardiology

## 2015-11-01 ENCOUNTER — Ambulatory Visit (INDEPENDENT_AMBULATORY_CARE_PROVIDER_SITE_OTHER): Payer: Medicare Other

## 2015-11-01 DIAGNOSIS — I4891 Unspecified atrial fibrillation: Secondary | ICD-10-CM

## 2015-11-01 DIAGNOSIS — Z5181 Encounter for therapeutic drug level monitoring: Secondary | ICD-10-CM | POA: Diagnosis not present

## 2015-11-01 LAB — POCT INR: INR: 1.6

## 2015-11-22 ENCOUNTER — Ambulatory Visit (INDEPENDENT_AMBULATORY_CARE_PROVIDER_SITE_OTHER): Payer: Medicare Other | Admitting: *Deleted

## 2015-11-22 DIAGNOSIS — I4891 Unspecified atrial fibrillation: Secondary | ICD-10-CM

## 2015-11-22 DIAGNOSIS — Z5181 Encounter for therapeutic drug level monitoring: Secondary | ICD-10-CM | POA: Diagnosis not present

## 2015-11-22 LAB — POCT INR: INR: 2.5

## 2015-12-20 ENCOUNTER — Ambulatory Visit (INDEPENDENT_AMBULATORY_CARE_PROVIDER_SITE_OTHER): Payer: Medicare Other | Admitting: *Deleted

## 2015-12-20 DIAGNOSIS — I4891 Unspecified atrial fibrillation: Secondary | ICD-10-CM

## 2015-12-20 DIAGNOSIS — Z5181 Encounter for therapeutic drug level monitoring: Secondary | ICD-10-CM

## 2015-12-20 LAB — POCT INR: INR: 2.5

## 2016-01-24 ENCOUNTER — Ambulatory Visit (INDEPENDENT_AMBULATORY_CARE_PROVIDER_SITE_OTHER): Payer: Medicare Other | Admitting: *Deleted

## 2016-01-24 DIAGNOSIS — I4891 Unspecified atrial fibrillation: Secondary | ICD-10-CM | POA: Diagnosis not present

## 2016-01-24 DIAGNOSIS — Z5181 Encounter for therapeutic drug level monitoring: Secondary | ICD-10-CM | POA: Diagnosis not present

## 2016-01-24 LAB — POCT INR: INR: 2.4

## 2016-02-14 ENCOUNTER — Other Ambulatory Visit: Payer: Self-pay | Admitting: Cardiology

## 2016-03-03 ENCOUNTER — Ambulatory Visit (INDEPENDENT_AMBULATORY_CARE_PROVIDER_SITE_OTHER): Payer: Medicare Other | Admitting: *Deleted

## 2016-03-03 DIAGNOSIS — I4891 Unspecified atrial fibrillation: Secondary | ICD-10-CM

## 2016-03-03 DIAGNOSIS — Z5181 Encounter for therapeutic drug level monitoring: Secondary | ICD-10-CM | POA: Diagnosis not present

## 2016-03-03 LAB — POCT INR: INR: 2.4

## 2016-04-10 ENCOUNTER — Ambulatory Visit (INDEPENDENT_AMBULATORY_CARE_PROVIDER_SITE_OTHER): Payer: Medicare Other

## 2016-04-10 DIAGNOSIS — I4891 Unspecified atrial fibrillation: Secondary | ICD-10-CM

## 2016-04-10 DIAGNOSIS — Z5181 Encounter for therapeutic drug level monitoring: Secondary | ICD-10-CM

## 2016-04-10 LAB — POCT INR: INR: 3.2

## 2016-05-08 ENCOUNTER — Ambulatory Visit (INDEPENDENT_AMBULATORY_CARE_PROVIDER_SITE_OTHER): Payer: Medicare Other | Admitting: *Deleted

## 2016-05-08 DIAGNOSIS — Z5181 Encounter for therapeutic drug level monitoring: Secondary | ICD-10-CM

## 2016-05-08 DIAGNOSIS — I4891 Unspecified atrial fibrillation: Secondary | ICD-10-CM | POA: Diagnosis not present

## 2016-05-08 LAB — POCT INR: INR: 2.4

## 2016-06-17 ENCOUNTER — Other Ambulatory Visit: Payer: Self-pay | Admitting: Cardiology

## 2016-06-19 ENCOUNTER — Ambulatory Visit (INDEPENDENT_AMBULATORY_CARE_PROVIDER_SITE_OTHER): Payer: Medicare Other | Admitting: *Deleted

## 2016-06-19 DIAGNOSIS — Z5181 Encounter for therapeutic drug level monitoring: Secondary | ICD-10-CM | POA: Diagnosis not present

## 2016-06-19 DIAGNOSIS — I4891 Unspecified atrial fibrillation: Secondary | ICD-10-CM | POA: Diagnosis not present

## 2016-06-19 LAB — POCT INR: INR: 2.7

## 2016-06-19 MED ORDER — WARFARIN SODIUM 2.5 MG PO TABS: ORAL_TABLET | ORAL | 3 refills | Status: DC

## 2016-07-03 ENCOUNTER — Ambulatory Visit
Admission: RE | Admit: 2016-07-03 | Discharge: 2016-07-03 | Disposition: A | Discharge status: Home / Self Care | Payer: Medicare Other | Source: Ambulatory Visit | Attending: Family Medicine | Admitting: Family Medicine

## 2016-07-03 ENCOUNTER — Other Ambulatory Visit: Payer: Self-pay | Admitting: Family Medicine

## 2016-07-03 DIAGNOSIS — M25552 Pain in left hip: Secondary | ICD-10-CM

## 2016-07-06 ENCOUNTER — Other Ambulatory Visit: Payer: Self-pay | Admitting: Family Medicine

## 2016-07-06 ENCOUNTER — Ambulatory Visit
Admission: RE | Admit: 2016-07-06 | Discharge: 2016-07-06 | Disposition: A | Discharge status: Home / Self Care | Payer: Medicare Other | Source: Ambulatory Visit | Attending: Family Medicine | Admitting: Family Medicine

## 2016-07-06 DIAGNOSIS — R52 Pain, unspecified: Secondary | ICD-10-CM

## 2016-07-16 ENCOUNTER — Inpatient Hospital Stay (HOSPITAL_COMMUNITY)
Admission: EM | Admit: 2016-07-16 | Discharge: 2016-07-18 | DRG: 683 | Disposition: A | Discharge status: Home Health | Payer: Medicare Other | Attending: Internal Medicine | Admitting: Internal Medicine

## 2016-07-16 ENCOUNTER — Encounter (HOSPITAL_COMMUNITY): Payer: Self-pay

## 2016-07-16 ENCOUNTER — Observation Stay (HOSPITAL_COMMUNITY): Payer: Medicare Other

## 2016-07-16 DIAGNOSIS — N184 Chronic kidney disease, stage 4 (severe): Secondary | ICD-10-CM | POA: Diagnosis present

## 2016-07-16 DIAGNOSIS — S72001A Fracture of unspecified part of neck of right femur, initial encounter for closed fracture: Secondary | ICD-10-CM | POA: Diagnosis present

## 2016-07-16 DIAGNOSIS — Z7901 Long term (current) use of anticoagulants: Secondary | ICD-10-CM

## 2016-07-16 DIAGNOSIS — N1831 Chronic kidney disease, stage 3a: Secondary | ICD-10-CM | POA: Diagnosis present

## 2016-07-16 DIAGNOSIS — N179 Acute kidney failure, unspecified: Secondary | ICD-10-CM | POA: Diagnosis not present

## 2016-07-16 DIAGNOSIS — E039 Hypothyroidism, unspecified: Secondary | ICD-10-CM | POA: Diagnosis present

## 2016-07-16 DIAGNOSIS — J441 Chronic obstructive pulmonary disease with (acute) exacerbation: Secondary | ICD-10-CM | POA: Diagnosis present

## 2016-07-16 DIAGNOSIS — K219 Gastro-esophageal reflux disease without esophagitis: Secondary | ICD-10-CM

## 2016-07-16 DIAGNOSIS — I493 Ventricular premature depolarization: Secondary | ICD-10-CM | POA: Diagnosis present

## 2016-07-16 DIAGNOSIS — J449 Chronic obstructive pulmonary disease, unspecified: Secondary | ICD-10-CM | POA: Diagnosis present

## 2016-07-16 DIAGNOSIS — Z87891 Personal history of nicotine dependence: Secondary | ICD-10-CM

## 2016-07-16 DIAGNOSIS — Z9081 Acquired absence of spleen: Secondary | ICD-10-CM

## 2016-07-16 DIAGNOSIS — K5641 Fecal impaction: Secondary | ICD-10-CM | POA: Diagnosis not present

## 2016-07-16 DIAGNOSIS — Z96643 Presence of artificial hip joint, bilateral: Secondary | ICD-10-CM | POA: Diagnosis present

## 2016-07-16 DIAGNOSIS — H02105 Unspecified ectropion of left lower eyelid: Secondary | ICD-10-CM | POA: Diagnosis present

## 2016-07-16 DIAGNOSIS — R001 Bradycardia, unspecified: Secondary | ICD-10-CM | POA: Diagnosis present

## 2016-07-16 DIAGNOSIS — Z8249 Family history of ischemic heart disease and other diseases of the circulatory system: Secondary | ICD-10-CM

## 2016-07-16 DIAGNOSIS — Z9049 Acquired absence of other specified parts of digestive tract: Secondary | ICD-10-CM

## 2016-07-16 DIAGNOSIS — I4891 Unspecified atrial fibrillation: Secondary | ICD-10-CM | POA: Diagnosis present

## 2016-07-16 DIAGNOSIS — E860 Dehydration: Secondary | ICD-10-CM | POA: Diagnosis present

## 2016-07-16 DIAGNOSIS — N189 Chronic kidney disease, unspecified: Secondary | ICD-10-CM

## 2016-07-16 DIAGNOSIS — R140 Abdominal distension (gaseous): Secondary | ICD-10-CM

## 2016-07-16 DIAGNOSIS — M545 Low back pain, unspecified: Secondary | ICD-10-CM

## 2016-07-16 DIAGNOSIS — I5032 Chronic diastolic (congestive) heart failure: Secondary | ICD-10-CM | POA: Diagnosis present

## 2016-07-16 DIAGNOSIS — I130 Hypertensive heart and chronic kidney disease with heart failure and stage 1 through stage 4 chronic kidney disease, or unspecified chronic kidney disease: Secondary | ICD-10-CM | POA: Diagnosis present

## 2016-07-16 DIAGNOSIS — R791 Abnormal coagulation profile: Secondary | ICD-10-CM | POA: Diagnosis present

## 2016-07-16 DIAGNOSIS — I480 Paroxysmal atrial fibrillation: Secondary | ICD-10-CM | POA: Diagnosis present

## 2016-07-16 DIAGNOSIS — N183 Chronic kidney disease, stage 3 (moderate): Secondary | ICD-10-CM

## 2016-07-16 DIAGNOSIS — G8929 Other chronic pain: Secondary | ICD-10-CM | POA: Diagnosis present

## 2016-07-16 DIAGNOSIS — S32020A Wedge compression fracture of second lumbar vertebra, initial encounter for closed fracture: Secondary | ICD-10-CM | POA: Diagnosis present

## 2016-07-16 LAB — I-STAT CHEM 8, ED
BUN: 62 mg/dL — ABNORMAL HIGH (ref 6–20)
CALCIUM ION: 1.07 mmol/L — AB (ref 1.15–1.40)
Chloride: 105 mmol/L (ref 101–111)
Creatinine, Ser: 2.3 mg/dL — ABNORMAL HIGH (ref 0.61–1.24)
Glucose, Bld: 117 mg/dL — ABNORMAL HIGH (ref 65–99)
HCT: 35 % — ABNORMAL LOW (ref 39.0–52.0)
HEMOGLOBIN: 11.9 g/dL — AB (ref 13.0–17.0)
Potassium: 3.8 mmol/L (ref 3.5–5.1)
Sodium: 142 mmol/L (ref 135–145)
TCO2: 26 mmol/L (ref 0–100)

## 2016-07-16 LAB — URINALYSIS, ROUTINE W REFLEX MICROSCOPIC
BILIRUBIN URINE: NEGATIVE
GLUCOSE, UA: NEGATIVE mg/dL
HGB URINE DIPSTICK: NEGATIVE
KETONES UR: NEGATIVE mg/dL
LEUKOCYTES UA: NEGATIVE
Nitrite: NEGATIVE
PH: 6 (ref 5.0–8.0)
PROTEIN: NEGATIVE mg/dL
Specific Gravity, Urine: 1.015 (ref 1.005–1.030)

## 2016-07-16 LAB — PROTIME-INR
INR: 3.67
Prothrombin Time: 37.3 seconds — ABNORMAL HIGH (ref 11.4–15.2)

## 2016-07-16 LAB — CBC WITH DIFFERENTIAL/PLATELET
Basophils Absolute: 0 10*3/uL (ref 0.0–0.1)
Basophils Relative: 0 %
EOS PCT: 2 %
Eosinophils Absolute: 0.2 10*3/uL (ref 0.0–0.7)
HCT: 38.2 % — ABNORMAL LOW (ref 39.0–52.0)
HEMOGLOBIN: 12.7 g/dL — AB (ref 13.0–17.0)
LYMPHS ABS: 2.9 10*3/uL (ref 0.7–4.0)
LYMPHS PCT: 28 %
MCH: 31.8 pg (ref 26.0–34.0)
MCHC: 33.2 g/dL (ref 30.0–36.0)
MCV: 95.7 fL (ref 78.0–100.0)
MONOS PCT: 9 %
Monocytes Absolute: 0.9 10*3/uL (ref 0.1–1.0)
NEUTROS PCT: 61 %
Neutro Abs: 6.5 10*3/uL (ref 1.7–7.7)
Platelets: 320 10*3/uL (ref 150–400)
RBC: 3.99 MIL/uL — AB (ref 4.22–5.81)
RDW: 12.9 % (ref 11.5–15.5)
WBC: 10.6 10*3/uL — AB (ref 4.0–10.5)

## 2016-07-16 LAB — COMPREHENSIVE METABOLIC PANEL
ALK PHOS: 191 U/L — AB (ref 38–126)
ALT: 13 U/L — AB (ref 17–63)
ANION GAP: 9 (ref 5–15)
AST: 18 U/L (ref 15–41)
Albumin: 3.3 g/dL — ABNORMAL LOW (ref 3.5–5.0)
BILIRUBIN TOTAL: 0.8 mg/dL (ref 0.3–1.2)
BUN: 54 mg/dL — ABNORMAL HIGH (ref 6–20)
CALCIUM: 8.5 mg/dL — AB (ref 8.9–10.3)
CO2: 24 mmol/L (ref 22–32)
CREATININE: 2.27 mg/dL — AB (ref 0.61–1.24)
Chloride: 107 mmol/L (ref 101–111)
GFR calc non Af Amer: 23 mL/min — ABNORMAL LOW (ref >60)
GFR, EST AFRICAN AMERICAN: 26 mL/min — AB (ref >60)
Glucose, Bld: 120 mg/dL — ABNORMAL HIGH (ref 65–99)
Potassium: 3.8 mmol/L (ref 3.5–5.1)
SODIUM: 140 mmol/L (ref 135–145)
TOTAL PROTEIN: 6.7 g/dL (ref 6.5–8.1)

## 2016-07-16 LAB — POC OCCULT BLOOD, ED: Fecal Occult Bld: NEGATIVE

## 2016-07-16 LAB — TYPE AND SCREEN
ABO/RH(D): B NEG
Antibody Screen: NEGATIVE

## 2016-07-16 MED ORDER — LEVOTHYROXINE SODIUM 50 MCG PO TABS
50.0000 ug | ORAL_TABLET | Freq: Every day | ORAL | Status: DC
  Administered 2016-07-17 – 2016-07-18 (×2): 50 ug via ORAL
  Filled 2016-07-16 (×2): qty 1

## 2016-07-16 MED ORDER — SODIUM CHLORIDE 0.9 % IV BOLUS (SEPSIS)
500.0000 mL | Freq: Once | INTRAVENOUS | Status: AC
  Administered 2016-07-16: 500 mL via INTRAVENOUS

## 2016-07-16 MED ORDER — LATANOPROST 0.005 % OP SOLN
1.0000 [drp] | Freq: Every day | OPHTHALMIC | Status: DC
  Administered 2016-07-16 – 2016-07-17 (×2): 1 [drp] via OPHTHALMIC
  Filled 2016-07-16: qty 2.5

## 2016-07-16 MED ORDER — POLYETHYLENE GLYCOL 3350 17 G PO PACK: 17.0000 g | PACK | Freq: Every day | ORAL | Status: DC

## 2016-07-16 MED ORDER — ALBUTEROL SULFATE (2.5 MG/3ML) 0.083% IN NEBU: 2.5000 mg | INHALATION_SOLUTION | Freq: Four times a day (QID) | RESPIRATORY_TRACT | Status: DC | PRN

## 2016-07-16 MED ORDER — BISACODYL 10 MG RE SUPP: 10.0000 mg | Freq: Every day | RECTAL | Status: DC | PRN

## 2016-07-16 MED ORDER — LIDOCAINE HCL 2 % EX GEL
1.0000 "application " | Freq: Once | CUTANEOUS | Status: AC
  Administered 2016-07-16: 1 via TOPICAL
  Filled 2016-07-16: qty 20

## 2016-07-16 MED ORDER — SENNOSIDES-DOCUSATE SODIUM 8.6-50 MG PO TABS
1.0000 | ORAL_TABLET | Freq: Every evening | ORAL | Status: DC | PRN
  Filled 2016-07-16: qty 1

## 2016-07-16 MED ORDER — ACETAMINOPHEN 325 MG PO TABS: 650.0000 mg | ORAL_TABLET | Freq: Four times a day (QID) | ORAL | Status: DC | PRN

## 2016-07-16 MED ORDER — ACETAMINOPHEN 500 MG PO TABS
1000.0000 mg | ORAL_TABLET | Freq: Three times a day (TID) | ORAL | Status: DC
  Administered 2016-07-16 – 2016-07-18 (×4): 1000 mg via ORAL
  Filled 2016-07-16 (×4): qty 2

## 2016-07-16 MED ORDER — SODIUM CHLORIDE 0.9 % IV SOLN
INTRAVENOUS | Status: DC
  Administered 2016-07-16: 15:00:00 via INTRAVENOUS

## 2016-07-16 MED ORDER — IOPAMIDOL (ISOVUE-300) INJECTION 61%
INTRAVENOUS | Status: AC
  Filled 2016-07-16: qty 30

## 2016-07-16 MED ORDER — ACETAMINOPHEN 650 MG RE SUPP: 650.0000 mg | Freq: Four times a day (QID) | RECTAL | Status: DC | PRN

## 2016-07-16 MED ORDER — ONDANSETRON HCL 4 MG PO TABS: 4.0000 mg | ORAL_TABLET | Freq: Four times a day (QID) | ORAL | Status: DC | PRN

## 2016-07-16 MED ORDER — ONDANSETRON HCL 4 MG/2ML IJ SOLN: 4.0000 mg | Freq: Four times a day (QID) | INTRAMUSCULAR | Status: DC | PRN

## 2016-07-16 MED ORDER — PANTOPRAZOLE SODIUM 40 MG PO TBEC
40.0000 mg | DELAYED_RELEASE_TABLET | Freq: Every day | ORAL | Status: DC
  Administered 2016-07-17 – 2016-07-18 (×2): 40 mg via ORAL
  Filled 2016-07-16 (×2): qty 1

## 2016-07-16 MED ORDER — SORBITOL 70 % SOLN: 960.0000 mL | TOPICAL_OIL | Freq: Once | ORAL | Status: DC

## 2016-07-16 NOTE — ED Provider Notes (Signed)
[facility]-EMERGENCY DEPT Provider Note   CSN: [id number] Arrival date & time: 07/16/16  7829     History   Chief Complaint No chief complaint on file.   HPI Jordan [name] is a 83 y.o. male brought in by his son for constipation. The patient is on Coumadin for paroxysmal atrial fibrillation. On the floor. 18 2018. The patient had a fall. His son states that since that time he has had problems complaining of low back pain and hip pain. He has had constipation and has had only one bowel movement which was one week ago. When he made a bowel movement. He noted that it was covered with dark red blood. The patient was seen by his PCP and had both a hip and pelvis x-ray which were negative. He has been able to ambulate with assistance as normal at home. Today he began complaining of distention and abdominal pain and urgency to defecate. He denies any unexplained weight loss, fevers, nausea, vomiting or urinary symptoms.   HPI  Past Medical History:  Diagnosis Date  . Acute blood loss anemia   . CHF (congestive heart failure) (HCC)   . Chronic diastolic heart failure (HCC)   . CKD (chronic kidney disease) stage 3, GFR 30-59 ml/min   . Constipation   . COPD (chronic obstructive pulmonary disease) (HCC)   . Dyspnea   . Ectropion   . Family history of adverse reaction to anesthesia    " daughter has difficulty waking "  . GERD (gastroesophageal reflux disease)   . Hypertension   . Intertrochanteric fracture of left hip (HCC)   . Leukocytosis   . Paroxysmal atrial fibrillation (HCC)   . Physical deconditioning   . PVC (premature ventricular contraction)   . Thyroid disease   . Ventricular bigeminy     Patient Active Problem List   Diagnosis Date Noted  . Femur fracture, left (HCC) 05/07/2015  . Hip fracture (HCC) 05/06/2015  . Faintness 09/16/2013  . 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 Syncope 07/10/2013  . Pneumonia 05/23/2013  . Dysphagia, unspecified(787.20) 05/09/2013  . Acute blood  loss anemia 05/08/2013  . Food impaction of esophagus 04/26/2013  . Closed right hip fracture (HCC) 04/24/2013  . Chronic kidney disease (CKD) stage G3a/A1 04/24/2013  . CAP (community acquired pneumonia) 04/24/2013  . Leukocytosis 04/24/2013  . Encounter for therapeutic drug monitoring 04/18/2013  . COPD (chronic obstructive pulmonary disease) (HCC) 01/01/2013  . Chronic anticoagulation 01/01/2013  . Chronic diastolic heart failure (HCC) 01/01/2013  . Diarrhea 01/01/2013  . Atrial fibrillation (HCC) 12/18/2012  . Bradycardia 12/16/2012  . Hypertension 12/16/2012  . Hypothyroidism 12/16/2012  . Acute kidney failure (HCC) 12/16/2012    Past Surgical History:  Procedure Laterality Date  . CHOLECYSTECTOMY    . ESOPHAGOGASTRODUODENOSCOPY N/A 04/26/2013   Procedure: ESOPHAGOGASTRODUODENOSCOPY (EGD);  Surgeon: Florencia Reasons, MD;  Location: [facility] ENDOSCOPY;  Service: Endoscopy;  Laterality: N/A;  . ESOPHAGOGASTRODUODENOSCOPY (EGD) WITH ESOPHAGEAL DILATION N/A 04/26/2013   Procedure: ESOPHAGOGASTRODUODENOSCOPY (EGD) WITH ESOPHAGEAL DILATION;  Surgeon: Florencia Reasons, MD;  Location: [facility] ENDOSCOPY;  Service: Endoscopy;  Laterality: N/A;  . HIP ARTHROPLASTY Right 04/25/2013   Procedure: ARTHROPLASTY BIPOLAR HIP;  Surgeon: Nestor Lewandowsky, MD;  Location: [facility] OR;  Service: Orthopedics;  Laterality: Right;  . HIP ARTHROPLASTY Left 05/07/2015   Procedure: LEFT HEMI-HIP ARTHROPLASTY CEMENTED;  Surgeon: Gean Birchwood, MD;  Location: [facility] OR;  Service: Orthopedics;  Laterality: Left;  . SPLENECTOMY, PARTIAL  Home Medications    Prior to Admission medications   Medication Sig Start Date End Date Taking? Authorizing Provider  acetaminophen (TYLENOL) 500 MG tablet Take 1,000 mg by mouth 3 (three) times daily.     [provider]  albuterol (PROVENTIL HFA;VENTOLIN HFA) 108 (90 BASE) MCG/ACT inhaler Inhale 2 puffs into the lungs every 6 (six) hours as needed for wheezing.    [provider]  albuterol (PROVENTIL) (2.5 MG/3ML) 0.083% nebulizer solution Take 3 mLs (2.5 mg total) by nebulization every 2 (two) hours as needed for wheezing or shortness of breath. 05/11/15   Ghimire, Werner Lean, MD  guaiFENesin (MUCINEX) 600 MG 12 hr tablet Take 1 tablet (600 mg total) by mouth 2 (two) times daily. 05/11/15   Ghimire, Werner Lean, MD  hydrochlorothiazide (HYDRODIURIL) 25 MG tablet TAKE 1/2 TABLET BY MOUTH DAILY 06/19/16   Jake Bathe, MD  ipratropium (ATROVENT) 0.02 % nebulizer solution Take 2.5 mLs (0.5 mg total) by nebulization 2 (two) times daily. 05/11/15   Ghimire, Werner Lean, MD  latanoprost (XALATAN) 0.005 % ophthalmic solution Place 1 drop into both eyes at bedtime. For glaucoma    [provider]  levalbuterol (XOPENEX) 0.63 MG/3ML nebulizer solution Take 3 mLs (0.63 mg total) by nebulization 2 (two) times daily. 05/11/15   Ghimire, Werner Lean, MD  levothyroxine (SYNTHROID, LEVOTHROID) 50 MCG tablet Take 50 mcg by mouth daily before breakfast.    [provider]  Oxytetracycline-Polymyxin B (TERRAMYCIN/POLYMYXIN B SULFATE OP) Apply 1 application to eye 2 (two) times daily. Left eye ectropion    [provider]  pantoprazole (PROTONIX) 40 MG tablet Take 1 tablet (40 mg total) by mouth daily. 05/11/15   Ghimire, Werner Lean, MD  polyethylene glycol (MIRALAX / Ethelene Hal) packet Take 17 g by mouth daily. 05/11/15   Ghimire, Werner Lean, MD  tizanidine (ZANAFLEX) 2 MG capsule Take 1 capsule (2 mg total) by mouth 3 (three) times daily. 05/07/15   Allena Katz, PA-C  warfarin (COUMADIN) 2.5 MG tablet Take as directed by coumadin clinic 06/19/16   Jake Bathe, MD    Family History Family History  Problem Relation Age of Onset  . Hypertension Father     Social History Social History  Substance Use Topics  . Smoking status: Former Games developer  . Smokeless tobacco: Never Used     Comment: quit smoking in 1990  . Alcohol use No     Allergies   Diltiazem  hcl er beads and Metoprolol succinate er [metoprolol tartrate]   Review of Systems Review of Systems Ten systems reviewed and are negative for acute change, except as noted in the HPI.   Physical Exam Updated Vital Signs There were no vitals taken for this visit.  Physical Exam  Constitutional: He appears well-developed and well-nourished. No distress.  Elderly male in no acute distress. Patient does not appear uncomfortable. He is unkempt. Urine stains noted to his pants. Multiple food stains and patient appears that he hasn't been.  HENT:  Head: Normocephalic and atraumatic.  Eyes: Conjunctivae are normal. No scleral icterus.  Ectropion of the left lower eyelid  Neck: Normal range of motion. Neck supple.  Cardiovascular: Normal rate, regular rhythm and normal heart sounds.   Pulmonary/Chest: Effort normal and breath sounds normal. No respiratory distress.  Abdominal: Soft. He exhibits no distension. There is no tenderness.  Genitourinary:  Genitourinary Comments: Digital Rectal Exam reveals sphincter with good tone. No external hemorrhoids. No masses or fissures. Stool color  is brown with no overt blood. Firm stool ball is noted in the rectum.  Musculoskeletal: He exhibits no edema.  Neurological: He is alert.  Skin: Skin is warm and dry. He is not diaphoretic.  Psychiatric: His behavior is normal.  Nursing note and vitals reviewed.    ED Treatments / Results  Labs (all labs ordered are listed, but only abnormal results are displayed) Labs Reviewed  COMPREHENSIVE METABOLIC PANEL  PROTIME-INR  CBC WITH DIFFERENTIAL/PLATELET  I-STAT CHEM 8, ED  POC OCCULT BLOOD, ED  TYPE AND SCREEN    EKG  EKG Interpretation None       Radiology No results found.  Procedures Fecal disimpaction Date/Time: 07/17/2016 3:38 PM Performed by: Arthor CaptainHARRIS, Normal Recinos Authorized by: Arthor CaptainHARRIS, Cire Clute  Consent given by: patient Patient identity confirmed: verbally with patient Preparation:  Patient was prepped and draped in the usual sterile fashion. Local anesthesia used: yes  Anesthesia: Local anesthesia used: yes Local Anesthetic: topical anesthetic  Sedation: Patient sedated: no Patient tolerance: Patient tolerated the procedure well with no immediate complications    (including critical care time)  Medications Ordered in ED Medications - No data to display   Initial Impression / Assessment and Plan / ED Course  I have reviewed the triage vital signs and the nursing notes.  Pertinent labs & imaging results that were available during my care of the patient were reviewed by me and considered in my medical decision making (see chart for details).  Clinical Course as of Jul 17 1339  Sun Jul 16, 2016  1001 Patient's occult stool test is negative for blood. He does appear to have a fecal impaction. Nursing nurse tech note that during changing the patient is underwear seemed to swell. I suspect he has been leaking liquid stool around the impaction.  [AH]    Clinical Course User Index [AH] Arthor CaptainHarris, Daneisha Surges, PA-C    Patient with Fecal impaction and acute kidney injury. He has a CT abdomen pending. Patient will be admitted to the hospitalist. I doubt small bowel obstruction. Given his benign abdominal exam  Final Clinical Impressions(s) / ED Diagnoses   Final diagnoses:  AKI (acute kidney injury) (HCC)  Supratherapeutic INR  Abdominal distention  Fecal impaction [facility](HCC)    New Prescriptions New Prescriptions   No medications on file     Arthor CaptainHarris, Gilmore List, PA-C 07/17/16 1540    Derwood KaplanNanavati, Ankit, MD 07/18/16 2243

## 2016-07-16 NOTE — ED Notes (Signed)
300 mls of soap sud enema tolerated. Patient attempting to have BM now.

## 2016-07-16 NOTE — ED Triage Notes (Signed)
Patient comes in from home with c/o constipation for the last 3-4 days. Patient's pants are noticeably soiled. It appears he has had some bowel incontinence. Patient is a/ox4. Back pain from fall in March. Hx of GI bleed. On coumadin.

## 2016-07-16 NOTE — H&P (Signed)
History and Physical    Jordan [name]:[id number] DOB: [birth year] DOA: 07/16/2016   PCP: Kaleen Mask, MD   Patient coming from:  Home    Chief Complaint: Abdominal pain   HPI: Jordan [name] is a 83 y.o. male with medical history significant for PAF on Coumadin, hypothyroidism, DCHF, CKD, chronic back pain, s/p R hip arthroplasty 04/2015,  chronic constipation since a fall in 06/2016,  presenting to the ED with 1week history of  Impaction. He denies any changes in his diet, but does admit hesitance to have a bowel movement due to his pain. In addition, he has had decreased mobility due to back pain issues. He denies using narcotic pain meds. No other fall events. Denies decreased sensation in the rectal area. Denies GIB, abut did notice a few episodes of blood in the toilet paper prior to this event, possibly due to straining. CT A/P and LS spine pending . Patient has not been using laxatives on a regular basis. Had successfull disimpaction at the ED. Denies fevers, chills, night sweats, vision changes, or mucositis. Denies any respiratory complaints. Denies any chest pain or palpitations. Denies lower extremity swelling. Denies nausea, heartburn  . Denies any dysuria. Denies abnormal skin rashes, or neuropathy. NO confusion is noted enies  epistaxis, hematemesis, hematuria .    ED Course:  BP (!) 132/55   Pulse (!) 27   Temp 97.4 F (36.3 C) (Oral)   Resp 16   Ht 5\' 8"  (1.727 m)   Wt 86.2 kg (190 lb)   SpO2 92%   BMI 28.89 kg/m   Successful disimpaction sodium 142 potassium 3.8 glucose 117 BUN 62 creatinine 2.3  calcium 8.5  alkaline phosphatase 191  albumin 3.3  white count 10.6  hemoglobin 11.9 Hemoccult negative  PT 37.3 with INR 3.67 CT of the abdomen and pelvis and CT of the LS spine pending CXR pending   Review of Systems: As per HPI otherwise 10 point review of systems negative.   Past Medical History:  Diagnosis Date  . Acute blood loss anemia   . CHF  (congestive heart failure) (HCC)   . Chronic diastolic heart failure (HCC)   . CKD (chronic kidney disease) stage 3, GFR 30-59 ml/min   . Constipation   . COPD (chronic obstructive pulmonary disease) (HCC)   . Dyspnea   . Ectropion   . Family history of adverse reaction to anesthesia    " daughter has difficulty waking "  . GERD (gastroesophageal reflux disease)   . Hypertension   . Intertrochanteric fracture of left hip (HCC)   . Leukocytosis   . Paroxysmal atrial fibrillation (HCC)   . Physical deconditioning   . PVC (premature ventricular contraction)   . Thyroid disease   . Ventricular bigeminy     Past Surgical History:  Procedure Laterality Date  . CHOLECYSTECTOMY    . ESOPHAGOGASTRODUODENOSCOPY N/A 04/26/2013   Procedure: ESOPHAGOGASTRODUODENOSCOPY (EGD);  Surgeon: Florencia Reasons, MD;  Location: [facility] ENDOSCOPY;  Service: Endoscopy;  Laterality: N/A;  . ESOPHAGOGASTRODUODENOSCOPY (EGD) WITH ESOPHAGEAL DILATION N/A 04/26/2013   Procedure: ESOPHAGOGASTRODUODENOSCOPY (EGD) WITH ESOPHAGEAL DILATION;  Surgeon: Florencia Reasons, MD;  Location: [facility] ENDOSCOPY;  Service: Endoscopy;  Laterality: N/A;  . HIP ARTHROPLASTY Right 04/25/2013   Procedure: ARTHROPLASTY BIPOLAR HIP;  Surgeon: Nestor Lewandowsky, MD;  Location: [facility] OR;  Service: Orthopedics;  Laterality: Right;  . HIP ARTHROPLASTY Left 05/07/2015   Procedure: LEFT HEMI-HIP ARTHROPLASTY CEMENTED;  Surgeon: Gean Birchwood, MD;  Location: [facility] OR;  Service: Orthopedics;  Laterality: Left;  . SPLENECTOMY, PARTIAL      Social History Social History   Social History  . Marital status: Widowed    Spouse name: N/A  . Number of children: N/A  . Years of education: N/A   Occupational History  . Not on file.   Social History Main Topics  . Smoking status: Former Games developermoker  . Smokeless tobacco: Never Used     Comment: quit smoking in 1990  . Alcohol use No  . Drug use: No  . Sexual activity: Not on file   Other Topics Concern  . Not on  file   Social History Narrative  . No narrative on file     Allergies  Allergen Reactions  . Diltiazem Hcl Er Beads Other (See Comments)    Tachycardia  . Metoprolol Succinate Er [Metoprolol Tartrate] Other (See Comments)    hallucinations    Family History  Problem Relation Age of Onset  . Hypertension Father       Prior to Admission medications   Medication Sig Start Date End Date Taking? Authorizing Provider  acetaminophen (TYLENOL) 500 MG tablet Take 1,000 mg by mouth 3 (three) times daily.    Yes [provider]  albuterol (PROVENTIL HFA;VENTOLIN HFA) 108 (90 Base) MCG/ACT inhaler Inhale 2 puffs into the lungs every 6 (six) hours as needed for wheezing or shortness of breath.   Yes [provider]  bisacodyl (DULCOLAX) 5 MG EC tablet Take 5 mg by mouth daily as needed for moderate constipation.   Yes [provider]  hydrochlorothiazide (HYDRODIURIL) 25 MG tablet TAKE 1/2 TABLET BY MOUTH DAILY 06/19/16  Yes Jake BatheSkains, Mark C, MD  latanoprost (XALATAN) 0.005 % ophthalmic solution Place 1 drop into both eyes at bedtime. For glaucoma   Yes [provider]  levothyroxine (SYNTHROID, LEVOTHROID) 50 MCG tablet Take 50 mcg by mouth daily before breakfast.   Yes [provider]  pantoprazole (PROTONIX) 40 MG tablet Take 1 tablet (40 mg total) by mouth daily. 05/11/15  Yes Ghimire, Werner LeanShanker M, MD  warfarin (COUMADIN) 2.5 MG tablet Take as directed by coumadin clinic Patient taking differently: Take 2.5-3.75 mg by mouth daily. Take 2.5 mg on Sun / Tues / Thurs / Sat  Take 3.75 mg on  Mon / Wed / Fri 06/19/16  Yes Jake BatheSkains, Mark C, MD    Physical Exam:  Vitals:   07/16/16 1045 07/16/16 1100 07/16/16 1115 07/16/16 1130  BP: 132/77 (!) 132/116 (!) 140/107 (!) 132/55  Pulse: 65 69 68 (!) 27  Resp: 14 20 16 16   Temp:      TempSrc:      SpO2: 96% 91% 98% 92%  Weight:      Height:       Constitutional: NAD, calm, comfortable  Eyes: PERRL, lids  and conjunctivae normal ENMT: Mucous membranes are moist, without exudate or lesions  Neck: normal, supple, no masses, no thyromegaly Respiratory: clear to auscultation bilaterally, no wheezing, no crackles. Normal respiratory effort  Cardiovascular: Regular rate and rhythm, 1/6 high pitched systolic murmurs, rubs or gallops. Trace  extremity edema. 2+ pedal pulses. No carotid bruits.  Abdomen: Soft, non tender, No hepatosplenomegaly. Bowel sounds positive.  Musculoskeletal: no clubbing / cyanosis. Moves all extremities Skin: no jaundice, No lesions.  Neurologic: Sensation intact  Strength equal in all extremities Psychiatric:   Alert and oriented x 3. Normal mood.     Labs on Admission: I have personally  reviewed following labs and imaging studies  CBC:  Recent Labs Lab 07/16/16 0955 07/16/16 1011  WBC 10.6*  --   NEUTROABS 6.5  --   HGB 12.7* 11.9*  HCT 38.2* 35.0*  MCV 95.7  --   PLT 320  --     Basic Metabolic Panel:  Recent Labs Lab 07/16/16 0955 07/16/16 1011  NA 140 142  K 3.8 3.8  CL 107 105  CO2 24  --   GLUCOSE 120* 117*  BUN 54* 62*  CREATININE 2.27* 2.30*  CALCIUM 8.5*  --     GFR: Estimated Creatinine Clearance: 20.1 mL/min (A) (by C-G formula based on SCr of 2.3 mg/dL (H)).  Liver Function Tests:  Recent Labs Lab 07/16/16 0955  AST 18  ALT 13*  ALKPHOS 191*  BILITOT 0.8  PROT 6.7  ALBUMIN 3.3*   No results for input(s): LIPASE, AMYLASE in the last 168 hours. No results for input(s): AMMONIA in the last 168 hours.  Coagulation Profile:  Recent Labs Lab 07/16/16 0955  INR 3.67    Cardiac Enzymes: No results for input(s): CKTOTAL, CKMB, CKMBINDEX, TROPONINI in the last 168 hours.  BNP (last 3 results) No results for input(s): PROBNP in the last 8760 hours.  HbA1C: No results for input(s): HGBA1C in the last 72 hours.  CBG: No results for input(s): GLUCAP in the last 168 hours.  Lipid Profile: No results for input(s): CHOL,  HDL, LDLCALC, TRIG, CHOLHDL, LDLDIRECT in the last 72 hours.  Thyroid Function Tests: No results for input(s): TSH, T4TOTAL, FREET4, T3FREE, THYROIDAB in the last 72 hours.  Anemia Panel: No results for input(s): VITAMINB12, FOLATE, FERRITIN, TIBC, IRON, RETICCTPCT in the last 72 hours.  Urine analysis:    Component Value Date/Time   COLORURINE YELLOW 05/09/2015 1524   APPEARANCEUR CLEAR 05/09/2015 1524   LABSPEC 1.017 05/09/2015 1524   PHURINE 5.0 05/09/2015 1524   GLUCOSEU NEGATIVE 05/09/2015 1524   HGBUR TRACE (A) 05/09/2015 1524   BILIRUBINUR NEGATIVE 05/09/2015 1524   KETONESUR NEGATIVE 05/09/2015 1524   PROTEINUR NEGATIVE 05/09/2015 1524   UROBILINOGEN 0.2 07/10/2013 1520   NITRITE NEGATIVE 05/09/2015 1524   LEUKOCYTESUR NEGATIVE 05/09/2015 1524    Sepsis Labs: @LABRCNTIP (procalcitonin:4,lacticidven:4) )No results found for this or any previous visit (from the past 240 hour(s)).   Radiological Exams on Admission: No results found.  EKG: Independently reviewed.  Assessment/Plan Active Problems:   Fecal impaction (HCC)   Bradycardia   Hypothyroidism   Atrial fibrillation (HCC)   COPD (chronic obstructive pulmonary disease) (HCC)   Chronic anticoagulation   Chronic diastolic heart failure (HCC)   Closed right hip fracture (HCC)   Chronic kidney disease (CKD) stage G3a/A1   Frequent PVCs   AKI (acute kidney injury) (HCC)   GERD (gastroesophageal reflux disease)   Abdominal  pain in the setting of impaction, chronic constipation, worse since his recent fall in 06/2016  with subsequent decreased mobility,  rule out fracture versus obstruction, and a history of hypothyroidism. CT A/P and LS spine pending . Patient has not been using laxatives on a regular basis. s/p disimpaction at the ED. Hb 11.9, Hcult negative. WBC 10.6 Admit to med surg obs IVF  Pain control with tylenol for now, in view of constipation Daily laxative regimen with Miralax   Chronic back pain,  lower, s/p fall in 06/2016 , patient with history of R hip arthroplasty in 2017  . No neurological abnormalities. Ambulates with walker.  Await CT spine results r/o fracture  PT/OT   Acute on Chronic kidney disease stage 4,  likely due to dehydration vs.diuretics, vs UTI. Not confused.  BL Cr , currently 2.3/  Lab Results  Component Value Date   CREATININE 2.30 (H) 07/16/2016   CREATININE 2.27 (H) 07/16/2016   CREATININE 1.68 (H) 05/10/2015  IVF Hold diuretics  Repeat CMET in am Urinalysis  Hypothyroidism: Continue home Synthroid  Atrial Fibrillation , CHADSVASC 4 on anticoagulation with Coumadin, INR 3.67 Hold Coumadin and then to manage per Pharmacy   Diastolic CHF: No acute decompensation weight 190 lb  Hold diuretic today due to AKI Obtain daily weights Monitor intake and output   Hypertension BP (!) 149/78   Pulse 69   Controlled Continue home meds in am Add Hydralazine Q6 hours as needed for BP 160/90    COPD without exacerbation Osats normal in RA  CXR pending WBC 10.6  Continue inhalers and O2 prn    GERD, no acute symptoms Continue PPI   DVT prophylaxis: Coumadin tomorrow, as pt INR supratherapeutic, SCDs Code Status:   Full      Family Communication:  Discussed with patient and daughter Disposition Plan: Expect patient to be discharged to home after condition improves Consults called:   None  Admission status: Medsurg   Schylar Wuebker E, PA-C [facility]   07/16/2016, 1:21 PM

## 2016-07-16 NOTE — Progress Notes (Signed)
ANTICOAGULATION CONSULT NOTE - Initial Consult  Pharmacy Consult for warfarin Indication: atrial fibrillation  Allergies  Allergen Reactions  . Diltiazem Hcl Er Beads Other (See Comments)    Tachycardia  . Metoprolol Succinate Er [Metoprolol Tartrate] Other (See Comments)    hallucinations    Patient Measurements: Height: 5\' 8"  (172.7 cm) Weight: 190 lb (86.2 kg) IBW/kg (Calculated) : 68.4  Vital Signs: Temp: 97.7 F (36.5 C) (05/06 1403) Temp Source: Oral (05/06 1403) BP: 159/67 (05/06 1403) Pulse Rate: 69 (05/06 1403)  Labs:  Recent Labs  07/16/16 0955 07/16/16 1011  HGB 12.7* 11.9*  HCT 38.2* 35.0*  PLT 320  --   LABPROT 37.3*  --   INR 3.67  --   CREATININE 2.27* 2.30*    Estimated Creatinine Clearance: 20.1 mL/min (A) (by C-G formula based on SCr of 2.3 mg/dL (H)).   Medical History: Past Medical History:  Diagnosis Date  . Acute blood loss anemia   . CHF (congestive heart failure) (HCC)   . Chronic diastolic heart failure (HCC)   . CKD (chronic kidney disease) stage 3, GFR 30-59 ml/min   . Constipation   . COPD (chronic obstructive pulmonary disease) (HCC)   . Dyspnea   . Ectropion   . Family history of adverse reaction to anesthesia    " daughter has difficulty waking "  . GERD (gastroesophageal reflux disease)   . Hypertension   . Intertrochanteric fracture of left hip (HCC)   . Leukocytosis   . Paroxysmal atrial fibrillation (HCC)   . Physical deconditioning   . PVC (premature ventricular contraction)   . Thyroid disease   . Ventricular bigeminy     Assessment: 83 yo male with h/o Afib (CHASDVASc = 4), on warfarin PTA. Pt's INR was supratherapeutic at 3.67. Pharmacy consulted to hold warfarin 5/6 and resume management 5/7.   Hgb 11.9, PLTC 320, no bleeding noted. Diet ordered, ate 0% of meal. No new DDI noted.   PTA dose: 3.75 mg MWF, 2.5 mg all other days.  Goal of Therapy:  INR 2-3 Monitor platelets by anticoagulation protocol:  Yes   Plan:  Hold warfarin x1 tonight Daily INR, CBC Monitor for s/sx bleeding, DDI, PO intake, clinical picture  Allena Katzaroline E Sy Saintjean, Pharm.D. PGY1 Pharmacy Resident 5/6/20182:10 PM Pager [phone]

## 2016-07-16 NOTE — Progress Notes (Signed)
Date Time  New Admission Note:   Arrival Method: Via stretcher from ED with EMT  Mental Orientation: Alert and Oriented X4 and very HOH Telemetry: cardiac monitor placed 6e20 Assessment: Assisted skin assessment with Meridee ScoreAndy Brake Skin: Warm, dry and intact. Eccymosis to left hip IV: Clean, dry and intact L antecubital Pain: Denies Tubes: N/A Safety Measures: Safety Fall Prevention Plan has been given, discussed. Advised patient of high fall risk and use of bed alarm Admission: Completed [facility] Orientation: Patient has been orientated to the room, unit and staff.  Family: Daughters at bedside Personal Belongings: Patient wearing watch and wedding band.   Orders have been reviewed and implemented. Will continue to monitor the patient. Call light has been placed within reach and bed alarm has been activated.   Hortencia ConradiWendi Riata Ikeda, RN MSN [facility]  Phone number: [phone]

## 2016-07-17 DIAGNOSIS — E860 Dehydration: Secondary | ICD-10-CM | POA: Diagnosis present

## 2016-07-17 DIAGNOSIS — Z9081 Acquired absence of spleen: Secondary | ICD-10-CM | POA: Diagnosis not present

## 2016-07-17 DIAGNOSIS — M545 Low back pain, unspecified: Secondary | ICD-10-CM

## 2016-07-17 DIAGNOSIS — Z8249 Family history of ischemic heart disease and other diseases of the circulatory system: Secondary | ICD-10-CM | POA: Diagnosis not present

## 2016-07-17 DIAGNOSIS — J449 Chronic obstructive pulmonary disease, unspecified: Secondary | ICD-10-CM

## 2016-07-17 DIAGNOSIS — Z7901 Long term (current) use of anticoagulants: Secondary | ICD-10-CM

## 2016-07-17 DIAGNOSIS — K219 Gastro-esophageal reflux disease without esophagitis: Secondary | ICD-10-CM | POA: Diagnosis present

## 2016-07-17 DIAGNOSIS — Z87891 Personal history of nicotine dependence: Secondary | ICD-10-CM | POA: Diagnosis not present

## 2016-07-17 DIAGNOSIS — Z9049 Acquired absence of other specified parts of digestive tract: Secondary | ICD-10-CM | POA: Diagnosis not present

## 2016-07-17 DIAGNOSIS — R140 Abdominal distension (gaseous): Secondary | ICD-10-CM | POA: Diagnosis not present

## 2016-07-17 DIAGNOSIS — K5641 Fecal impaction: Secondary | ICD-10-CM | POA: Diagnosis present

## 2016-07-17 DIAGNOSIS — N179 Acute kidney failure, unspecified: Principal | ICD-10-CM

## 2016-07-17 DIAGNOSIS — I480 Paroxysmal atrial fibrillation: Secondary | ICD-10-CM | POA: Diagnosis present

## 2016-07-17 DIAGNOSIS — H02105 Unspecified ectropion of left lower eyelid: Secondary | ICD-10-CM | POA: Diagnosis present

## 2016-07-17 DIAGNOSIS — N184 Chronic kidney disease, stage 4 (severe): Secondary | ICD-10-CM | POA: Diagnosis present

## 2016-07-17 DIAGNOSIS — I5032 Chronic diastolic (congestive) heart failure: Secondary | ICD-10-CM | POA: Diagnosis present

## 2016-07-17 DIAGNOSIS — N183 Chronic kidney disease, stage 3 (moderate): Secondary | ICD-10-CM | POA: Diagnosis not present

## 2016-07-17 DIAGNOSIS — G8929 Other chronic pain: Secondary | ICD-10-CM | POA: Diagnosis present

## 2016-07-17 DIAGNOSIS — Z96643 Presence of artificial hip joint, bilateral: Secondary | ICD-10-CM | POA: Diagnosis present

## 2016-07-17 DIAGNOSIS — I130 Hypertensive heart and chronic kidney disease with heart failure and stage 1 through stage 4 chronic kidney disease, or unspecified chronic kidney disease: Secondary | ICD-10-CM | POA: Diagnosis present

## 2016-07-17 DIAGNOSIS — R791 Abnormal coagulation profile: Secondary | ICD-10-CM | POA: Diagnosis present

## 2016-07-17 DIAGNOSIS — S32020D Wedge compression fracture of second lumbar vertebra, subsequent encounter for fracture with routine healing: Secondary | ICD-10-CM | POA: Diagnosis not present

## 2016-07-17 DIAGNOSIS — I493 Ventricular premature depolarization: Secondary | ICD-10-CM | POA: Diagnosis present

## 2016-07-17 DIAGNOSIS — E039 Hypothyroidism, unspecified: Secondary | ICD-10-CM

## 2016-07-17 LAB — COMPREHENSIVE METABOLIC PANEL
ALT: 13 U/L — ABNORMAL LOW (ref 17–63)
ANION GAP: 10 (ref 5–15)
AST: 21 U/L (ref 15–41)
Albumin: 3.3 g/dL — ABNORMAL LOW (ref 3.5–5.0)
Alkaline Phosphatase: 191 U/L — ABNORMAL HIGH (ref 38–126)
BILIRUBIN TOTAL: 1.1 mg/dL (ref 0.3–1.2)
BUN: 43 mg/dL — ABNORMAL HIGH (ref 6–20)
CHLORIDE: 107 mmol/L (ref 101–111)
CO2: 23 mmol/L (ref 22–32)
Calcium: 8.2 mg/dL — ABNORMAL LOW (ref 8.9–10.3)
Creatinine, Ser: 1.81 mg/dL — ABNORMAL HIGH (ref 0.61–1.24)
GFR calc Af Amer: 35 mL/min — ABNORMAL LOW (ref >60)
GFR, EST NON AFRICAN AMERICAN: 30 mL/min — AB (ref >60)
Glucose, Bld: 89 mg/dL (ref 65–99)
POTASSIUM: 3.7 mmol/L (ref 3.5–5.1)
Sodium: 140 mmol/L (ref 135–145)
TOTAL PROTEIN: 6.4 g/dL — AB (ref 6.5–8.1)

## 2016-07-17 LAB — PROTIME-INR
INR: 3.18
Prothrombin Time: 33.3 seconds — ABNORMAL HIGH (ref 11.4–15.2)

## 2016-07-17 LAB — CBC
HEMATOCRIT: 37.9 % — AB (ref 39.0–52.0)
HEMOGLOBIN: 12.9 g/dL — AB (ref 13.0–17.0)
MCH: 32.5 pg (ref 26.0–34.0)
MCHC: 34 g/dL (ref 30.0–36.0)
MCV: 95.5 fL (ref 78.0–100.0)
Platelets: 341 10*3/uL (ref 150–400)
RBC: 3.97 MIL/uL — AB (ref 4.22–5.81)
RDW: 12.7 % (ref 11.5–15.5)
WBC: 10.5 10*3/uL (ref 4.0–10.5)

## 2016-07-17 MED ORDER — WARFARIN SODIUM 2.5 MG PO TABS
2.5000 mg | ORAL_TABLET | Freq: Once | ORAL | Status: AC
  Administered 2016-07-17: 2.5 mg via ORAL
  Filled 2016-07-17: qty 1

## 2016-07-17 MED ORDER — WARFARIN - PHARMACIST DOSING INPATIENT: Freq: Every day | Status: DC

## 2016-07-17 MED ORDER — SENNOSIDES-DOCUSATE SODIUM 8.6-50 MG PO TABS
1.0000 | ORAL_TABLET | Freq: Two times a day (BID) | ORAL | Status: DC
  Administered 2016-07-17 – 2016-07-18 (×2): 1 via ORAL
  Filled 2016-07-17 (×2): qty 1

## 2016-07-17 MED ORDER — POLYETHYLENE GLYCOL 3350 17 G PO PACK
17.0000 g | PACK | Freq: Two times a day (BID) | ORAL | Status: DC
  Administered 2016-07-17 – 2016-07-18 (×2): 17 g via ORAL
  Filled 2016-07-17 (×2): qty 1

## 2016-07-17 NOTE — Progress Notes (Signed)
ANTICOAGULATION CONSULT NOTE - Follow-Up Consult  Pharmacy Consult for warfarin Indication: atrial fibrillation  Patient Measurements: Height: 5\' 8"  (172.7 cm) Weight: 195 lb 12.3 oz (88.8 kg) IBW/kg (Calculated) : 68.4  Vital Signs: Temp: 97.1 F (36.2 C) (05/07 0531) Temp Source: Oral (05/07 0531) BP: 130/51 (05/07 0531) Pulse Rate: 63 (05/07 0531)  Labs:  Recent Labs  07/16/16 0955 07/16/16 1011 07/17/16 0438  HGB 12.7* 11.9* 12.9*  HCT 38.2* 35.0* 37.9*  PLT 320  --  341  LABPROT 37.3*  --  33.3*  INR 3.67  --  3.18  CREATININE 2.27* 2.30* 1.81*    Estimated Creatinine Clearance: 25.9 mL/min (A) (by C-G formula based on SCr of 1.81 mg/dL (H)).  Assessment: [age] YOM with h/o Afib (CHASDVASc = 4), on warfarin PTA. Pt's INR was supratherapeutic at 3.67. Pharmacy consulted to hold warfarin 5/6 and resume management 5/7.   INR today remains slightly SUPRAtherapeutic (INR 3.18 << 3.67, goal of 2-3). CBC stable. No bleeding noted at this time. Diet ordered, no new DDI noted.   PTA dose: 2.5 mg daily EXCEPT for 3.75 mg on MWF  Goal of Therapy:  INR 2-3 Monitor platelets by anticoagulation protocol: Yes   Plan:  1. Warfarin 2.5 mg x 1 dose at 1800 today 2. Will continue to monitor for any signs/symptoms of bleeding and will follow up with PT/INR in the a.m.   Thank you for allowing pharmacy to be a part of this patient's care.  Georgina PillionElizabeth Modupe Shampine, PharmD, BCPS Clinical Pharmacist Pager: [phone] Clinical phone for 07/17/2016 from 7a-3:30p: [phone] If after 3:30p, please call main pharmacy at: [phone] 07/17/2016 9:54 AM

## 2016-07-17 NOTE — Care Management Obs Status (Signed)
MEDICARE OBSERVATION STATUS NOTIFICATION   Patient Details  Name: Jordan [name] MRN: [id number] Date of Birth: [birth year]   Medicare Observation Status Notification Given:  Yes    Yarissa Reining, Annamarie MajorCheryl U, RN 07/17/2016, 12:13 PM

## 2016-07-17 NOTE — Progress Notes (Signed)
Pt voiding adequate amounts. Large BM this morning. Appetite appropriate.

## 2016-07-17 NOTE — Evaluation (Signed)
Occupational Therapy Evaluation Patient Details Name: Jordan [name] MRN: [id number] DOB: May 20, 1918 Today's Date: 07/17/2016    History of Present Illness 83 y.o. male who presents to ED with back pain and constipation.  medical history significant for PAF on Coumadin, hypothyroidism, DCHF, CKD, chronic back pain, s/p R hip arthroplasty 04/2015,  chronic constipation since a fall in 06/2016.    Clinical Impression   Pt living with his daughter was performing his own ADLs PTA. Currently, pt requires Min guard A for ADLs and functional mobility and Mod A for LB ADLs. Pt would benefit from further acute OT to increase pt safety and independence. Recommend dc home with supervision to increase safety.     Follow Up Recommendations  No OT follow up;Supervision/Assistance - 24 hour    Equipment Recommendations  None recommended by OT (Pt confirms that have DME/AE)    Recommendations for Other Services PT consult     Precautions / Restrictions Precautions Precautions: Fall      Mobility Bed Mobility               General bed mobility comments: Pt in recliner upon arrival  Transfers Overall transfer level: Needs assistance Equipment used: Rolling walker (2 wheeled) Transfers: Sit to/from Stand Sit to Stand: Min guard         General transfer comment: Pt demonstrating good hand placement and strength for sit<>stand    Balance Overall balance assessment: No apparent balance deficits (not formally assessed)                                         ADL either performed or assessed with clinical judgement   ADL Overall ADL's : Needs assistance/impaired Eating/Feeding: Set up;Sitting   Grooming: Min guard;Standing;Oral care Grooming Details (indicate cue type and reason): Demonstrates good balance while performing grooming at sink. Min guard just for safety. Upper Body Bathing: Set up;Sitting   Lower Body Bathing: Min guard;Sit to/from stand;Moderate  assistance   Upper Body Dressing : Set up;Sitting   Lower Body Dressing: Sit to/from stand;Moderate assistance   Toilet Transfer: Min guard;Ambulation;RW (Simulated to recliner)           Functional mobility during ADLs: Min guard;Rolling walker       Vision         Perception     Praxis      Pertinent Vitals/Pain Pain Assessment: Faces Faces Pain Scale: No hurt Pain Intervention(s): Monitored during session     Hand Dominance Right   Extremity/Trunk Assessment Upper Extremity Assessment Upper Extremity Assessment: Overall WFL for tasks assessed   Lower Extremity Assessment Lower Extremity Assessment: Generalized weakness   Cervical / Trunk Assessment Cervical / Trunk Assessment: Kyphotic   Communication Communication Communication: HOH   Cognition Arousal/Alertness: Awake/alert Behavior During Therapy: WFL for tasks assessed/performed Overall Cognitive Status: Within Functional Limits for tasks assessed                                     General Comments  Pt's daughters present for session. Pt with edema in BLE - elevated legs and removed socks at end of session    Exercises     Shoulder Instructions      Home Living Family/patient expects to be discharged to:: Private residence Living Arrangements: Children Available Help at Discharge:  Family;Available PRN/intermittently (Daughter) Type of Home: House Home Access: Stairs to enter Entergy CorporationEntrance Stairs-Number of Steps: 2 Entrance Stairs-Rails: Left;Right Home Layout: Two level;Able to live on main level with bedroom/bathroom     Bathroom Shower/Tub: Tub/shower unit   Bathroom Toilet: Handicapped height     Home Equipment: Environmental consultantWalker - 2 wheels;Toilet riser          Prior Functioning/Environment Level of Independence: Independent with assistive device(s)        Comments: Pt performing ADLs and [facility]. Currently, sponge baths due to  difficulty getting in and out of tub        OT Problem List: Decreased strength;Decreased range of motion;Decreased activity tolerance;Decreased knowledge of use of DME or AE;Increased edema      OT Treatment/Interventions: Self-care/ADL training;Therapeutic exercise;Energy conservation;DME and/or AE instruction;Therapeutic activities;Patient/family education    OT Goals(Current goals can be found in the care plan section) Acute Rehab OT Goals Patient Stated Goal: Go home OT Goal Formulation: With patient Time For Goal Achievement: 07/31/16 Potential to Achieve Goals: Good ADL Goals Pt Will Perform Grooming: with set-up;with supervision;standing Pt Will Perform Upper Body Dressing: with modified independence;standing Pt Will Perform Lower Body Dressing: sit to/from stand;with min assist Pt Will Transfer to Toilet: with min guard assist;bedside commode;ambulating  OT Frequency: Min 3X/week   Barriers to D/C:            Co-evaluation              AM-PAC PT "6 Clicks" Daily Activity     Outcome Measure Help from another person eating meals?: None Help from another person taking care of personal grooming?: A Little Help from another person toileting, which includes using toliet, bedpan, or urinal?: A Little Help from another person bathing (including washing, rinsing, drying)?: A Lot Help from another person to put on and taking off regular upper body clothing?: A Little Help from another person to put on and taking off regular lower body clothing?: A Lot 6 Click Score: 17   End of Session Equipment Utilized During Treatment: Gait belt;Rolling walker Nurse Communication: Mobility status  Activity Tolerance: Patient tolerated treatment well Patient left: in chair;with call bell/phone within reach;with family/visitor present  OT Visit Diagnosis: Unsteadiness on feet (R26.81);Muscle weakness (generalized) (M62.81);History of falling (Z91.81)                Time:  1142-1200 OT Time Calculation (min): 18 min Charges:  OT General Charges $OT Visit: 1 Procedure OT Evaluation $OT Eval Low Complexity: 1 Procedure G-Codes: OT G-codes **NOT FOR INPATIENT CLASS** Functional Assessment Tool Used: Clinical judgement Functional Limitation: Self care Self Care Current Status (A2130(G8987): At least 1 percent but less than 20 percent impaired, limited or restricted Self Care Goal Status (Q6578(G8988): 0 percent impaired, limited or restricted   [facility] Center-GratiotCharis Zaelyn Noack, OTR/L [phone]  Theodoro GristCharis M Jesiah Yerby 07/17/2016, 1:26 PM

## 2016-07-17 NOTE — Evaluation (Signed)
Physical Therapy Evaluation Patient Details Name: Jordan [name] MRN: [id number] DOB: 12/08/1918 Today's Date: 07/17/2016   History of Present Illness  83 y.o. male who presents to ED with back pain and constipation.  medical history significant for PAF on Coumadin, hypothyroidism, DCHF, CKD, chronic back pain, s/p R hip arthroplasty 04/2015,  chronic constipation since a fall in 06/2016.  Clinical Impression  Pt admitted with above diagnosis. Pt currently with functional limitations due to the deficits listed below (see PT Problem List). Moving slowly, but not needing much physical assist, and I anticipate good progress ; considered HHPT follow up, but family has made it clear pt is not homebound; Discussed Outpt PT;  Pt will benefit from skilled PT to increase their independence and safety with mobility to allow discharge to the venue listed below.       Follow Up Recommendations Outpatient PT;Other (comment) (will consider HHPT, pt is not homebound, so Outpt)    Equipment Recommendations  None recommended by PT    Recommendations for Other Services       Precautions / Restrictions Precautions Precautions: Fall      Mobility  Bed Mobility               General bed mobility comments: Pt in recliner upon arrival  Transfers Overall transfer level: Needs assistance Equipment used: Rolling walker (2 wheeled) Transfers: Sit to/from Stand Sit to Stand: Min guard         General transfer comment: Pt demonstrating good hand placement and strength for sit<>stand  Ambulation/Gait Ambulation/Gait assistance: Min guard (without physical contact) Ambulation Distance (Feet): 100 Feet Assistive device: Rolling walker (2 wheeled) Gait Pattern/deviations: Step-through pattern;Decreased step length - right;Decreased step length - left;Decreased stride length;Trunk flexed     General Gait Details: Cues to self-monitor for activity tolerance  Stairs            Wheelchair  Mobility    Modified Rankin (Stroke Patients Only)       Balance Overall balance assessment: No apparent balance deficits (not formally assessed)                                           Pertinent Vitals/Pain Pain Assessment: No/denies pain Faces Pain Scale: No hurt Pain Intervention(s): Monitored during session    Home Living Family/patient expects to be discharged to:: Private residence Living Arrangements: Children Available Help at Discharge: Family;Available PRN/intermittently (Daughter) Type of Home: House Home Access: Stairs to enter Entrance Stairs-Rails: Lawyer of Steps: 2 Home Layout: Two level;Able to live on main level with bedroom/bathroom Home Equipment: Dan Humphreys - 2 wheels;Toilet riser      Prior Function Level of Independence: Independent with assistive device(s)         Comments: Pt performing ADLs and was spending his day time hours at the senior center. Currently, sponge baths due to difficulty getting in and out of tub     Hand Dominance   Dominant Hand: Right    Extremity/Trunk Assessment   Upper Extremity Assessment Upper Extremity Assessment: Overall WFL for tasks assessed    Lower Extremity Assessment Lower Extremity Assessment: Generalized weakness    Cervical / Trunk Assessment Cervical / Trunk Assessment: Kyphotic  Communication   Communication: HOH  Cognition Arousal/Alertness: Awake/alert Behavior During Therapy: WFL for tasks assessed/performed Overall Cognitive Status: Within Functional Limits for tasks assessed  General Comments General comments (skin integrity, edema, etc.): Pt's daughters present for session. Pt with edema in BLE - elevated legs and removed socks at end of session    Exercises     Assessment/Plan    PT Assessment Patient needs continued PT services  PT Problem List Decreased strength;Decreased range  of motion;Decreased activity tolerance;Decreased balance;Decreased mobility;Decreased knowledge of use of DME       PT Treatment Interventions DME instruction;Gait training;Stair training;Functional mobility training;Therapeutic activities;Therapeutic exercise;Patient/family education    PT Goals (Current goals can be found in the Care Plan section)  Acute Rehab PT Goals Patient Stated Goal: Go home PT Goal Formulation: With patient Time For Goal Achievement: 07/31/16 Potential to Achieve Goals: Good    Frequency Min 3X/week   Barriers to discharge        Co-evaluation               AM-PAC PT "6 Clicks" Daily Activity  Outcome Measure Difficulty turning over in bed (including adjusting bedclothes, sheets and blankets)?: Total Difficulty moving from lying on back to sitting on the side of the bed? : A Lot Difficulty sitting down on and standing up from a chair with arms (e.g., wheelchair, bedside commode, etc,.)?: None Help needed moving to and from a bed to chair (including a wheelchair)?: None Help needed walking in hospital room?: None Help needed climbing 3-5 steps with a railing? : A Little 6 Click Score: 18    End of Session Equipment Utilized During Treatment: Gait belt Activity Tolerance: Patient tolerated treatment well Patient left: in chair;with call bell/phone within reach;with family/visitor present Nurse Communication: Mobility status PT Visit Diagnosis: Muscle weakness (generalized) (M62.81)    Time: [id number] PT Time Calculation (min) (ACUTE ONLY): 15 min   Charges:   PT Evaluation $PT Eval Low Complexity: 1 Procedure     PT G Codes:   PT G-Codes **NOT FOR INPATIENT CLASS** Functional Assessment Tool Used: Clinical judgement Functional Limitation: Mobility: Walking and moving around Mobility: Walking and Moving Around Current Status (V4098(G8978): At least 1 percent but less than 20 percent impaired, limited or restricted Mobility: Walking and Moving  Around Goal Status [phone](G8979): 0 percent impaired, limited or restricted    Van ClinesHolly Melbourne Jakubiak, PT  Acute Rehabilitation Services Pager [phone] Office [phone]   [name] 07/17/2016, 1:56 PM

## 2016-07-17 NOTE — Discharge Instructions (Signed)

## 2016-07-17 NOTE — Progress Notes (Signed)
PROGRESS NOTE    Jordan [name]  ZOX:[id number] DOB: 1918/11/21 DOA: 07/16/2016 PCP: Kaleen Mask, MD    Brief Narrative:  Patient is a 83 year old gentleman history of paroxysmal atrial fibrillation on Coumadin, hypothyroidism, chronic diastolic heart failure, chronic kidney disease stage IV, chronic back pain presenting to the ED with complaints of chronic constipation after a fall in April 2018 with low back pain.Patient seen in the ED successfully disimpacted.CT abdomen and pelvis done as well as CT of the L-spine done with acute/subacute L2 compression fracture.   Assessment & Plan:   Principal Problem:   Fecal impaction (HCC) Active Problems:   Bradycardia   Hypothyroidism   Atrial fibrillation (HCC)   COPD (chronic obstructive pulmonary disease) (HCC)   Chronic anticoagulation   Chronic diastolic heart failure (HCC)   Closed right hip fracture (HCC)   Chronic kidney disease (CKD) stage G3a/A1   Frequent PVCs   AKI (acute kidney injury) (HCC)   GERD (gastroesophageal reflux disease)  #1 fecal impaction Patient was admitted with complaints of abdominal pain and constipation and noted to have fecal impaction. Patient was disimpacted in the emergency room.CT abdomen and pelvis negative for any acute abnormalities. Will give patient a soapsuds enema 1. Increase MiraLAX to twice a day. Place on Senokot-S twice a day. Follow.  #2 acute on chronic back pain/Status post fall April 2018 CT L-spine with acute/subacute L2 compression fracture. Patient with a prior history of right hip arthroplasty in 2017. Patient with no neurological abnormalities. Patient with improved back painper family. Patient ambulates with aid of a walker. PT/OT. Pain management.  #3 acute on chronic kidney disease stage IV Likely secondary to prerenal azotemia in the setting of dehydration and diuretics.baseline creatinine 1.7-1.9. Renal function improving. Continue hydration. Continue to hold  diuretics and nephrotoxic agents. Follow.  #4 hypothyroidism Stable. Check a TSH.Continue home dose Synthroid.  #5 atrial fibrillation CHA2DS2VASC SCORE 4 Currently rate controlled. Coreg held secondary to low diastolic blood pressure.nce blood pressure improved with hydration resume Coreg for rate control.INR at 3.18. Hold Coumadin. Coumadin per pharmacy.  #6 gastroesophageal reflux disease PPI.  #7chronic diastolic heart failure Stable. Compensated. Diuretics on hold secondary to acute on chronic kidney disease stage IV. Monitor fluid status closely.  #8 hypertension Patient with low diastolic blood pressure. Hold antihypertensive medications.  #9 COPD Stable. Continue inhalers and oxygen.  #10 abdominal pain Secondary to problem #1. See problem #1.    DVT prophylaxis: Coumadin Code Status: full Family Communication: updated patient and family at bedside. Disposition Plan: home once frequent impaction has resolved and improvement with back pain.   Consultants:   none  Procedures:   CT abdomen and pelvis 07/16/2016  CT L-spine 07/16/2016  Antimicrobials:   none   Subjective: Patient denies any further abdominal pain. No nausea. No vomiting. Per wife back pain is also improved. Patient with clinical improvement. Patient with bowel movements.  Objective: Vitals:   07/16/16 1425 07/16/16 2123 07/17/16 0500 07/17/16 0531  BP:  138/65  (!) 130/51  Pulse:  70  63  Resp:  17  17  Temp:  97.4 F (36.3 C)  97.1 F (36.2 C)  TempSrc:  Oral  Oral  SpO2:  95%  94%  Weight: 82.3 kg (181 lb 6.4 oz) 82.8 kg (182 lb 9.6 oz) 88.8 kg (195 lb 12.3 oz)   Height: 5\' 8"  (1.727 m)       Intake/Output Summary (Last 24 hours) at 07/17/16 1001 Last data filed at  07/17/16 0650  Gross per 24 hour  Intake           583.33 ml  Output              775 ml  Net          -191.67 ml   Filed Weights   07/16/16 1425 07/16/16 2123 07/17/16 0500  Weight: 82.3 kg (181 lb 6.4 oz)  82.8 kg (182 lb 9.6 oz) 88.8 kg (195 lb 12.3 oz)    Examination:  General exam: Appears calm and comfortable  Respiratory system: Clear to auscultation. Respiratory effort normal. Cardiovascular system: S1 & S2 heard, RRR. No JVD, murmurs, rubs, gallops or clicks. No pedal edema. Gastrointestinal system: Abdomen is nondistended, soft and nontender. No organomegaly or masses felt. Normal bowel sounds heard. Central nervous system: Alert and oriented. No focal neurological deficits. Extremities: Symmetric 5 x 5 power. Skin: No rashes, lesions or ulcers Psychiatry: Judgement and insight appear normal. Mood & affect appropriate.     Data Reviewed: I have personally reviewed following labs and imaging studies  CBC:  Recent Labs Lab 07/16/16 0955 07/16/16 1011 07/17/16 0438  WBC 10.6*  --  10.5  NEUTROABS 6.5  --   --   HGB 12.7* 11.9* 12.9*  HCT 38.2* 35.0* 37.9*  MCV 95.7  --  95.5  PLT 320  --  341   Basic Metabolic Panel:  Recent Labs Lab 07/16/16 0955 07/16/16 1011 07/17/16 0438  NA 140 142 140  K 3.8 3.8 3.7  CL 107 105 107  CO2 24  --  23  GLUCOSE 120* 117* 89  BUN 54* 62* 43*  CREATININE 2.27* 2.30* 1.81*  CALCIUM 8.5*  --  8.2*   GFR: Estimated Creatinine Clearance: 25.9 mL/min (A) (by C-G formula based on SCr of 1.81 mg/dL (H)). Liver Function Tests:  Recent Labs Lab 07/16/16 0955 07/17/16 0438  AST 18 21  ALT 13* 13*  ALKPHOS 191* 191*  BILITOT 0.8 1.1  PROT 6.7 6.4*  ALBUMIN 3.3* 3.3*   No results for input(s): LIPASE, AMYLASE in the last 168 hours. No results for input(s): AMMONIA in the last 168 hours. Coagulation Profile:  Recent Labs Lab 07/16/16 0955 07/17/16 0438  INR 3.67 3.18   Cardiac Enzymes: No results for input(s): CKTOTAL, CKMB, CKMBINDEX, TROPONINI in the last 168 hours. BNP (last 3 results) No results for input(s): PROBNP in the last 8760 hours. HbA1C: No results for input(s): HGBA1C in the last 72 hours. CBG: No  results for input(s): GLUCAP in the last 168 hours. Lipid Profile: No results for input(s): CHOL, HDL, LDLCALC, TRIG, CHOLHDL, LDLDIRECT in the last 72 hours. Thyroid Function Tests: No results for input(s): TSH, T4TOTAL, FREET4, T3FREE, THYROIDAB in the last 72 hours. Anemia Panel: No results for input(s): VITAMINB12, FOLATE, FERRITIN, TIBC, IRON, RETICCTPCT in the last 72 hours. Sepsis Labs: No results for input(s): PROCALCITON, LATICACIDVEN in the last 168 hours.  No results found for this or any previous visit (from the past 240 hour(s)).       Radiology Studies: Ct Abdomen Pelvis Wo Contrast  Result Date: 07/16/2016 CLINICAL DATA:  Fall 2 weeks prior. Low back pain. Inpatient. History of cholecystectomy and partial splenectomy. EXAM: CT ABDOMEN AND PELVIS WITHOUT CONTRAST TECHNIQUE: Multidetector CT imaging of the abdomen and pelvis was performed following the standard protocol without IV contrast. COMPARISON:  12/01/2006 CT abdomen. FINDINGS: Lower chest: Patchy reticulation at both lung bases, similar to the 2008 scan. Irregular 2.4  x 1.7 cm nodular focus of consolidation in the peripheral right lower lobe is new (series 5/ image 5). Several additional 3-4 mm solid pulmonary nodules scattered at both lung bases appear generally centrilobular in distribution, several of which were present on 12/01/2006 chest CT. Coronary atherosclerosis. Hepatobiliary: Normal liver size. Scattered granulomatous calcifications throughout the liver. No liver mass. Cholecystectomy. No biliary ductal dilatation. Pancreas: Diffuse fatty infiltration of the otherwise normal pancreas, with no pancreatic mass or duct dilation. Spleen: Status post splenectomy. Stable scattered splenules in the splenectomy bed. Adrenals/Urinary Tract: Normal adrenals. No hydronephrosis. No renal stones. Exophytic 1.8 cm renal cyst in the lateral upper right kidney. No additional contour deforming renal lesions. Limited bladder  visualization due to streak artifact from the hip hardware. Several small bladder diverticula scattered throughout the bladder wall. No definite bladder wall thickening. Stomach/Bowel: Small to moderate hiatal hernia. Otherwise collapsed and grossly normal stomach. Normal caliber small bowel with no small bowel wall thickening. Normal appendix. Mild-to-moderate diverticulosis in the descending and sigmoid colon, with no large bowel wall thickening or pericolonic fat stranding. Oral contrast reaches the distal colon. Vascular/Lymphatic: Atherosclerotic nonaneurysmal abdominal aorta. No pathologically enlarged lymph nodes in the abdomen or pelvis. Reproductive: Prostate poorly visualized due to streak artifact. Probable top-normal size of the prostate with nonspecific internal prostatic calcifications. Other: No pneumoperitoneum, ascites or focal fluid collection. Musculoskeletal: No aggressive appearing focal osseous lesions. Status post bilateral total hip arthroplasty. No pelvic diastasis. No hip dislocation. Mild inferior L2 vertebral compression fracture of indeterminate chronicity, new since 12/01/2006. No additional fractures. Moderate thoracolumbar spondylosis. IMPRESSION: 1. Mild L2 vertebral compression fracture of indeterminate chronicity, new since 12/01/2006 . 2. Irregular nodular focus of consolidation in the right lower lobe, nonspecific, more likely inflammatory. Recommend follow-up chest CT in 3 months. 3. Additional findings include aortic atherosclerosis, coronary atherosclerosis, small to moderate hiatal hernia and mild-to-moderate distal colonic diverticulosis. Electronically Signed   By: Delbert PhenixJason A Poff M.D.   On: 07/16/2016 17:43   Dg Chest 2 View  Result Date: 07/16/2016 CLINICAL DATA:  COPD.  Shortness of breath. EXAM: CHEST  2 VIEW COMPARISON:  05/10/2015.  CT of 06/13/2007 FINDINGS: Lateral view degraded by patient arm position. Hyperinflation. Midline trachea. Borderline cardiomegaly.  Mediastinal contours otherwise within normal limits. No pleural effusion or pneumothorax. Right upper lobe and lateral right lower lobe scarring. No lobar consolidation. Diffuse peribronchial thickening. No congestive failure. IMPRESSION: Hyperinflation, consistent with COPD. Multifocal right-sided scarring, without acute superimposed process. Electronically Signed   By: Jeronimo GreavesKyle  Talbot M.D.   On: 07/16/2016 18:18   Ct L-spine No Charge  Result Date: 07/16/2016 CLINICAL DATA:  Low back pain.  Recent fall.  Inpatient. EXAM: CT LUMBAR SPINE WITHOUT CONTRAST TECHNIQUE: Multidetector CT imaging of the lumbar spine was performed without intravenous contrast administration. Multiplanar CT image reconstructions were also generated. COMPARISON:  12/01/2006 CT abdomen. FINDINGS: Segmentation: 5 lumbar type vertebrae. Alignment: Mild levocurvature of the lumbar spine . There is 5 mm right lateral subluxation of L3 on L4. There is 11 mm left lateral subluxation of L4 on L5. Otherwise no significant spondylolisthesis. Vertebrae: There is a mild L2 vertebral compression fracture, new since 12/01/2006 and possibly acute, with approximately 20% loss of vertebral body height. Remaining lumbar vertebral bodies heights are preserved with no additional fractures. Paraspinal and other soft tissues: Aortic atherosclerosis. Partially visualized simple appearing 1.6 cm exophytic renal cyst in the lateral upper right kidney. Disc levels: Moderate to marked degenerative disc disease throughout the lumbar spine, most  prominent at L4-5 and L5-S1. Moderate facet arthropathy bilaterally in the lower lumbar spine. Has significant degenerative foraminal stenosis. IMPRESSION: 1. Mild L2 vertebral compression fracture, probably acute/early subacute. 2. Mild levocurvature of the lumbar spine with multilevel lateral subluxation as detailed. 3. Moderate to marked degenerative disc disease throughout the lumbar spine. 4. Moderate facet arthropathy  bilaterally in the lower lumbar spine. 5. Aortic atherosclerosis. Electronically Signed   By: Delbert Phenix M.D.   On: 07/16/2016 18:09        Scheduled Meds: . acetaminophen  1,000 mg Oral TID  . latanoprost  1 drop Both Eyes QHS  . levothyroxine  50 mcg Oral QAC breakfast  . pantoprazole  40 mg Oral Daily  . polyethylene glycol  17 g Oral BID  . senna-docusate  1 tablet Oral BID   Continuous Infusions: . sodium chloride 75 mL/hr at 07/17/16 0823     LOS: 0 days    Time spent: 35 minutes    THOMPSON,DANIEL, MD [facility] Pager [phone]  If 7PM-7AM, please contact night-coverage [email or web address] Password [email or web address] 07/17/2016, 10:01 AM

## 2016-07-18 ENCOUNTER — Encounter (HOSPITAL_COMMUNITY): Payer: Self-pay | Admitting: *Deleted

## 2016-07-18 DIAGNOSIS — S32020A Wedge compression fracture of second lumbar vertebra, initial encounter for closed fracture: Secondary | ICD-10-CM | POA: Diagnosis present

## 2016-07-18 DIAGNOSIS — S32020D Wedge compression fracture of second lumbar vertebra, subsequent encounter for fracture with routine healing: Secondary | ICD-10-CM

## 2016-07-18 LAB — CBC WITH DIFFERENTIAL/PLATELET
BASOS PCT: 0 %
Basophils Absolute: 0 10*3/uL (ref 0.0–0.1)
EOS ABS: 0.5 10*3/uL (ref 0.0–0.7)
EOS PCT: 5 %
HEMATOCRIT: 36.4 % — AB (ref 39.0–52.0)
Hemoglobin: 12.4 g/dL — ABNORMAL LOW (ref 13.0–17.0)
Lymphocytes Relative: 24 %
Lymphs Abs: 2.5 10*3/uL (ref 0.7–4.0)
MCH: 32.4 pg (ref 26.0–34.0)
MCHC: 34.1 g/dL (ref 30.0–36.0)
MCV: 95 fL (ref 78.0–100.0)
MONO ABS: 1.4 10*3/uL — AB (ref 0.1–1.0)
MONOS PCT: 14 %
Neutro Abs: 5.7 10*3/uL (ref 1.7–7.7)
Neutrophils Relative %: 57 %
PLATELETS: 317 10*3/uL (ref 150–400)
RBC: 3.83 MIL/uL — ABNORMAL LOW (ref 4.22–5.81)
RDW: 12.8 % (ref 11.5–15.5)
WBC: 10.1 10*3/uL (ref 4.0–10.5)

## 2016-07-18 LAB — RENAL FUNCTION PANEL
Albumin: 3.2 g/dL — ABNORMAL LOW (ref 3.5–5.0)
Anion gap: 9 (ref 5–15)
BUN: 42 mg/dL — AB (ref 6–20)
CALCIUM: 8.4 mg/dL — AB (ref 8.9–10.3)
CO2: 23 mmol/L (ref 22–32)
Chloride: 107 mmol/L (ref 101–111)
Creatinine, Ser: 1.83 mg/dL — ABNORMAL HIGH (ref 0.61–1.24)
GFR calc Af Amer: 34 mL/min — ABNORMAL LOW (ref >60)
GFR calc non Af Amer: 30 mL/min — ABNORMAL LOW (ref >60)
GLUCOSE: 95 mg/dL (ref 65–99)
Phosphorus: 2.5 mg/dL (ref 2.5–4.6)
Potassium: 4 mmol/L (ref 3.5–5.1)
SODIUM: 139 mmol/L (ref 135–145)

## 2016-07-18 LAB — PROTIME-INR
INR: 1.96
Prothrombin Time: 22.6 seconds — ABNORMAL HIGH (ref 11.4–15.2)

## 2016-07-18 LAB — MAGNESIUM: MAGNESIUM: 1.9 mg/dL (ref 1.7–2.4)

## 2016-07-18 MED ORDER — SENNOSIDES-DOCUSATE SODIUM 8.6-50 MG PO TABS: 1.0000 | ORAL_TABLET | Freq: Two times a day (BID) | ORAL | Status: DC

## 2016-07-18 MED ORDER — OLOPATADINE HCL 0.1 % OP SOLN
1.0000 [drp] | Freq: Two times a day (BID) | OPHTHALMIC | Status: DC
  Administered 2016-07-18: 1 [drp] via OPHTHALMIC
  Filled 2016-07-18: qty 5

## 2016-07-18 MED ORDER — POLYETHYLENE GLYCOL 3350 17 G PO PACK: 17.0000 g | PACK | Freq: Two times a day (BID) | ORAL | 0 refills | Status: DC

## 2016-07-18 MED ORDER — HYDROCHLOROTHIAZIDE 25 MG PO TABS
12.5000 mg | ORAL_TABLET | Freq: Every day | ORAL | 0 refills | Status: DC
Start: 2016-07-22 — End: 2016-10-23

## 2016-07-18 NOTE — Progress Notes (Signed)
ANTICOAGULATION CONSULT NOTE - Follow-Up Consult  Pharmacy Consult for warfarin Indication: atrial fibrillation  Patient Measurements: Height: 5\' 8"  (172.7 cm) Weight: 184 lb 14.4 oz (83.9 kg) IBW/kg (Calculated) : 68.4  Vital Signs: Temp: 97.6 F (36.4 C) (05/08 1045) Temp Source: Oral (05/08 1045) BP: 127/58 (05/08 1045) Pulse Rate: 76 (05/08 1055)  Labs:  Recent Labs  07/16/16 0955 07/16/16 1011 07/17/16 0438 07/18/16 0555 07/18/16 0844  HGB 12.7* 11.9* 12.9* 12.4*  --   HCT 38.2* 35.0* 37.9* 36.4*  --   PLT 320  --  341 317  --   LABPROT 37.3*  --  33.3*  --  22.6*  INR 3.67  --  3.18  --  1.96  CREATININE 2.27* 2.30* 1.81* 1.83*  --     Estimated Creatinine Clearance: 24.9 mL/min (A) (by C-G formula based on SCr of 1.83 mg/dL (H)).  Assessment: [age] YOM with h/o Afib (CHASDVASc = 4), on warfarin PTA. Pt's INR was supratherapeutic at 3.67. Pharmacy consulted to hold warfarin 5/6 and resume management 5/7.   INR is just below goal at 1.96 today after holding dose on 5/6. No new DDI noted. No bleeding noted. Plan is for discharge today.  PTA dose: 2.5 mg daily EXCEPT for 3.75 mg on MWF  Goal of Therapy:  INR 2-3 Monitor platelets by anticoagulation protocol: Yes   Plan:  - Resume home dosing upon discharge, due for warfarin 2.5 mg PO tonight - Recommend following up with warfarin clinic sooner than 5/21 with admit INR being high  Thank you for allowing pharmacy to be a part of this patient's care.  Loura BackJennifer Enon, PharmD, BCPS Clinical Pharmacist Phone for today [phone] Main pharmacy - [phone] 07/18/2016 11:02 AM

## 2016-07-18 NOTE — Discharge Summary (Addendum)
Physician Discharge Summary  Jordan [name]:[id number] DOB: 06/22/1918 DOA: 07/16/2016  PCP: Kaleen Mask, MD  Admit date: 07/16/2016 Discharge date: 07/18/2016  Time spent: 65 minutes  Recommendations for Outpatient Follow-up:  1. Follow-up with Kaleen Mask, MD in 1-2 weeks. On follow-up patient will need a basic metabolic profile done to follow-up on electrolytes and renal function. Patient's L2 compression fracture on the to be reassessed. Patient's constipation will need to be followed up upon. Chest CT in 3 months to follow-up on irregular nodular focus noted on CT in the right lower lobe. 2. Follow-up in Coumadin clinic on Monday, 07/24/2016.   Discharge Diagnoses:  Principal Problem:   Fecal impaction (HCC) Active Problems:   Bradycardia   Hypothyroidism   Atrial fibrillation (HCC)   COPD (chronic obstructive pulmonary disease) (HCC)   Chronic anticoagulation   Chronic diastolic heart failure (HCC)   Closed right hip fracture (HCC)   Chronic kidney disease (CKD) stage G3a/A1   Frequent PVCs   AKI (acute kidney injury) (HCC)   GERD (gastroesophageal reflux disease)   Abdominal distention   Low back pain   Closed compression fracture of L2 lumbar vertebra (HCC)/ ACUTE/SUBACUTE   Discharge Condition: Stable and improved  Diet recommendation: Heart healthy  Filed Weights   07/16/16 2123 07/17/16 0500 07/17/16 2047  Weight: 82.8 kg (182 lb 9.6 oz) 88.8 kg (195 lb 12.3 oz) 83.9 kg (184 lb 14.4 oz)    History of present illness:  Per Dr. Debbora Presto is a 83 y.o. male with medical history significant for PAF on Coumadin, hypothyroidism, DCHF, CKD, chronic back pain, s/p R hip arthroplasty 04/2015,  chronic constipation since a fall in 06/2016,  presenting to the ED with 1week history of  Impaction. He denied any changes in his diet, but does admit hesitance to have a bowel movement due to his pain. In addition, he has had decreased mobility due to  back pain issues. He denies using narcotic pain meds. No other fall events. Denied decreased sensation in the rectal area. Denied GIB, abut did notice a few episodes of blood in the toilet paper prior to this event, possibly due to straining. CT A/P and LS spine pending . Patient has not been using laxatives on a regular basis. Had successfull disimpaction at the ED. Denies fevers, chills, night sweats, vision changes, or mucositis. Denied any respiratory complaints. Denied any chest pain or palpitations. Denied lower extremity swelling. Denies nausea, heartburn  . Denied any dysuria. Denied abnormal skin rashes, or neuropathy. NO confusion is noted denies  epistaxis, hematemesis, hematuria .    ED Course:  BP (!) 132/55   Pulse (!) 27   Temp 97.4 F (36.3 C) (Oral)   Resp 16   Ht 5\' 8"  (1.727 m)   Wt 86.2 kg (190 lb)   SpO2 92%   BMI 28.89 kg/m   Successful disimpaction sodium 142 potassium 3.8 glucose 117 BUN 62 creatinine 2.3  calcium 8.5  alkaline phosphatase 191  albumin 3.3  white count 10.6  hemoglobin 11.9 Hemoccult negative  PT 37.3 with INR 3.67 CT of the abdomen and pelvis and CT of the LS spine pending CXR pending   Hospital Course:  #1 fecal impaction Patient was admitted with complaints of abdominal pain and constipation and noted to have fecal impaction. Patient was disimpacted in the emergency room.CT abdomen and pelvis negative for any acute abnormalities. Patient was given soapsuds enema 1. Placed on MiraLAX twice daily  as well as Senokot-S twice daily with resolution of fecal impaction. Patient be discharged home on a bowel regimen.   #2 acute on chronic back pain/Status post fall April 2018/acute/subacute L2 compression fracture CT L-spine with acute/subacute L2 compression fracture. Patient with a prior history of right hip arthroplasty in 2017. Patient with no neurological abnormalities. Patient with improved back pain per family. Patient ambulates with aid of a  walker. PT/OT. Pain management. Outpatient follow-up.  #3 acute on chronic kidney disease stage IV Likely secondary to prerenal azotemia in the setting of dehydration and diuretics.baseline creatinine 1.7-1.9. Renal function improved with hydration. Patient's diuretics were held and will be resumed a few days post discharge. Patient's renal function was back to baseline by day of discharge.  #4 hypothyroidism Stable. Maintained on home dose Synthroid.  #5 atrial fibrillation CHA2DS2VASC SCORE 4 Patient remained rate controlled during the hospitalization. Coreg held secondary to low diastolic blood pressure.and resumed on discharge. Patient's Coumadin was held as INR on admission was supratherapeutic at 3.67. Outpatient follow-up.   #6 gastroesophageal reflux disease Patient was maintained on a PPI.  #7chronic diastolic heart failure Stable. Compensated. Diuretics were held secondary to acute on chronic kidney disease stage IV. Patient was hydrated gently with IV fluids. Patient's diuretics will be resumed 3-4 days post discharge.  #8 hypertension Patient with low diastolic blood pressure. Patient's antihypertensive medications were held and patient hydrated with IV fluids. Blood pressure improved by day of discharge.  #9 COPD Stable. Patient placed on inhalers and oxygen.  #10 abdominal pain Secondary to problem #1. See problem #1. Resolved with disimpaction and bowel movements.  #11 abnormal CT chest CT chest done during this hospitalization showed an irregular nodular focus of consolidation in the right lower lobe likely inflammatory. Repeat chest CT in 3 months for follow-up is recommended.    Procedures:  CT abd/pelvis 07/16/2016   Chest x-ray 07/16/2016  CT L-spine 07/16/2016  Consultations:  None   Discharge Exam: Vitals:   07/18/16 1045 07/18/16 1055  BP: (!) 127/58   Pulse: (!) 40 76  Resp: 17   Temp: 97.6 F (36.4 C)     General:  NAD Cardiovascular: RRR Respiratory: CTAB  Discharge Instructions   Discharge Instructions    Diet - low sodium heart healthy    Complete by:  As directed    Increase activity slowly    Complete by:  As directed      Current Discharge Medication List    START taking these medications   Details  polyethylene glycol (MIRALAX / GLYCOLAX) packet Take 17 g by mouth 2 (two) times daily. Qty: 14 each, Refills: 0    senna-docusate (SENOKOT-S) 8.6-50 MG tablet Take 1 tablet by mouth 2 (two) times daily.      CONTINUE these medications which have CHANGED   Details  hydrochlorothiazide (HYDRODIURIL) 25 MG tablet Take 0.5 tablets (12.5 mg total) by mouth daily. Qty: 15 tablet, Refills: 0      CONTINUE these medications which have NOT CHANGED   Details  acetaminophen (TYLENOL) 500 MG tablet Take 1,000 mg by mouth 3 (three) times daily.     albuterol (PROVENTIL HFA;VENTOLIN HFA) 108 (90 Base) MCG/ACT inhaler Inhale 2 puffs into the lungs every 6 (six) hours as needed for wheezing or shortness of breath.    bisacodyl (DULCOLAX) 5 MG EC tablet Take 5 mg by mouth daily as needed for moderate constipation.    latanoprost (XALATAN) 0.005 % ophthalmic solution Place 1 drop into both  eyes at bedtime. For glaucoma    levothyroxine (SYNTHROID, LEVOTHROID) 50 MCG tablet Take 50 mcg by mouth daily before breakfast.    pantoprazole (PROTONIX) 40 MG tablet Take 1 tablet (40 mg total) by mouth daily.    warfarin (COUMADIN) 2.5 MG tablet Take as directed by coumadin clinic Qty: 40 tablet, Refills: 3       Allergies  Allergen Reactions  . Diltiazem Hcl Er Beads Other (See Comments)    Tachycardia  . Metoprolol Succinate Er [Metoprolol Tartrate] Other (See Comments)    hallucinations   Follow-up Information    Kaleen Mask, MD. Schedule an appointment as soon as possible for a visit in 2 week(s).   Specialty:  Family Medicine Why:  f/u in 1-2 weeks. Contact information: [address] [place] [address] [phone]        coumadin clinic Follow up on 07/24/2016.   Why:  f/u in coumadin clinic On Monday 07/24/2016           The results of significant diagnostics from this hospitalization (including imaging, microbiology, ancillary and laboratory) are listed below for reference.    Significant Diagnostic Studies: Ct Abdomen Pelvis Wo Contrast  Result Date: 07/16/2016 CLINICAL DATA:  Fall 2 weeks prior. Low back pain. Inpatient. History of cholecystectomy and partial splenectomy. EXAM: CT ABDOMEN AND PELVIS WITHOUT CONTRAST TECHNIQUE: Multidetector CT imaging of the abdomen and pelvis was performed following the standard protocol without IV contrast. COMPARISON:  12/01/2006 CT abdomen. FINDINGS: Lower chest: Patchy reticulation at both lung bases, similar to the 2008 scan. Irregular 2.4 x 1.7 cm nodular focus of consolidation in the peripheral right lower lobe is new (series 5/ image 5). Several additional 3-4 mm solid pulmonary nodules scattered at both lung bases appear generally centrilobular in distribution, several of which were present on 12/01/2006 chest CT. Coronary atherosclerosis. Hepatobiliary: Normal liver size. Scattered granulomatous calcifications throughout the liver. No liver mass. Cholecystectomy. No biliary ductal dilatation. Pancreas: Diffuse fatty infiltration of the otherwise normal pancreas, with no pancreatic mass or duct dilation. Spleen: Status post splenectomy. Stable scattered splenules in the splenectomy bed. Adrenals/Urinary Tract: Normal adrenals. No hydronephrosis. No renal stones. Exophytic 1.8 cm renal cyst in the lateral upper right kidney. No additional contour deforming renal lesions. Limited bladder visualization due to streak artifact from the hip hardware. Several small bladder diverticula scattered throughout the bladder wall. No definite bladder wall thickening. Stomach/Bowel: Small to moderate hiatal hernia. Otherwise  collapsed and grossly normal stomach. Normal caliber small bowel with no small bowel wall thickening. Normal appendix. Mild-to-moderate diverticulosis in the descending and sigmoid colon, with no large bowel wall thickening or pericolonic fat stranding. Oral contrast reaches the distal colon. Vascular/Lymphatic: Atherosclerotic nonaneurysmal abdominal aorta. No pathologically enlarged lymph nodes in the abdomen or pelvis. Reproductive: Prostate poorly visualized due to streak artifact. Probable top-normal size of the prostate with nonspecific internal prostatic calcifications. Other: No pneumoperitoneum, ascites or focal fluid collection. Musculoskeletal: No aggressive appearing focal osseous lesions. Status post bilateral total hip arthroplasty. No pelvic diastasis. No hip dislocation. Mild inferior L2 vertebral compression fracture of indeterminate chronicity, new since 12/01/2006. No additional fractures. Moderate thoracolumbar spondylosis. IMPRESSION: 1. Mild L2 vertebral compression fracture of indeterminate chronicity, new since 12/01/2006 . 2. Irregular nodular focus of consolidation in the right lower lobe, nonspecific, more likely inflammatory. Recommend follow-up chest CT in 3 months. 3. Additional findings include aortic atherosclerosis, coronary atherosclerosis, small to moderate hiatal hernia and mild-to-moderate distal colonic diverticulosis. Electronically Signed  By: Delbert PhenixJason A Poff M.D.   On: 07/16/2016 17:43   Dg Chest 2 View  Result Date: 07/16/2016 CLINICAL DATA:  COPD.  Shortness of breath. EXAM: CHEST  2 VIEW COMPARISON:  05/10/2015.  CT of 06/13/2007 FINDINGS: Lateral view degraded by patient arm position. Hyperinflation. Midline trachea. Borderline cardiomegaly. Mediastinal contours otherwise within normal limits. No pleural effusion or pneumothorax. Right upper lobe and lateral right lower lobe scarring. No lobar consolidation. Diffuse peribronchial thickening. No congestive failure.  IMPRESSION: Hyperinflation, consistent with COPD. Multifocal right-sided scarring, without acute superimposed process. Electronically Signed   By: Jeronimo GreavesKyle  Talbot M.D.   On: 07/16/2016 18:18   Dg Pelvis 1-2 Views  Result Date: 07/06/2016 CLINICAL DATA:  Fall, pain. EXAM: PELVIS - 1-2 VIEW COMPARISON:  05/07/2015 FINDINGS: Bilateral hip replacements noted. No complicating feature. No acute bony abnormality. Specifically, no fracture, subluxation, or dislocation. Soft tissues are intact. IMPRESSION: No acute bony abnormality. Electronically Signed   By: Charlett NoseKevin  Dover M.D.   On: 07/06/2016 12:04   Ct L-spine No Charge  Result Date: 07/16/2016 CLINICAL DATA:  Low back pain.  Recent fall.  Inpatient. EXAM: CT LUMBAR SPINE WITHOUT CONTRAST TECHNIQUE: Multidetector CT imaging of the lumbar spine was performed without intravenous contrast administration. Multiplanar CT image reconstructions were also generated. COMPARISON:  12/01/2006 CT abdomen. FINDINGS: Segmentation: 5 lumbar type vertebrae. Alignment: Mild levocurvature of the lumbar spine . There is 5 mm right lateral subluxation of L3 on L4. There is 11 mm left lateral subluxation of L4 on L5. Otherwise no significant spondylolisthesis. Vertebrae: There is a mild L2 vertebral compression fracture, new since 12/01/2006 and possibly acute, with approximately 20% loss of vertebral body height. Remaining lumbar vertebral bodies heights are preserved with no additional fractures. Paraspinal and other soft tissues: Aortic atherosclerosis. Partially visualized simple appearing 1.6 cm exophytic renal cyst in the lateral upper right kidney. Disc levels: Moderate to marked degenerative disc disease throughout the lumbar spine, most prominent at L4-5 and L5-S1. Moderate facet arthropathy bilaterally in the lower lumbar spine. Has significant degenerative foraminal stenosis. IMPRESSION: 1. Mild L2 vertebral compression fracture, probably acute/early subacute. 2. Mild  levocurvature of the lumbar spine with multilevel lateral subluxation as detailed. 3. Moderate to marked degenerative disc disease throughout the lumbar spine. 4. Moderate facet arthropathy bilaterally in the lower lumbar spine. 5. Aortic atherosclerosis. Electronically Signed   By: Delbert PhenixJason A Poff M.D.   On: 07/16/2016 18:09   Dg Hip Unilat With Pelvis 2-3 Views Left  Result Date: 07/03/2016 CLINICAL DATA:  Fall.  Hip pain . EXAM: DG HIP (WITH OR WITHOUT PELVIS) 2-3V LEFT COMPARISON:  02/24/ 2017 . FINDINGS: Total left hip replacement. Hardware intact. Anatomic alignment. No acute abnormality. No evidence of fracture or dislocation. Exam stable from prior study. IMPRESSION: Left hip replacement good anatomic alignment. Hardware intact. No acute abnormality . Electronically Signed   By: Maisie Fushomas  Register   On: 07/03/2016 13:26    Microbiology: No results found for this or any previous visit (from the past 240 hour(s)).   Labs: Basic Metabolic Panel:  Recent Labs Lab 07/16/16 0955 07/16/16 1011 07/17/16 0438 07/18/16 0555  NA 140 142 140 139  K 3.8 3.8 3.7 4.0  CL 107 105 107 107  CO2 24  --  23 23  GLUCOSE 120* 117* 89 95  BUN 54* 62* 43* 42*  CREATININE 2.27* 2.30* 1.81* 1.83*  CALCIUM 8.5*  --  8.2* 8.4*  MG  --   --   --  1.9  PHOS  --   --   --  2.5   Liver Function Tests:  Recent Labs Lab 07/16/16 0955 07/17/16 0438 07/18/16 0555  AST 18 21  --   ALT 13* 13*  --   ALKPHOS 191* 191*  --   BILITOT 0.8 1.1  --   PROT 6.7 6.4*  --   ALBUMIN 3.3* 3.3* 3.2*   No results for input(s): LIPASE, AMYLASE in the last 168 hours. No results for input(s): AMMONIA in the last 168 hours. CBC:  Recent Labs Lab 07/16/16 0955 07/16/16 1011 07/17/16 0438 07/18/16 0555  WBC 10.6*  --  10.5 10.1  NEUTROABS 6.5  --   --  5.7  HGB 12.7* 11.9* 12.9* 12.4*  HCT 38.2* 35.0* 37.9* 36.4*  MCV 95.7  --  95.5 95.0  PLT 320  --  341 317   Cardiac Enzymes: No results for input(s):  CKTOTAL, CKMB, CKMBINDEX, TROPONINI in the last 168 hours. BNP: BNP (last 3 results) No results for input(s): BNP in the last 8760 hours.  ProBNP (last 3 results) No results for input(s): PROBNP in the last 8760 hours.  CBG: No results for input(s): GLUCAP in the last 168 hours.     SignedRamiro Harvest MD.  [facility] 07/18/2016, 12:44 PM

## 2016-07-18 NOTE — Care Management Note (Signed)
Case Management Note  Patient Details  Name: Jordan [name] MRN: [id number] Date of Birth: [birth year]  Subjective/Objective:        CM following for progression and d/c planning.             Action/Plan: 07/18/2016 Family now wishes to use HHPT rather than transport pt to Outpt PT, due to age and endurance. Met with family and pt , pt has DME. [facility] will provide [facility] services. Linus Mako [facility] rep notified and met with family. Plan to d/c this afternoon, no further needs identified.   Expected Discharge Date:  07/18/16               Expected Discharge Plan:  [place]  In-House Referral:  NA  Discharge planning Services  CM Consult  Post Acute Care Choice:  Home Health Choice offered to:  Adult Children  DME Arranged:  N/A DME Agency:  NA  HH Arranged:  RN, PT HH Agency:  [facility] (now [facility])  Status of Service:  Completed, signed off  If discussed at [place] of Stay Meetings, dates discussed:    Additional Comments:  Adron Bene, RN 07/18/2016, 12:43 PM

## 2016-07-24 ENCOUNTER — Ambulatory Visit (INDEPENDENT_AMBULATORY_CARE_PROVIDER_SITE_OTHER): Payer: Medicare Other

## 2016-07-24 DIAGNOSIS — I4891 Unspecified atrial fibrillation: Secondary | ICD-10-CM

## 2016-07-24 DIAGNOSIS — Z5181 Encounter for therapeutic drug level monitoring: Secondary | ICD-10-CM | POA: Diagnosis not present

## 2016-07-24 DIAGNOSIS — I480 Paroxysmal atrial fibrillation: Secondary | ICD-10-CM | POA: Diagnosis not present

## 2016-07-24 LAB — POCT INR: INR: 2.3

## 2016-09-04 ENCOUNTER — Ambulatory Visit (INDEPENDENT_AMBULATORY_CARE_PROVIDER_SITE_OTHER): Payer: Medicare Other | Admitting: *Deleted

## 2016-09-04 DIAGNOSIS — Z5181 Encounter for therapeutic drug level monitoring: Secondary | ICD-10-CM | POA: Diagnosis not present

## 2016-09-04 DIAGNOSIS — I4891 Unspecified atrial fibrillation: Secondary | ICD-10-CM | POA: Diagnosis not present

## 2016-09-04 LAB — POCT INR: INR: 3.6

## 2016-09-25 ENCOUNTER — Ambulatory Visit (INDEPENDENT_AMBULATORY_CARE_PROVIDER_SITE_OTHER): Payer: Medicare Other | Admitting: *Deleted

## 2016-09-25 DIAGNOSIS — I4891 Unspecified atrial fibrillation: Secondary | ICD-10-CM | POA: Diagnosis not present

## 2016-09-25 DIAGNOSIS — Z5181 Encounter for therapeutic drug level monitoring: Secondary | ICD-10-CM | POA: Diagnosis not present

## 2016-09-25 LAB — POCT INR: INR: 1.9

## 2016-10-06 ENCOUNTER — Encounter (INDEPENDENT_AMBULATORY_CARE_PROVIDER_SITE_OTHER): Payer: Self-pay

## 2016-10-06 ENCOUNTER — Ambulatory Visit (INDEPENDENT_AMBULATORY_CARE_PROVIDER_SITE_OTHER): Payer: Medicare Other | Admitting: Pharmacist

## 2016-10-06 DIAGNOSIS — Z5181 Encounter for therapeutic drug level monitoring: Secondary | ICD-10-CM | POA: Diagnosis not present

## 2016-10-06 DIAGNOSIS — I4891 Unspecified atrial fibrillation: Secondary | ICD-10-CM | POA: Diagnosis not present

## 2016-10-06 LAB — POCT INR: INR: 2.5

## 2016-10-23 ENCOUNTER — Inpatient Hospital Stay (HOSPITAL_COMMUNITY)
Admission: EM | Admit: 2016-10-23 | Discharge: 2016-10-27 | DRG: 191 | Disposition: A | Discharge status: Skilled Nursing Facility | Payer: Medicare Other | Attending: Family Medicine | Admitting: Family Medicine

## 2016-10-23 ENCOUNTER — Emergency Department (HOSPITAL_COMMUNITY): Payer: Medicare Other

## 2016-10-23 ENCOUNTER — Encounter (HOSPITAL_COMMUNITY): Payer: Self-pay | Admitting: Internal Medicine

## 2016-10-23 DIAGNOSIS — I493 Ventricular premature depolarization: Secondary | ICD-10-CM | POA: Diagnosis not present

## 2016-10-23 DIAGNOSIS — J441 Chronic obstructive pulmonary disease with (acute) exacerbation: Secondary | ICD-10-CM | POA: Diagnosis not present

## 2016-10-23 DIAGNOSIS — Z79899 Other long term (current) drug therapy: Secondary | ICD-10-CM

## 2016-10-23 DIAGNOSIS — I130 Hypertensive heart and chronic kidney disease with heart failure and stage 1 through stage 4 chronic kidney disease, or unspecified chronic kidney disease: Secondary | ICD-10-CM | POA: Diagnosis present

## 2016-10-23 DIAGNOSIS — Z96643 Presence of artificial hip joint, bilateral: Secondary | ICD-10-CM | POA: Diagnosis present

## 2016-10-23 DIAGNOSIS — R238 Other skin changes: Secondary | ICD-10-CM

## 2016-10-23 DIAGNOSIS — Z9119 Patient's noncompliance with other medical treatment and regimen: Secondary | ICD-10-CM

## 2016-10-23 DIAGNOSIS — I480 Paroxysmal atrial fibrillation: Secondary | ICD-10-CM | POA: Diagnosis present

## 2016-10-23 DIAGNOSIS — R1310 Dysphagia, unspecified: Secondary | ICD-10-CM | POA: Diagnosis present

## 2016-10-23 DIAGNOSIS — N184 Chronic kidney disease, stage 4 (severe): Secondary | ICD-10-CM | POA: Diagnosis present

## 2016-10-23 DIAGNOSIS — Z87891 Personal history of nicotine dependence: Secondary | ICD-10-CM

## 2016-10-23 DIAGNOSIS — R0603 Acute respiratory distress: Secondary | ICD-10-CM | POA: Diagnosis not present

## 2016-10-23 DIAGNOSIS — L909 Atrophic disorder of skin, unspecified: Secondary | ICD-10-CM

## 2016-10-23 DIAGNOSIS — R531 Weakness: Secondary | ICD-10-CM | POA: Diagnosis present

## 2016-10-23 DIAGNOSIS — Z8249 Family history of ischemic heart disease and other diseases of the circulatory system: Secondary | ICD-10-CM

## 2016-10-23 DIAGNOSIS — I4891 Unspecified atrial fibrillation: Secondary | ICD-10-CM | POA: Diagnosis present

## 2016-10-23 DIAGNOSIS — Z7901 Long term (current) use of anticoagulants: Secondary | ICD-10-CM | POA: Diagnosis not present

## 2016-10-23 DIAGNOSIS — I10 Essential (primary) hypertension: Secondary | ICD-10-CM | POA: Diagnosis present

## 2016-10-23 DIAGNOSIS — N1831 Chronic kidney disease, stage 3a: Secondary | ICD-10-CM | POA: Diagnosis present

## 2016-10-23 DIAGNOSIS — K648 Other hemorrhoids: Secondary | ICD-10-CM | POA: Diagnosis present

## 2016-10-23 DIAGNOSIS — E039 Hypothyroidism, unspecified: Secondary | ICD-10-CM | POA: Diagnosis present

## 2016-10-23 DIAGNOSIS — I5032 Chronic diastolic (congestive) heart failure: Secondary | ICD-10-CM | POA: Diagnosis present

## 2016-10-23 DIAGNOSIS — K625 Hemorrhage of anus and rectum: Secondary | ICD-10-CM | POA: Diagnosis present

## 2016-10-23 DIAGNOSIS — K219 Gastro-esophageal reflux disease without esophagitis: Secondary | ICD-10-CM | POA: Diagnosis not present

## 2016-10-23 DIAGNOSIS — I959 Hypotension, unspecified: Secondary | ICD-10-CM | POA: Diagnosis present

## 2016-10-23 DIAGNOSIS — R791 Abnormal coagulation profile: Secondary | ICD-10-CM | POA: Diagnosis present

## 2016-10-23 DIAGNOSIS — N183 Chronic kidney disease, stage 3 (moderate): Secondary | ICD-10-CM

## 2016-10-23 DIAGNOSIS — Z888 Allergy status to other drugs, medicaments and biological substances status: Secondary | ICD-10-CM

## 2016-10-23 LAB — CBC WITH DIFFERENTIAL/PLATELET
BASOS ABS: 0 10*3/uL (ref 0.0–0.1)
Basophils Relative: 0 %
EOS ABS: 0.4 10*3/uL (ref 0.0–0.7)
Eosinophils Relative: 4 %
HCT: 39.5 % (ref 39.0–52.0)
Hemoglobin: 13.7 g/dL (ref 13.0–17.0)
LYMPHS ABS: 2.8 10*3/uL (ref 0.7–4.0)
Lymphocytes Relative: 30 %
MCH: 34 pg (ref 26.0–34.0)
MCHC: 34.7 g/dL (ref 30.0–36.0)
MCV: 98 fL (ref 78.0–100.0)
Monocytes Absolute: 1 10*3/uL (ref 0.1–1.0)
Monocytes Relative: 11 %
NEUTROS ABS: 5.1 10*3/uL (ref 1.7–7.7)
Neutrophils Relative %: 55 %
PLATELETS: 204 10*3/uL (ref 150–400)
RBC: 4.03 MIL/uL — ABNORMAL LOW (ref 4.22–5.81)
RDW: 14 % (ref 11.5–15.5)
WBC: 9.3 10*3/uL (ref 4.0–10.5)

## 2016-10-23 LAB — I-STAT CHEM 8, ED
BUN: 40 mg/dL — ABNORMAL HIGH (ref 6–20)
CREATININE: 1.6 mg/dL — AB (ref 0.61–1.24)
Calcium, Ion: 1.15 mmol/L (ref 1.15–1.40)
Chloride: 110 mmol/L (ref 101–111)
GLUCOSE: 97 mg/dL (ref 65–99)
HCT: 39 % (ref 39.0–52.0)
HEMOGLOBIN: 13.3 g/dL (ref 13.0–17.0)
POTASSIUM: 4 mmol/L (ref 3.5–5.1)
Sodium: 144 mmol/L (ref 135–145)
TCO2: 25 mmol/L (ref 0–100)

## 2016-10-23 LAB — CBC
HEMATOCRIT: 38.3 % — AB (ref 39.0–52.0)
Hemoglobin: 13.2 g/dL (ref 13.0–17.0)
MCH: 33.1 pg (ref 26.0–34.0)
MCHC: 34.5 g/dL (ref 30.0–36.0)
MCV: 96 fL (ref 78.0–100.0)
Platelets: 218 10*3/uL (ref 150–400)
RBC: 3.99 MIL/uL — ABNORMAL LOW (ref 4.22–5.81)
RDW: 13.4 % (ref 11.5–15.5)
WBC: 7.4 10*3/uL (ref 4.0–10.5)

## 2016-10-23 LAB — TYPE AND SCREEN
ABO/RH(D): B NEG
Antibody Screen: NEGATIVE

## 2016-10-23 LAB — I-STAT TROPONIN, ED: Troponin i, poc: 0.01 ng/mL (ref 0.00–0.08)

## 2016-10-23 LAB — COMPREHENSIVE METABOLIC PANEL
ALT: 11 U/L — ABNORMAL LOW (ref 17–63)
ANION GAP: 8 (ref 5–15)
AST: 21 U/L (ref 15–41)
Albumin: 3.3 g/dL — ABNORMAL LOW (ref 3.5–5.0)
Alkaline Phosphatase: 106 U/L (ref 38–126)
BUN: 31 mg/dL — ABNORMAL HIGH (ref 6–20)
CALCIUM: 8.4 mg/dL — AB (ref 8.9–10.3)
CHLORIDE: 112 mmol/L — AB (ref 101–111)
CO2: 21 mmol/L — AB (ref 22–32)
Creatinine, Ser: 1.85 mg/dL — ABNORMAL HIGH (ref 0.61–1.24)
GFR calc non Af Amer: 29 mL/min — ABNORMAL LOW (ref >60)
GFR, EST AFRICAN AMERICAN: 34 mL/min — AB (ref >60)
Glucose, Bld: 115 mg/dL — ABNORMAL HIGH (ref 65–99)
Potassium: 4 mmol/L (ref 3.5–5.1)
SODIUM: 141 mmol/L (ref 135–145)
Total Bilirubin: 0.6 mg/dL (ref 0.3–1.2)
Total Protein: 5.8 g/dL — ABNORMAL LOW (ref 6.5–8.1)

## 2016-10-23 LAB — BRAIN NATRIURETIC PEPTIDE: B Natriuretic Peptide: 125.1 pg/mL — ABNORMAL HIGH (ref 0.0–100.0)

## 2016-10-23 LAB — URINALYSIS, ROUTINE W REFLEX MICROSCOPIC
BILIRUBIN URINE: NEGATIVE
Bacteria, UA: NONE SEEN
Glucose, UA: NEGATIVE mg/dL
Hgb urine dipstick: NEGATIVE
Ketones, ur: NEGATIVE mg/dL
LEUKOCYTES UA: NEGATIVE
Nitrite: NEGATIVE
PH: 5 (ref 5.0–8.0)
Protein, ur: 30 mg/dL — AB
SPECIFIC GRAVITY, URINE: 1.027 (ref 1.005–1.030)
Squamous Epithelial / LPF: NONE SEEN

## 2016-10-23 LAB — VITAMIN B12: VITAMIN B 12: 283 pg/mL (ref 180–914)

## 2016-10-23 LAB — I-STAT CG4 LACTIC ACID, ED: Lactic Acid, Venous: 2.26 mmol/L (ref 0.5–1.9)

## 2016-10-23 LAB — PROTIME-INR
INR: 3.3
Prothrombin Time: 34.3 seconds — ABNORMAL HIGH (ref 11.4–15.2)

## 2016-10-23 LAB — POC OCCULT BLOOD, ED: FECAL OCCULT BLD: NEGATIVE

## 2016-10-23 MED ORDER — ACETAMINOPHEN 650 MG RE SUPP: 650.0000 mg | Freq: Four times a day (QID) | RECTAL | Status: DC | PRN

## 2016-10-23 MED ORDER — SENNOSIDES-DOCUSATE SODIUM 8.6-50 MG PO TABS
1.0000 | ORAL_TABLET | Freq: Every day | ORAL | Status: DC
  Administered 2016-10-24 – 2016-10-27 (×4): 1 via ORAL
  Filled 2016-10-23 (×4): qty 1

## 2016-10-23 MED ORDER — GUAIFENESIN ER 600 MG PO TB12
600.0000 mg | ORAL_TABLET | Freq: Two times a day (BID) | ORAL | Status: DC
  Administered 2016-10-23 – 2016-10-27 (×8): 600 mg via ORAL
  Filled 2016-10-23 (×8): qty 1

## 2016-10-23 MED ORDER — ALBUTEROL SULFATE (2.5 MG/3ML) 0.083% IN NEBU: 2.5000 mg | INHALATION_SOLUTION | RESPIRATORY_TRACT | Status: DC | PRN

## 2016-10-23 MED ORDER — METHYLPREDNISOLONE SODIUM SUCC 125 MG IJ SOLR
125.0000 mg | Freq: Once | INTRAMUSCULAR | Status: AC
  Administered 2016-10-23: 125 mg via INTRAVENOUS
  Filled 2016-10-23: qty 2

## 2016-10-23 MED ORDER — METHYLPREDNISOLONE SODIUM SUCC 125 MG IJ SOLR
60.0000 mg | Freq: Two times a day (BID) | INTRAMUSCULAR | Status: DC
  Administered 2016-10-23 – 2016-10-27 (×8): 60 mg via INTRAVENOUS
  Filled 2016-10-23 (×8): qty 2

## 2016-10-23 MED ORDER — LATANOPROST 0.005 % OP SOLN
1.0000 [drp] | Freq: Every day | OPHTHALMIC | Status: DC
  Administered 2016-10-23 – 2016-10-26 (×4): 1 [drp] via OPHTHALMIC
  Filled 2016-10-23: qty 2.5

## 2016-10-23 MED ORDER — WARFARIN SODIUM 2.5 MG PO TABS
2.5000 mg | ORAL_TABLET | Freq: Once | ORAL | Status: AC
  Administered 2016-10-24: 2.5 mg via ORAL
  Filled 2016-10-23: qty 1

## 2016-10-23 MED ORDER — WARFARIN - PHARMACIST DOSING INPATIENT: Freq: Every day | Status: DC

## 2016-10-23 MED ORDER — SODIUM CHLORIDE 0.9 % IV SOLN: INTRAVENOUS | Status: AC

## 2016-10-23 MED ORDER — LEVOTHYROXINE SODIUM 50 MCG PO TABS
50.0000 ug | ORAL_TABLET | Freq: Every day | ORAL | Status: DC
  Administered 2016-10-24 – 2016-10-27 (×4): 50 ug via ORAL
  Filled 2016-10-23 (×4): qty 1

## 2016-10-23 MED ORDER — ACETAMINOPHEN 325 MG PO TABS: 650.0000 mg | ORAL_TABLET | Freq: Four times a day (QID) | ORAL | Status: DC | PRN

## 2016-10-23 MED ORDER — IPRATROPIUM-ALBUTEROL 0.5-2.5 (3) MG/3ML IN SOLN
3.0000 mL | Freq: Once | RESPIRATORY_TRACT | Status: AC
  Administered 2016-10-23: 3 mL via RESPIRATORY_TRACT
  Filled 2016-10-23: qty 3

## 2016-10-23 MED ORDER — ALBUTEROL SULFATE (2.5 MG/3ML) 0.083% IN NEBU
2.5000 mg | INHALATION_SOLUTION | RESPIRATORY_TRACT | Status: DC
  Administered 2016-10-23 (×2): 2.5 mg via RESPIRATORY_TRACT
  Filled 2016-10-23 (×2): qty 3

## 2016-10-23 MED ORDER — PANTOPRAZOLE SODIUM 40 MG PO TBEC
40.0000 mg | DELAYED_RELEASE_TABLET | Freq: Every day | ORAL | Status: DC
  Administered 2016-10-24 – 2016-10-27 (×4): 40 mg via ORAL
  Filled 2016-10-23 (×4): qty 1

## 2016-10-23 NOTE — ED Triage Notes (Signed)
Pt arrives via EMS from home with progressive weakness over the last several weeks. Today was found by family members in blood stained chair while eating breakfast. Denies pain. Pt on coumadin for afib.  Pt awake, alert, VSS. NAD at present.

## 2016-10-23 NOTE — ED Provider Notes (Signed)
[facility]-EMERGENCY DEPT Provider Note   CSN: [id number] Arrival date & time: 10/23/16  1110     History   Chief Complaint Chief Complaint  Patient presents with  . Weakness  . GI Bleeding    HPI Jordan [name] is a 83 y.o. male.  HPI   83 yo M with PMhx CHF, HTN, HLD, pAFIB, COPD here with multiple complaints. Pt reportedly has felt generally weak for the past 1-2 weeks. He has been c/o increasing SOB and has been coughing more than usual. He has known CAD but reportedly does not like to take his medications. Otherwise, pt presents this AM because he began coughing up a small amount of blood, as well as having bright red blood on his pants after he stood up from eating breakfast this AM. He has had no fever. Denies any abdominal pain. No h/o GIB per report, but is on cuomadin for his AFib.  Past Medical History:  Diagnosis Date  . Acute blood loss anemia   . CHF (congestive heart failure) (HCC)   . Chronic diastolic heart failure (HCC)   . CKD (chronic kidney disease) stage 3, GFR 30-59 ml/min   . Constipation   . COPD (chronic obstructive pulmonary disease) (HCC)   . Dyspnea   . Ectropion   . Family history of adverse reaction to anesthesia    " daughter has difficulty waking "  . GERD (gastroesophageal reflux disease)   . Hypertension   . Intertrochanteric fracture of left hip (HCC)   . Leukocytosis   . Paroxysmal atrial fibrillation (HCC)   . Physical deconditioning   . PVC (premature ventricular contraction)   . Thyroid disease   . Ventricular bigeminy     Patient Active Problem List   Diagnosis Date Noted  . Respiratory distress 10/23/2016  . BRBPR (bright red blood per rectum) 10/23/2016  . Paroxysmal atrial fibrillation (HCC)   . Closed compression fracture of L2 lumbar vertebra (HCC)/ ACUTE/SUBACUTE 07/18/2016  . Abdominal distention   . Low back pain   . AKI (acute kidney injury) (HCC) 07/16/2016  . Fecal impaction (HCC) 07/16/2016  . GERD  (gastroesophageal reflux disease) 07/16/2016  . Femur fracture, left (HCC) 05/07/2015  . Hip fracture (HCC) 05/06/2015  . Faintness 09/16/2013  . 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 Syncope 07/10/2013  . Pneumonia 05/23/2013  . Dysphagia 05/09/2013  . Acute blood loss anemia 05/08/2013  . Food impaction of esophagus 04/26/2013  . Closed right hip fracture (HCC) 04/24/2013  . Chronic kidney disease (CKD) stage G3a/A1 04/24/2013  . CAP (community acquired pneumonia) 04/24/2013  . Leukocytosis 04/24/2013  . Encounter for therapeutic drug monitoring 04/18/2013  . COPD (chronic obstructive pulmonary disease) (HCC) 01/01/2013  . Chronic anticoagulation 01/01/2013  . Chronic diastolic heart failure (HCC) 01/01/2013  . Diarrhea 01/01/2013  . Atrial fibrillation (HCC) 12/18/2012  . Bradycardia 12/16/2012  . Hypertension 12/16/2012  . Hypothyroidism 12/16/2012  . Acute kidney failure (HCC) 12/16/2012    Past Surgical History:  Procedure Laterality Date  . CHOLECYSTECTOMY    . ESOPHAGOGASTRODUODENOSCOPY N/A 04/26/2013   Procedure: ESOPHAGOGASTRODUODENOSCOPY (EGD);  Surgeon: Florencia Reasons, MD;  Location: [facility] ENDOSCOPY;  Service: Endoscopy;  Laterality: N/A;  . ESOPHAGOGASTRODUODENOSCOPY (EGD) WITH ESOPHAGEAL DILATION N/A 04/26/2013   Procedure: ESOPHAGOGASTRODUODENOSCOPY (EGD) WITH ESOPHAGEAL DILATION;  Surgeon: Florencia Reasons, MD;  Location: [facility] ENDOSCOPY;  Service: Endoscopy;  Laterality: N/A;  . HIP ARTHROPLASTY Right 04/25/2013   Procedure: ARTHROPLASTY BIPOLAR HIP;  Surgeon: Nestor Lewandowsky, MD;  Location:  [facility] OR;  Service: Orthopedics;  Laterality: Right;  . HIP ARTHROPLASTY Left 05/07/2015   Procedure: LEFT HEMI-HIP ARTHROPLASTY CEMENTED;  Surgeon: Gean Birchwood, MD;  Location: [facility] OR;  Service: Orthopedics;  Laterality: Left;  . SPLENECTOMY, PARTIAL         Home Medications    Prior to Admission medications   Medication Sig Start Date End Date Taking? Authorizing Provider  acetaminophen  (TYLENOL) 650 MG CR tablet Take 650-1,300 mg by mouth See admin instructions. 650mg  in am, 1300mg  in pm   Yes [provider]  albuterol (PROVENTIL HFA;VENTOLIN HFA) 108 (90 Base) MCG/ACT inhaler Inhale 2 puffs into the lungs every 6 (six) hours as needed for wheezing or shortness of breath.   Yes [provider]  latanoprost (XALATAN) 0.005 % ophthalmic solution Place 1 drop into both eyes at bedtime. For glaucoma   Yes [provider]  levothyroxine (SYNTHROID, LEVOTHROID) 50 MCG tablet Take 50 mcg by mouth daily before breakfast.   Yes [provider]  pantoprazole (PROTONIX) 40 MG tablet Take 1 tablet (40 mg total) by mouth daily. 05/11/15  Yes Ghimire, Werner Lean, MD  senna-docusate (SENOKOT-S) 8.6-50 MG tablet Take 1 tablet by mouth 2 (two) times daily. Patient taking differently: Take 1 tablet by mouth daily.  07/18/16  Yes Rodolph Bong, MD  warfarin (COUMADIN) 2.5 MG tablet Take as directed by coumadin clinic Patient taking differently: Take 2.5-3.75 mg by mouth daily. Take 2.5 mg on Sun / Tues / Thurs / Sat  Take 3.75 mg on  Mon / Wed / Fri 06/19/16  Yes Jake Bathe, MD    Family History Family History  Problem Relation Age of Onset  . Hypertension Father     Social History Social History  Substance Use Topics  . Smoking status: Former Games developer  . Smokeless tobacco: Never Used     Comment: quit smoking in 1990  . Alcohol use No     Allergies   Diltiazem hcl er beads and Metoprolol succinate er [metoprolol tartrate]   Review of Systems Review of Systems  Constitutional: Positive for fatigue. Negative for chills and fever.  HENT: Negative for congestion and rhinorrhea.   Eyes: Negative for visual disturbance.  Respiratory: Positive for cough, shortness of breath and wheezing.   Cardiovascular: Negative for chest pain and leg swelling.  Gastrointestinal: Positive for blood in stool. Negative for abdominal pain, diarrhea, nausea and  vomiting.  Genitourinary: Negative for dysuria and flank pain.  Musculoskeletal: Negative for neck pain and neck stiffness.  Skin: Negative for rash and wound.  Allergic/Immunologic: Negative for immunocompromised state.  Neurological: Positive for weakness and light-headedness. Negative for syncope and headaches.  All other systems reviewed and are negative.    Physical Exam Updated Vital Signs BP (!) 157/110   Pulse 75   Temp 98.7 F (37.1 C) (Oral)   Resp (!) 24   SpO2 98%   Physical Exam  Constitutional: He is oriented to person, place, and time. He appears well-developed and well-nourished. No distress.  Elderly, frail-appearing  HENT:  Head: Normocephalic and atraumatic.  Mild conjunctival injection bilaterally, with serous drainge  Eyes: Pupils are equal, round, and reactive to light.  Neck: Neck supple.  Cardiovascular: Normal rate, regular rhythm and normal heart sounds.  Exam reveals no friction rub.   No murmur heard. Pulmonary/Chest: Accessory muscle usage present. Tachypnea noted. No respiratory distress. He has decreased breath sounds in the right lower field and the left lower field. He  has wheezes. He has no rales.  Abdominal: He exhibits no distension.  Genitourinary:  Genitourinary Comments: Superficial skin breakdown of perineum, with small areas of superficial ulceration. No active bleeding btu sequelae of recent bleed noted. Stool has soft, brown stool in rectal vault with no bright red blood.  Musculoskeletal: He exhibits no edema.  Neurological: He is alert and oriented to person, place, and time. He exhibits normal muscle tone.  Skin: Skin is warm. Capillary refill takes less than 2 seconds.  Psychiatric: He has a normal mood and affect.  Nursing note and vitals reviewed.    ED Treatments / Results  Labs (all labs ordered are listed, but only abnormal results are displayed) Labs Reviewed  CBC WITH DIFFERENTIAL/PLATELET - Abnormal; Notable for the  following:       Result Value   RBC 4.03 (*)    All other components within normal limits  COMPREHENSIVE METABOLIC PANEL - Abnormal; Notable for the following:    Chloride 112 (*)    CO2 21 (*)    Glucose, Bld 115 (*)    BUN 31 (*)    Creatinine, Ser 1.85 (*)    Calcium 8.4 (*)    Total Protein 5.8 (*)    Albumin 3.3 (*)    ALT 11 (*)    GFR calc non Af Amer 29 (*)    GFR calc Af Amer 34 (*)    All other components within normal limits  PROTIME-INR - Abnormal; Notable for the following:    Prothrombin Time 34.3 (*)    All other components within normal limits  BRAIN NATRIURETIC PEPTIDE - Abnormal; Notable for the following:    B Natriuretic Peptide 125.1 (*)    All other components within normal limits  URINALYSIS, ROUTINE W REFLEX MICROSCOPIC - Abnormal; Notable for the following:    Protein, ur 30 (*)    All other components within normal limits  I-STAT CG4 LACTIC ACID, ED - Abnormal; Notable for the following:    Lactic Acid, Venous 2.26 (*)    All other components within normal limits  I-STAT CHEM 8, ED - Abnormal; Notable for the following:    BUN 40 (*)    Creatinine, Ser 1.60 (*)    All other components within normal limits  CBC  VITAMIN B12  FOLATE RBC  POC OCCULT BLOOD, ED  I-STAT TROPONIN, ED  I-STAT CG4 LACTIC ACID, ED  TYPE AND SCREEN    EKG  EKG Interpretation  Date/Time:  Monday October 23 2016 11:20:09 EDT Ventricular Rate:  86 PR Interval:    QRS Duration: 97 QT Interval:  394 QTC Calculation: 461 R Axis:   86 Text Interpretation:  Sinus rhythm Multiple premature complexes, vent & supraven Borderline right axis deviation Low voltage, precordial leads No significant change since last tracing Confirmed by Shaune Pollack [phone]) on 10/23/2016 12:08:56 PM       Radiology Dg Chest Portable 1 View  Result Date: 10/23/2016 CLINICAL DATA:  Progressive weakness over last several weeks, today found by members in a blood-stained chair while eating  breakfast, history COPD, hypertension, GERD, CHF EXAM: PORTABLE CHEST 1 VIEW COMPARISON:  Portable exam 1216 hours compared to 07/16/2016 FINDINGS: Normal heart size, mediastinal contours, and pulmonary vascularity. Chronic interstitial changes at lung bases greater on RIGHT, stable. Underlying emphysematous changes. No acute infiltrate, pleural effusion or pneumothorax. Bones demineralized. IMPRESSION: Emphysematous and basilar chronic interstitial changes. No acute abnormalities. Electronically Signed   By: Angelyn Punt.D.  On: 10/23/2016 12:26    Procedures Procedures (including critical care time)  Medications Ordered in ED Medications  senna-docusate (Senokot-S) tablet 1 tablet (not administered)  levothyroxine (SYNTHROID, LEVOTHROID) tablet 50 mcg (not administered)  pantoprazole (PROTONIX) EC tablet 40 mg (not administered)  latanoprost (XALATAN) 0.005 % ophthalmic solution 1 drop (not administered)  0.9 %  sodium chloride infusion (not administered)  acetaminophen (TYLENOL) tablet 650 mg (not administered)    Or  acetaminophen (TYLENOL) suppository 650 mg (not administered)  albuterol (PROVENTIL) (2.5 MG/3ML) 0.083% nebulizer solution 2.5 mg (2.5 mg Nebulization Given 10/23/16 1613)  albuterol (PROVENTIL) (2.5 MG/3ML) 0.083% nebulizer solution 2.5 mg (not administered)  guaiFENesin (MUCINEX) 12 hr tablet 600 mg (not administered)  methylPREDNISolone sodium succinate (SOLU-MEDROL) 125 mg/2 mL injection 60 mg (not administered)  ipratropium-albuterol (DUONEB) 0.5-2.5 (3) MG/3ML nebulizer solution 3 mL (3 mLs Nebulization Given 10/23/16 1350)  methylPREDNISolone sodium succinate (SOLU-MEDROL) 125 mg/2 mL injection 125 mg (125 mg Intravenous Given 10/23/16 1345)     Initial Impression / Assessment and Plan / ED Course  I have reviewed the triage vital signs and the nursing notes.  Pertinent labs & imaging results that were available during my care of the patient were reviewed by me and  considered in my medical decision making (see chart for details).     83 yo M with PMHx as above here with generalized weakness, SOB, and possible GI bleed. I suspect pt likely has acute COPD versus CHF exacerbation, causing generalized SOB and weakness. Suspect he has been sitting down/immobile and subsequently ahs worsening of his skin/perineal breakdown with bleeding in setting of coumadin use. His stool is brown, hemoccult neg and I do not suspect active GIB. He remains tachypneic in ED and has been given solumedrol, duoneb with improvement. Labs are o/w reassuring.   Will admit for COPD exacerbation/SOB.  Final Clinical Impressions(s) / ED Diagnoses   Final diagnoses:  COPD exacerbation (HCC)  Supratherapeutic INR  Skin breakdown    New Prescriptions New Prescriptions   No medications on file     Shaune PollackIsaacs, Evon Dejarnett, MD 10/23/16 [phone]

## 2016-10-23 NOTE — ED Notes (Signed)
Pt given [place] sandwich and sprite for lunch

## 2016-10-23 NOTE — Progress Notes (Signed)
ANTICOAGULATION CONSULT NOTE - Initial Consult  Pharmacy Consult for warfarin Indication: atrial fibrillation  Allergies  Allergen Reactions  . Diltiazem Hcl Er Beads Other (See Comments)    Tachycardia  . Metoprolol Succinate Er [Metoprolol Tartrate] Other (See Comments)    hallucinations     Vital Signs: Temp: 98.7 F (37.1 C) (08/13 1121) Temp Source: Oral (08/13 1121) BP: 141/82 (08/13 1800) Pulse Rate: 75 (08/13 1121)  Labs:  Recent Labs  10/23/16 1200 10/23/16 1502  HGB 13.7 13.3  HCT 39.5 39.0  PLT 204  --   LABPROT 34.3*  --   INR 3.30  --   CREATININE 1.85* 1.60*    CrCl cannot be calculated (Unknown ideal weight.).   Medical History: Past Medical History:  Diagnosis Date  . Acute blood loss anemia   . CHF (congestive heart failure) (HCC)   . Chronic diastolic heart failure (HCC)   . CKD (chronic kidney disease) stage 3, GFR 30-59 ml/min   . Constipation   . COPD (chronic obstructive pulmonary disease) (HCC)   . Dyspnea   . Ectropion   . Family history of adverse reaction to anesthesia    " daughter has difficulty waking "  . GERD (gastroesophageal reflux disease)   . Hypertension   . Intertrochanteric fracture of left hip (HCC)   . Leukocytosis   . Paroxysmal atrial fibrillation (HCC)   . Physical deconditioning   . PVC (premature ventricular contraction)   . Thyroid disease   . Ventricular bigeminy     Medications:  Scheduled:  . albuterol  2.5 mg Nebulization Q4H  . guaiFENesin  600 mg Oral BID  . latanoprost  1 drop Both Eyes QHS  . [START ON 10/24/2016] levothyroxine  50 mcg Oral QAC breakfast  . methylPREDNISolone (SOLU-MEDROL) injection  60 mg Intravenous Q12H  . pantoprazole  40 mg Oral Daily  . [START ON 10/24/2016] senna-docusate  1 tablet Oral Daily  . [START ON 10/24/2016] warfarin  2.5 mg Oral ONCE-1800  . [START ON 10/24/2016] Warfarin - Pharmacist Dosing Inpatient   Does not apply q1800    Assessment: 83 y/o male on  warfarin at home for Afib presented to ED with SOB and complaints of bright red blood in underwear. In ED, FOBT negative and CBC stable and within normal limits, low suspicion for active GI bleed. Bleeding likely due to worsening skin/perianal breakdown from immobility per ED notes. INR 3.3 supratherapeutic on admission. Last warfarin dose taken 8/12, one day prior to presentation.  PTA warfarin dose: 2.5 mg daily except 3.75 mg on MWF   Goal of Therapy:  INR 2-3 Monitor platelets by anticoagulation protocol: Yes   Plan:  Warfarin 2.5 mg PO x 1 Daily INR, CBC Monitor s/sx of bleeding   Al CorpusLindsey Sarajean Dessert, PharmD PGY1 [facility] Resident Pager: [phone] 10/23/2016,6:14 PM

## 2016-10-23 NOTE — H&P (Signed)
History and Physical    NAVARRE DIANA RUE:[id number] DOB: [birth year] DOA: 10/23/2016  PCP: Kaleen Mask, MD Patient coming from: home  Chief Complaint: generalized weakness/BRBPR  HPI: Jordan [name] is a very pleasant somewhat HOH 83 y.o. male with medical history significant A. fib on, hypertension, hypothyroidism, COPD not on home oxygen and noncompliant with inhalers, chronic diastolic heart failure presents to the emergency department with chief complaint bright red blood per rectum and generalized weakness. Workup in the emergency department reveals some respiratory distress likely related to COPD exacerbation and a negative FOBT. [facility] asked to admit for COPD exacerbation.  Information is obtained from the patient and his daughter who is at the bedside and caregiver. She states this morning when patient awakened moderate to large amount of blood noted in patient's underpants and in his bed linen. She denies seeing any clots she notes that this was dried. She is unsure if he has hemorrhoids bolus concern because he is on Coumadin. Associated symptoms include generalized weakness decreased oral intake. No reports of any abdominal pain nausea vomiting diarrhea constipation. No reports of melena. Patient denies any headache dizziness syncope or near-syncope. He denies abdominal pain. He does endorse some worsening shortness of breath but reports he is breathing better since" I got that breathing treatment and quotations. Daughter reports his current respiratory status is baseline and he "always has wheezes". She also reports he is noncompliant with his inhaler and acknowledges he has "confusion" over the last 6 months and she helps him with his meds but does not present him with his inhaler. He denies chest pain palpitations lower extremity edema.  ED Course: Emergency department he is afebrile hemodynamically stable and not hypoxic. His respiratory rate is 31 somewhat shallow he has  audible wheezing he is provided with DuoNeb's and under 25 mg Solu-Medrol in the emergency department. He reports breathing easier at the time of admission  Review of Systems: As per HPI otherwise all other systems reviewed and are negative.   Ambulatory Status: Patient is minimal ambulation at home. Sits in chair most of time gait unsteady uses a walker on occasion  Past Medical History:  Diagnosis Date  . Acute blood loss anemia   . CHF (congestive heart failure) (HCC)   . Chronic diastolic heart failure (HCC)   . CKD (chronic kidney disease) stage 3, GFR 30-59 ml/min   . Constipation   . COPD (chronic obstructive pulmonary disease) (HCC)   . Dyspnea   . Ectropion   . Family history of adverse reaction to anesthesia    " daughter has difficulty waking "  . GERD (gastroesophageal reflux disease)   . Hypertension   . Intertrochanteric fracture of left hip (HCC)   . Leukocytosis   . Paroxysmal atrial fibrillation (HCC)   . Physical deconditioning   . PVC (premature ventricular contraction)   . Thyroid disease   . Ventricular bigeminy     Past Surgical History:  Procedure Laterality Date  . CHOLECYSTECTOMY    . ESOPHAGOGASTRODUODENOSCOPY N/A 04/26/2013   Procedure: ESOPHAGOGASTRODUODENOSCOPY (EGD);  Surgeon: Florencia Reasons, MD;  Location: [facility] ENDOSCOPY;  Service: Endoscopy;  Laterality: N/A;  . ESOPHAGOGASTRODUODENOSCOPY (EGD) WITH ESOPHAGEAL DILATION N/A 04/26/2013   Procedure: ESOPHAGOGASTRODUODENOSCOPY (EGD) WITH ESOPHAGEAL DILATION;  Surgeon: Florencia Reasons, MD;  Location: [facility] ENDOSCOPY;  Service: Endoscopy;  Laterality: N/A;  . HIP ARTHROPLASTY Right 04/25/2013   Procedure: ARTHROPLASTY BIPOLAR HIP;  Surgeon: Nestor Lewandowsky, MD;  Location: [facility] OR;  Service: Orthopedics;  Laterality: Right;  . HIP ARTHROPLASTY Left 05/07/2015   Procedure: LEFT HEMI-HIP ARTHROPLASTY CEMENTED;  Surgeon: Gean Birchwood, MD;  Location: [facility] OR;  Service: Orthopedics;  Laterality: Left;  . SPLENECTOMY,  PARTIAL      Social History   Social History  . Marital status: Widowed    Spouse name: N/A  . Number of children: N/A  . Years of education: N/A   Occupational History  . Not on file.   Social History Main Topics  . Smoking status: Former Games developer  . Smokeless tobacco: Never Used     Comment: quit smoking in 1990  . Alcohol use No  . Drug use: No  . Sexual activity: No   Other Topics Concern  . Not on file   Social History Narrative  . No narrative on file    Allergies  Allergen Reactions  . Diltiazem Hcl Er Beads Other (See Comments)    Tachycardia  . Metoprolol Succinate Er [Metoprolol Tartrate] Other (See Comments)    hallucinations    Family History  Problem Relation Age of Onset  . Hypertension Father     Prior to Admission medications   Medication Sig Start Date End Date Taking? Authorizing Provider  acetaminophen (TYLENOL) 650 MG CR tablet Take 650-1,300 mg by mouth See admin instructions. 650mg  in am, 1300mg  in pm   Yes [provider]  albuterol (PROVENTIL HFA;VENTOLIN HFA) 108 (90 Base) MCG/ACT inhaler Inhale 2 puffs into the lungs every 6 (six) hours as needed for wheezing or shortness of breath.   Yes [provider]  latanoprost (XALATAN) 0.005 % ophthalmic solution Place 1 drop into both eyes at bedtime. For glaucoma   Yes [provider]  levothyroxine (SYNTHROID, LEVOTHROID) 50 MCG tablet Take 50 mcg by mouth daily before breakfast.   Yes [provider]  pantoprazole (PROTONIX) 40 MG tablet Take 1 tablet (40 mg total) by mouth daily. 05/11/15  Yes Ghimire, Werner Lean, MD  senna-docusate (SENOKOT-S) 8.6-50 MG tablet Take 1 tablet by mouth 2 (two) times daily. Patient taking differently: Take 1 tablet by mouth daily.  07/18/16  Yes Rodolph Bong, MD  warfarin (COUMADIN) 2.5 MG tablet Take as directed by coumadin clinic Patient taking differently: Take 2.5-3.75 mg by mouth daily. Take 2.5 mg on Sun / Tues / Thurs /  Sat  Take 3.75 mg on  Mon / Wed / Fri 06/19/16  Yes Jake Bathe, MD    Physical Exam: Vitals:   10/23/16 1121 10/23/16 1200  BP: 128/72 123/64  Pulse: 75   Resp: (!) 31 (!) 21  Temp: 98.7 F (37.1 C)   TempSrc: Oral   SpO2: 98%      General:  Appears calm and comfortable, in no acute distress Eyes:  PERRL, EOMI, normal lids, iris ENT:  grossly normal hearing, lips & tongue, mucous membranes of his mouth are pink slightly dry Neck:  no LAD, masses or thyromegaly Cardiovascular:  RRR, no m/r/g. No LE edema.  Respiratory:  Mild increased work of breathing with conversation. Breath sounds are diminished throughout I hear audible wheezing. Per auscultation wheezes inspiration and expiration throughout hear no crackles Abdomen:  soft, ntnd, positive bowel sounds no guarding or rebounding Skin:  no rash or induration seen on limited exam Musculoskeletal:  grossly normal tone BUE/BLE, good ROM, no bony abnormality Psychiatric:  grossly normal mood and affect, speech fluent and appropriate, AOx3 Neurologic:  CN 2-12 grossly intact, moves all extremities in coordinated fashion, sensation intact  alert oriented to self and place only. Follows commands. Moves all extremities spontaneously.  Labs on Admission: I have personally reviewed following labs and imaging studies  CBC:  Recent Labs Lab 10/23/16 1200  WBC 9.3  NEUTROABS 5.1  HGB 13.7  HCT 39.5  MCV 98.0  PLT 204   Basic Metabolic Panel:  Recent Labs Lab 10/23/16 1200  NA 141  K 4.0  CL 112*  CO2 21*  GLUCOSE 115*  BUN 31*  CREATININE 1.85*  CALCIUM 8.4*   GFR: CrCl cannot be calculated (Unknown ideal weight.). Liver Function Tests:  Recent Labs Lab 10/23/16 1200  AST 21  ALT 11*  ALKPHOS 106  BILITOT 0.6  PROT 5.8*  ALBUMIN 3.3*   No results for input(s): LIPASE, AMYLASE in the last 168 hours. No results for input(s): AMMONIA in the last 168 hours. Coagulation Profile:  Recent Labs Lab  10/23/16 1200  INR 3.30   Cardiac Enzymes: No results for input(s): CKTOTAL, CKMB, CKMBINDEX, TROPONINI in the last 168 hours. BNP (last 3 results) No results for input(s): PROBNP in the last 8760 hours. HbA1C: No results for input(s): HGBA1C in the last 72 hours. CBG: No results for input(s): GLUCAP in the last 168 hours. Lipid Profile: No results for input(s): CHOL, HDL, LDLCALC, TRIG, CHOLHDL, LDLDIRECT in the last 72 hours. Thyroid Function Tests: No results for input(s): TSH, T4TOTAL, FREET4, T3FREE, THYROIDAB in the last 72 hours. Anemia Panel: No results for input(s): VITAMINB12, FOLATE, FERRITIN, TIBC, IRON, RETICCTPCT in the last 72 hours. Urine analysis:    Component Value Date/Time   COLORURINE YELLOW 10/23/2016 1344   APPEARANCEUR CLEAR 10/23/2016 1344   LABSPEC 1.027 10/23/2016 1344   PHURINE 5.0 10/23/2016 1344   GLUCOSEU NEGATIVE 10/23/2016 1344   HGBUR NEGATIVE 10/23/2016 1344   BILIRUBINUR NEGATIVE 10/23/2016 1344   KETONESUR NEGATIVE 10/23/2016 1344   PROTEINUR 30 (A) 10/23/2016 1344   UROBILINOGEN 0.2 07/10/2013 1520   NITRITE NEGATIVE 10/23/2016 1344   LEUKOCYTESUR NEGATIVE 10/23/2016 1344    Creatinine Clearance: CrCl cannot be calculated (Unknown ideal weight.).  Sepsis Labs: @LABRCNTIP (procalcitonin:4,lacticidven:4) )No results found for this or any previous visit (from the past 240 hour(s)).   Radiological Exams on Admission: Dg Chest Portable 1 View  Result Date: 10/23/2016 CLINICAL DATA:  Progressive weakness over last several weeks, today found by members in a blood-stained chair while eating breakfast, history COPD, hypertension, GERD, CHF EXAM: PORTABLE CHEST 1 VIEW COMPARISON:  Portable exam 1216 hours compared to 07/16/2016 FINDINGS: Normal heart size, mediastinal contours, and pulmonary vascularity. Chronic interstitial changes at lung bases greater on RIGHT, stable. Underlying emphysematous changes. No acute infiltrate, pleural effusion or  pneumothorax. Bones demineralized. IMPRESSION: Emphysematous and basilar chronic interstitial changes. No acute abnormalities. Electronically Signed   By: Ulyses Southward M.D.   On: 10/23/2016 12:26    EKG: Independently reviewed. Sinus rhythm Multiple premature complexes, vent & supraven Borderline right axis deviation Low voltage, precordial leads No significant change since last tracing  Assessment/Plan Principal Problem:   Respiratory distress Active Problems:   Hypertension   Hypothyroidism   Atrial fibrillation (HCC)   COPD (chronic obstructive pulmonary disease) (HCC)   Chronic anticoagulation   Chronic diastolic heart failure (HCC)   Chronic kidney disease (CKD) stage G3a/A1   Dysphagia   GERD (gastroesophageal reflux disease)   BRBPR (bright red blood per rectum)   #1. Respiratory distress/COPD exacerbation. Patient with a history of COPD and noncompliance with his inhalers. Chest x-ray Emphysematous and basilar chronic  interstitial changes. No acute abnormalities. BNP 125. Oxygen saturation level greater than 90% on room air. His story rate is in the 30s he does have audible wheezing. He received Solu-Medrol and duo nebs in the emergency department -Admit -Scheduled nebulizers -Continue Solu-Medrol -Flutter valve -anti-tussive -monitor  #2. Bright red blood per rectum. Etiology uncertain. Recent history of fecal impaction. FOBT negative. Hemoglobin stable. Patient is on Coumadin. Hemoglobin 13 on admission -Serial hemoglobin -Colace -Repeat FOBT -Outpatient follow-up if hemoglobin remains stable  #3. A. fib. [place] score 4. EKG as noted above. INR 3.3. Chart review indicates when patient discharged 3 months ago he was on Coreg. Home medications list no weight controlling agents. He is on Coumadin -Continue Coumadin per pharmacy -Monitor   #4. Chronic kidney disease. Creatinine 1.8 on admission. Chart review indicates this is close to his baseline. -Very gentle IV  fluids -Hold nephrotoxins -Monitor urine output -Recheck in the morning  #5. Chronic diastolic heart failure. Compensated. home medications include hydrochlorothiazide however this was changed 3 months ago at discharge. -Daily weights -Intake and output -Hold hydrochlorothiazide -Monitor  #6. Abnormal CT of the chest. 2 months ago CT of the chest done while hospitalized showed an irregular nodular focus of consolidation in the right lower lobe likely inflammatory. Recommendation at that time repeat chest CT in 3 months for follow-up -consider Chest CT  DVT prophylaxis: coumadin  Code Status: full  Family Communication: daughter at bedside  Disposition Plan: may benefit placement  Consults called: none  Admission status: obs    Gwenyth BenderBLACK,Renee Erb M MD [facility]  If 7PM-7AM, please contact night-coverage [email or web address] Password [email or web address]  10/23/2016, 2:41 PM

## 2016-10-24 ENCOUNTER — Encounter (HOSPITAL_COMMUNITY): Payer: Self-pay | Admitting: General Practice

## 2016-10-24 DIAGNOSIS — R531 Weakness: Secondary | ICD-10-CM | POA: Diagnosis present

## 2016-10-24 DIAGNOSIS — I4891 Unspecified atrial fibrillation: Secondary | ICD-10-CM | POA: Diagnosis not present

## 2016-10-24 DIAGNOSIS — N184 Chronic kidney disease, stage 4 (severe): Secondary | ICD-10-CM

## 2016-10-24 DIAGNOSIS — Z79899 Other long term (current) drug therapy: Secondary | ICD-10-CM | POA: Diagnosis not present

## 2016-10-24 DIAGNOSIS — Z7901 Long term (current) use of anticoagulants: Secondary | ICD-10-CM | POA: Diagnosis not present

## 2016-10-24 DIAGNOSIS — Z8249 Family history of ischemic heart disease and other diseases of the circulatory system: Secondary | ICD-10-CM | POA: Diagnosis not present

## 2016-10-24 DIAGNOSIS — R791 Abnormal coagulation profile: Secondary | ICD-10-CM

## 2016-10-24 DIAGNOSIS — K648 Other hemorrhoids: Secondary | ICD-10-CM | POA: Diagnosis present

## 2016-10-24 DIAGNOSIS — E039 Hypothyroidism, unspecified: Secondary | ICD-10-CM | POA: Diagnosis present

## 2016-10-24 DIAGNOSIS — I480 Paroxysmal atrial fibrillation: Secondary | ICD-10-CM | POA: Diagnosis present

## 2016-10-24 DIAGNOSIS — I493 Ventricular premature depolarization: Secondary | ICD-10-CM | POA: Diagnosis not present

## 2016-10-24 DIAGNOSIS — I959 Hypotension, unspecified: Secondary | ICD-10-CM | POA: Diagnosis present

## 2016-10-24 DIAGNOSIS — K625 Hemorrhage of anus and rectum: Secondary | ICD-10-CM | POA: Diagnosis not present

## 2016-10-24 DIAGNOSIS — Z96643 Presence of artificial hip joint, bilateral: Secondary | ICD-10-CM | POA: Diagnosis present

## 2016-10-24 DIAGNOSIS — K219 Gastro-esophageal reflux disease without esophagitis: Secondary | ICD-10-CM | POA: Diagnosis present

## 2016-10-24 DIAGNOSIS — J441 Chronic obstructive pulmonary disease with (acute) exacerbation: Secondary | ICD-10-CM | POA: Diagnosis present

## 2016-10-24 DIAGNOSIS — Z9119 Patient's noncompliance with other medical treatment and regimen: Secondary | ICD-10-CM | POA: Diagnosis not present

## 2016-10-24 DIAGNOSIS — Z87891 Personal history of nicotine dependence: Secondary | ICD-10-CM | POA: Diagnosis not present

## 2016-10-24 DIAGNOSIS — Z888 Allergy status to other drugs, medicaments and biological substances status: Secondary | ICD-10-CM | POA: Diagnosis not present

## 2016-10-24 DIAGNOSIS — R1310 Dysphagia, unspecified: Secondary | ICD-10-CM | POA: Diagnosis present

## 2016-10-24 DIAGNOSIS — I5032 Chronic diastolic (congestive) heart failure: Secondary | ICD-10-CM | POA: Diagnosis present

## 2016-10-24 DIAGNOSIS — L909 Atrophic disorder of skin, unspecified: Secondary | ICD-10-CM

## 2016-10-24 DIAGNOSIS — I130 Hypertensive heart and chronic kidney disease with heart failure and stage 1 through stage 4 chronic kidney disease, or unspecified chronic kidney disease: Secondary | ICD-10-CM | POA: Diagnosis present

## 2016-10-24 DIAGNOSIS — R0603 Acute respiratory distress: Secondary | ICD-10-CM | POA: Diagnosis present

## 2016-10-24 LAB — CBC
HEMATOCRIT: 37.7 % — AB (ref 39.0–52.0)
HEMOGLOBIN: 13 g/dL (ref 13.0–17.0)
MCH: 33 pg (ref 26.0–34.0)
MCHC: 34.5 g/dL (ref 30.0–36.0)
MCV: 95.7 fL (ref 78.0–100.0)
Platelets: 220 10*3/uL (ref 150–400)
RBC: 3.94 MIL/uL — ABNORMAL LOW (ref 4.22–5.81)
RDW: 13.4 % (ref 11.5–15.5)
WBC: 10.7 10*3/uL — AB (ref 4.0–10.5)

## 2016-10-24 LAB — FOLATE RBC
FOLATE, RBC: 623 ng/mL (ref >498)
Folate, Hemolysate: 257.1 ng/mL
Hematocrit: 41.3 % (ref 37.5–51.0)

## 2016-10-24 LAB — PROTIME-INR
INR: 3.15
Prothrombin Time: 33.1 seconds — ABNORMAL HIGH (ref 11.4–15.2)

## 2016-10-24 LAB — BASIC METABOLIC PANEL
ANION GAP: 8 (ref 5–15)
BUN: 29 mg/dL — AB (ref 6–20)
CHLORIDE: 111 mmol/L (ref 101–111)
CO2: 23 mmol/L (ref 22–32)
Calcium: 8.7 mg/dL — ABNORMAL LOW (ref 8.9–10.3)
Creatinine, Ser: 1.86 mg/dL — ABNORMAL HIGH (ref 0.61–1.24)
GFR calc Af Amer: 34 mL/min — ABNORMAL LOW (ref >60)
GFR, EST NON AFRICAN AMERICAN: 29 mL/min — AB (ref >60)
Glucose, Bld: 150 mg/dL — ABNORMAL HIGH (ref 65–99)
POTASSIUM: 4.2 mmol/L (ref 3.5–5.1)
SODIUM: 142 mmol/L (ref 135–145)

## 2016-10-24 NOTE — Progress Notes (Signed)
ANTICOAGULATION CONSULT NOTE   Pharmacy Consult for warfarin Indication: atrial fibrillation  Allergies  Allergen Reactions  . Diltiazem Hcl Er Beads Other (See Comments)    Tachycardia  . Metoprolol Succinate Er [Metoprolol Tartrate] Other (See Comments)    hallucinations     Vital Signs: Temp: 98.6 F (37 C) (08/14 0845) Temp Source: Oral (08/14 0845) BP: 132/63 (08/14 0845) Pulse Rate: 88 (08/14 0845)  Labs:  Recent Labs  10/23/16 1200 10/23/16 1502 10/23/16 2156 10/24/16 0229  HGB 13.7 13.3 13.2 13.0  HCT 39.5 39.0 38.3* 37.7*  PLT 204  --  218 220  LABPROT 34.3*  --   --  33.1*  INR 3.30  --   --  3.15  CREATININE 1.85* 1.60*  --  1.86*    Estimated Creatinine Clearance: 22.5 mL/min (A) (by C-G formula based on SCr of 1.86 mg/dL (H)).   Medical History: Past Medical History:  Diagnosis Date  . Acute blood loss anemia   . CHF (congestive heart failure) (HCC)   . Chronic diastolic heart failure (HCC)   . CKD (chronic kidney disease) stage 3, GFR 30-59 ml/min   . Constipation   . COPD (chronic obstructive pulmonary disease) (HCC)   . Dyspnea   . Ectropion   . Family history of adverse reaction to anesthesia    " daughter has difficulty waking "  . GERD (gastroesophageal reflux disease)   . Hypertension   . Intertrochanteric fracture of left hip (HCC)   . Leukocytosis   . Paroxysmal atrial fibrillation (HCC)   . Physical deconditioning   . PVC (premature ventricular contraction)   . Thyroid disease   . Ventricular bigeminy     Medications:  Scheduled:  . guaiFENesin  600 mg Oral BID  . latanoprost  1 drop Both Eyes QHS  . levothyroxine  50 mcg Oral QAC breakfast  . methylPREDNISolone (SOLU-MEDROL) injection  60 mg Intravenous Q12H  . pantoprazole  40 mg Oral Daily  . senna-docusate  1 tablet Oral Daily  . warfarin  2.5 mg Oral ONCE-1800  . Warfarin - Pharmacist Dosing Inpatient   Does not apply q1800    Assessment: 83 y/o male on warfarin  at home for Afib presented to ED with SOB and complaints ofBRBPR. In ED, FOBT negative and CBC stable and within normal limits, low suspicion for active GI bleed. Per notes- likely internal hemorrhoids. -INR= 3.15 (coumadin dose was misses 8/13) anticipate continued INR trend down  PTA warfarin dose: 2.5 mg daily except 3.75 mg on MWF   Goal of Therapy:  INR 2-3 Monitor platelets by anticoagulation protocol: Yes   Plan:  Warfarin 2.5 mg PO x 1 Daily INR, CBC  Harland GermanAndrew Jola Critzer, Pharm D 10/24/2016 12:01 PM

## 2016-10-24 NOTE — Plan of Care (Signed)
Problem: Safety: Goal: Ability to remain free from injury will improve High fall risk protocol implemented, daughter is by the bedside, patient in safety mitts  Problem: Skin Integrity: Goal: Risk for impaired skin integrity will decrease Patient refused condom catheter, peri care provided and linens changed. Protective skin barrier applied   Problem: Activity: Goal: Risk for activity intolerance will decrease Patient confused and continually trying to get out of bed, has limited mobility

## 2016-10-24 NOTE — Evaluation (Addendum)
Physical Therapy Evaluation Patient Details Name: Jordan [name] MRN: [id number] DOB: [birth year] Today's Date: 10/24/2016   History of Present Illness  Patient is a 83 y/o male who presents with respiratory distress secondary to COPD exacerbation in the setting of general physical deconditioning due to age; also with blood from rectum. PMH includes PAF on Coumadin, hypothyroidism, CHF, CKD, chronic back pain, dementia, s/p rt THA 2017.  Clinical Impression  Patient presents with generalized weakness, deconditioning, impaired balance, baseline cognitive deficits (poor problem solving, sequencing), HOH and impaired mobility s/p above. Tolerated taking a few steps to get to chair with min A for balance/safety. Pt lives with daughter and goes to senior center during the day. Uses RW for ambulation. Pt high fall risk. Requires multimodal cues during session due to cognitive deficits and HOH. Would benefit from SNF to maximize independence and mobility prior to return home. Will follow acutely.    Follow Up Recommendations SNF;Supervision for mobility/OOB    Equipment Recommendations  None recommended by PT    Recommendations for Other Services OT consult     Precautions / Restrictions Precautions Precautions: Fall Restrictions Weight Bearing Restrictions: No      Mobility  Bed Mobility Overal bed mobility: Needs Assistance Bed Mobility: Rolling;Sidelying to Sit Rolling: Min guard Sidelying to sit: Min assist;HOB elevated       General bed mobility comments: Step by step cues tog et to EOB, difficulty sequencing despite cues, assist to get LLE off bed, increased time.   Transfers Overall transfer level: Needs assistance Equipment used: Rolling walker (2 wheeled) Transfers: Sit to/from [employer] Sit to Stand: Min assist Stand pivot transfers: Min assist       General transfer comment: Assist to power to standing with increased time and posterior lean. Bil  knees trembling upon standing. Min A for balance. Able to take a few steps tog et to chair with posterior lean and narrow BoS. Min A for balance and RW navigation. + wheezxing (daughter reports as baseline).  Ambulation/Gait                Stairs            Wheelchair Mobility    Modified Rankin (Stroke Patients Only)       Balance Overall balance assessment: Needs assistance Sitting-balance support: Feet supported;Single extremity supported Sitting balance-Leahy Scale: Fair Sitting balance - Comments: Able to sit EOB with UE support.   Standing balance support: During functional activity;Bilateral upper extremity supported Standing balance-Leahy Scale: Poor Standing balance comment: Reliant on BUEs for support in standing and Min A due to posterior lean.                              Pertinent Vitals/Pain Pain Assessment: Faces Faces Pain Scale: Hurts a little bit Pain Location: back- chronic Pain Descriptors / Indicators: Sore Pain Intervention(s): Monitored during session;Repositioned    Home Living Family/patient expects to be discharged to:: Unsure Living Arrangements: Children Available Help at Discharge: Family;Available PRN/intermittently (daughter present at night) Type of Home: House Home Access: Stairs to enter Entrance Stairs-Rails: Lawyer of Steps: 2 Home Layout: Two level;Able to live on main level with bedroom/bathroom Home Equipment: Dan Humphreys - 2 wheels;Toilet riser      Prior Function Level of Independence: Independent with assistive device(s)         Comments: Pt performing ADLs and was spending his day time hours at the senior  center. Currently, sponge baths due to difficulty getting in and out of tub     Hand Dominance   Dominant Hand: Right    Extremity/Trunk Assessment   Upper Extremity Assessment Upper Extremity Assessment: Defer to OT evaluation    Lower Extremity Assessment Lower  Extremity Assessment: Generalized weakness    Cervical / Trunk Assessment Cervical / Trunk Assessment: Kyphotic  Communication   Communication: HOH  Cognition Arousal/Alertness: Awake/alert Behavior During Therapy: WFL for tasks assessed/performed Overall Cognitive Status: History of cognitive impairments - at baseline Area of Impairment: Orientation;Safety/judgement;Awareness;Problem solving;Following commands                 Orientation Level:  (at church)     Following Commands: Follows one step commands inconsistently;Follows one step commands with increased time (multimodal cues) Safety/Judgement: Decreased awareness of safety;Decreased awareness of deficits Awareness: Intellectual (pre intellectual) Problem Solving: Slow processing;Decreased initiation;Difficulty sequencing;Requires verbal cues;Requires tactile cues General Comments: Per daughter, pt at cognitive baseline. Very HOH. Difficulty sequencing movements, due to [facility], responds well to demonstrational cues. Pt continues to talk about being in the [employer] Compan[employer]il[employer].       General Comments General comments (skin integrity, edema, etc.): Daughter present during session and provided all PLOF/history.     Exercises     Assessment/Plan    PT Assessment Patient needs continued PT services  PT Problem List Decreased strength;Decreased mobility;Decreased safety awareness;Decreased activity tolerance;Decreased cognition;Pain;Decreased balance       PT Treatment Interventions Therapeutic activities;Gait training;Therapeutic exercise;Patient/family education;Functional mobility training;Stair training;Balance training    PT Goals (Current goals can be found in the Care Plan section)  Acute Rehab PT Goals Patient Stated Goal: none stated PT Goal Formulation: Patient unable to participate in goal setting Time For Goal Achievement: 11/07/16 Potential to Achieve Goals: Fair    Frequency Min 2X/week   Barriers to discharge  Decreased caregiver support home alone some of the day    Co-evaluation               AM-PAC PT "6 Clicks" Daily Activity  Outcome Measure Difficulty turning over in bed (including adjusting bedclothes, sheets and blankets)?: None Difficulty moving from lying on back to sitting on the side of the bed? : Total Difficulty sitting down on and standing up from a chair with arms (e.g., wheelchair, bedside commode, etc,.)?: Total Help needed moving to and from a bed to chair (including a wheelchair)?: A Little Help needed walking in hospital room?: A Little Help needed climbing 3-5 steps with a railing? : A Lot 6 Click Score: 14    End of Session Equipment Utilized During Treatment: Gait belt Activity Tolerance: Patient limited by fatigue Patient left: in chair;with call bell/phone within reach;with family/visitor present;with chair alarm set Nurse Communication: Mobility status PT Visit Diagnosis: Unsteadiness on feet (R26.81);Muscle weakness (generalized) (M62.81);Pain Pain - part of body:  (back)    Time: [id number] PT Time Calculation (min) (ACUTE ONLY): 23 min   Charges:   PT Evaluation $PT Eval Moderate Complexity: 1 Mod PT Treatments $Therapeutic Activity: 8-22 mins   PT G Codes:   PT G-Codes **NOT FOR INPATIENT CLASS** Functional Assessment Tool Used: Clinical judgement Functional Limitation: Mobility: Walking and moving around Mobility: Walking and Moving Around Current Status ([id number]([id number]): At least 20 percent but less than 40 percent impaired, limited or restricted Mobility: Walking and Moving Around Goal Status [phone]([id number]): At least 1 percent but less than 20 percent impaired, limited or restricted    Rocky HillShauna Lusia Greis, [place]PT, DPT [phone]  Blake Divine A Lindia Garms 10/24/2016, 9:44 AM

## 2016-10-24 NOTE — Progress Notes (Addendum)
PROGRESS NOTE    Jordan [name]   ZOX:[id number]  DOB: 03-17-1918  DOA: 10/23/2016 PCP: Kaleen Mask, MD   Brief Narrative:  Jordan [name]   83 y.o. male with medical history significant A. fib on, hypertension, hypothyroidism, COPD not on home oxygen and noncompliant with inhalers, chronic diastolic heart failure presents to the emergency department with chief complaint bright red blood per rectum and generalized weakness. Daughter noted when patient awakened he had moderate to large amount of blood noted in patient's underpants and in his bed linen Workup in the emergency department reveals some respiratory distress likely related to COPD exacerbation and a negative FOBT. Daughter reports his current respiratory status is baseline and he "always has wheezes". She also reports he is noncompliant with his inhaler and acknowledges he has "confusion" over the last 6 months and she helps him with his meds but does not present him with his inhaler.   In ER, RR 31 with wheeze.    Subjective: No complaints.  ROS: no complaints of nausea, vomiting, constipation diarrhea, cough, dyspnea or dysuria. No other complaints.   Assessment & Plan:   Principal Problem:   Respiratory distress/ COPD exacerbation - cont Solumedrol, Nebs, Flutter valve, Mucinex - non-compliant at home  Active Problems:   BRBPR (bright red blood per rectum)  - skin breakdown noted in perianal area which could have bled in setting of supratheraputic INR - FOBT neg - Hb stable    Hypertension - HCTZ on hold which I agree with - still is hypertensive, start  BBlocker for rate control as well   PAF -CHA2DS2-VASc Score 4 - Coumadin - Coreg previously held due to hypotension but now hypertensive- can resume but in setting of  COPD will use Metoprolol instead     Chronic diastolic heart failure  - daily weights and I and Os    Chronic kidney disease (CKD) stage 4 - stable    GERD (gastroesophageal reflux  disease) - protonix    Hypothyroidism - synthroid  Deconditioned - SNF recommended  DVT prophylaxis:  Coumadin Code Status: Full code Family Communication:  Disposition Plan: PT recommends SNF Consultants:    Procedures:    Antimicrobials:  Anti-infectives    None       Objective: Vitals:   10/23/16 2328 10/24/16 0350 10/24/16 0845 10/24/16 1316  BP: (!) 145/73 (!) 151/78 132/63 (!) 179/83  Pulse: 89 90 88 90  Resp: (!) 21 (!) 23 (!) 22 20  Temp: 97.7 F (36.5 C) 98.5 F (36.9 C) 98.6 F (37 C) 98.3 F (36.8 C)  TempSrc: Oral Oral Oral Oral  SpO2: 95% 98% 97% 100%  Weight:  77.2 kg (170 lb 3.1 oz)      Intake/Output Summary (Last 24 hours) at 10/24/16 1336 Last data filed at 10/23/16 2000  Gross per 24 hour  Intake                0 ml  Output              100 ml  Net             -100 ml   Filed Weights   10/24/16 0350  Weight: 77.2 kg (170 lb 3.1 oz)    Examination: General exam: Appears comfortable  HEENT: PERRLA, oral mucosa moist, no sclera icterus or thrush Respiratory system: wheezing bilaterally. Respiratory effort normal. Cardiovascular system: S1 & S2 heard, RRR.  No murmurs  Gastrointestinal system: Abdomen soft, non-tender, nondistended.  Normal bowel sound. No organomegaly Central nervous system: Alert and oriented. No focal neurological deficits. Extremities: No cyanosis, clubbing or edema Skin: No rashes or ulcers Psychiatry:  Mood & affect appropriate.     Data Reviewed: I have personally reviewed following labs and imaging studies  CBC:  Recent Labs Lab 10/23/16 1200 10/23/16 1502 10/23/16 2156 10/24/16 0229  WBC 9.3  --  7.4 10.7*  NEUTROABS 5.1  --   --   --   HGB 13.7 13.3 13.2 13.0  HCT 39.5 39.0 38.3* 37.7*  MCV 98.0  --  96.0 95.7  PLT 204  --  218 220   Basic Metabolic Panel:  Recent Labs Lab 10/23/16 1200 10/23/16 1502 10/24/16 0229  NA 141 144 142  K 4.0 4.0 4.2  CL 112* 110 111  CO2 21*  --  23    GLUCOSE 115* 97 150*  BUN 31* 40* 29*  CREATININE 1.85* 1.60* 1.86*  CALCIUM 8.4*  --  8.7*   GFR: Estimated Creatinine Clearance: 22.5 mL/min (A) (by C-G formula based on SCr of 1.86 mg/dL (H)). Liver Function Tests:  Recent Labs Lab 10/23/16 1200  AST 21  ALT 11*  ALKPHOS 106  BILITOT 0.6  PROT 5.8*  ALBUMIN 3.3*   No results for input(s): LIPASE, AMYLASE in the last 168 hours. No results for input(s): AMMONIA in the last 168 hours. Coagulation Profile:  Recent Labs Lab 10/23/16 1200 10/24/16 0229  INR 3.30 3.15   Cardiac Enzymes: No results for input(s): CKTOTAL, CKMB, CKMBINDEX, TROPONINI in the last 168 hours. BNP (last 3 results) No results for input(s): PROBNP in the last 8760 hours. HbA1C: No results for input(s): HGBA1C in the last 72 hours. CBG: No results for input(s): GLUCAP in the last 168 hours. Lipid Profile: No results for input(s): CHOL, HDL, LDLCALC, TRIG, CHOLHDL, LDLDIRECT in the last 72 hours. Thyroid Function Tests: No results for input(s): TSH, T4TOTAL, FREET4, T3FREE, THYROIDAB in the last 72 hours. Anemia Panel:  Recent Labs  10/23/16 1647  VITAMINB12 283   Urine analysis:    Component Value Date/Time   COLORURINE YELLOW 10/23/2016 1344   APPEARANCEUR CLEAR 10/23/2016 1344   LABSPEC 1.027 10/23/2016 1344   PHURINE 5.0 10/23/2016 1344   GLUCOSEU NEGATIVE 10/23/2016 1344   HGBUR NEGATIVE 10/23/2016 1344   BILIRUBINUR NEGATIVE 10/23/2016 1344   KETONESUR NEGATIVE 10/23/2016 1344   PROTEINUR 30 (A) 10/23/2016 1344   UROBILINOGEN 0.2 07/10/2013 1520   NITRITE NEGATIVE 10/23/2016 1344   LEUKOCYTESUR NEGATIVE 10/23/2016 1344   Sepsis Labs: @LABRCNTIP (procalcitonin:4,lacticidven:4) )No results found for this or any previous visit (from the past 240 hour(s)).       Radiology Studies: Dg Chest Portable 1 View  Result Date: 10/23/2016 CLINICAL DATA:  Progressive weakness over last several weeks, today found by members in a  blood-stained chair while eating breakfast, history COPD, hypertension, GERD, CHF EXAM: PORTABLE CHEST 1 VIEW COMPARISON:  Portable exam 1216 hours compared to 07/16/2016 FINDINGS: Normal heart size, mediastinal contours, and pulmonary vascularity. Chronic interstitial changes at lung bases greater on RIGHT, stable. Underlying emphysematous changes. No acute infiltrate, pleural effusion or pneumothorax. Bones demineralized. IMPRESSION: Emphysematous and basilar chronic interstitial changes. No acute abnormalities. Electronically Signed   By: Ulyses SouthwardMark  Boles M.D.   On: 10/23/2016 12:26      Scheduled Meds: . guaiFENesin  600 mg Oral BID  . latanoprost  1 drop Both Eyes QHS  . levothyroxine  50 mcg Oral QAC breakfast  . methylPREDNISolone (  SOLU-MEDROL) injection  60 mg Intravenous Q12H  . pantoprazole  40 mg Oral Daily  . senna-docusate  1 tablet Oral Daily  . warfarin  2.5 mg Oral ONCE-1800  . Warfarin - Pharmacist Dosing Inpatient   Does not apply q1800   Continuous Infusions:   LOS: 0 days    Time spent in minutes: 35    Calvert Cantor, MD [facility] Pager: [email or web address] Password [email or web address] 10/24/2016, 1:36 PM

## 2016-10-24 NOTE — Clinical Social Work Note (Signed)
Clinical Social Work Assessment  Patient Details  Name: Jordan BeatRalph B [name] MRN: [id number] Date of Birth: [birth year]  Date of referral:  10/24/16               Reason for consult:  Facility Placement                Permission sought to share information with:  Family Supports, Magazine features editoracility Contact Representative Permission granted to share information::  No (patient disoriented)  Name::     MicrobiologistKatrine East  Agency::  SNFs  Relationship::  Daughter   Contact Information:  [phone]   Housing/Transportation Living arrangements for the past 2 months:  Single Family Home Source of Information:  Adult Children Patient Interpreter Needed:  None Criminal Activity/Legal Involvement Pertinent to Current Situation/Hospitalization:  No - Comment as needed Significant Relationships:  Adult Children Lives with:  Adult Children Do you feel safe going back to the place where you live?  Yes Need for family participation in patient care:  Yes (Comment)  Care giving concerns: Patient is from home with daughter, [name]. PT recommends SNF.   Social Worker assessment / plan: CSW called patient's daughter, Wilhemena DurieKatrine and discussed PT recommendation for SNF, as [name]'s number is not listed. Daughter would like for patient to come home, but is understanding of need for SNF and agreeable if that is what's recommended. CSW informed daughter of need for three night qualifying stay for Medicare to cover SNF. Wilhemena DurieKatrine will be at pt's bedside tomorrow morning; CSW will meet with daughter then and check in. CSW to follow and support with disposition planning.   Employment status:  Retired Health and safety inspectornsurance information:  Medicare PT Recommendations:  Skilled Nursing Facility Information / Referral to community resources:  Skilled Nursing Facility  Patient/Family's Response to care:  Daughter with questions about patient's mobility and is appreciative of care.  Patient/Family's Understanding of and Emotional Response to Diagnosis,  Current Treatment, and Prognosis: Daughter with understanding of patient's condition and with questions about his mobility and deconditioned state. Daughter agreeable to SNF and hopeful for pt mobility recovery there so he can return home.  Emotional Assessment Appearance:  Appears stated age Attitude/Demeanor/Rapport:  Unable to Assess (disoriented) Affect (typically observed):  Unable to Assess (disoriented) Orientation:  Oriented to Self Alcohol / Substance use:  Not Applicable Psych involvement (Current and /or in the community):  No (Comment)  Discharge Needs  Concerns to be addressed:  Discharge Planning Concerns Readmission within the last 30 days:  No Current discharge risk:  Physical Impairment, Cognitively Impaired Barriers to Discharge:  Continued Medical Work up   Abigail ButtsSusan Mattilyn Crites, LCSW 10/24/2016, 4:28 PM

## 2016-10-25 DIAGNOSIS — R0603 Acute respiratory distress: Secondary | ICD-10-CM

## 2016-10-25 DIAGNOSIS — Z7901 Long term (current) use of anticoagulants: Secondary | ICD-10-CM

## 2016-10-25 DIAGNOSIS — I5032 Chronic diastolic (congestive) heart failure: Secondary | ICD-10-CM

## 2016-10-25 DIAGNOSIS — N183 Chronic kidney disease, stage 3 (moderate): Secondary | ICD-10-CM

## 2016-10-25 DIAGNOSIS — I4891 Unspecified atrial fibrillation: Secondary | ICD-10-CM

## 2016-10-25 DIAGNOSIS — K625 Hemorrhage of anus and rectum: Secondary | ICD-10-CM

## 2016-10-25 DIAGNOSIS — J441 Chronic obstructive pulmonary disease with (acute) exacerbation: Principal | ICD-10-CM

## 2016-10-25 DIAGNOSIS — N184 Chronic kidney disease, stage 4 (severe): Secondary | ICD-10-CM

## 2016-10-25 LAB — GLUCOSE, CAPILLARY
Glucose-Capillary: 162 mg/dL — ABNORMAL HIGH (ref 65–99)
Glucose-Capillary: 130 mg/dL — ABNORMAL HIGH (ref 65–99)

## 2016-10-25 LAB — PROTIME-INR
INR: 2.23
Prothrombin Time: 25.1 seconds — ABNORMAL HIGH (ref 11.4–15.2)

## 2016-10-25 MED ORDER — WARFARIN SODIUM 7.5 MG PO TABS
3.7500 mg | ORAL_TABLET | Freq: Once | ORAL | Status: AC
  Administered 2016-10-25: 3.75 mg via ORAL

## 2016-10-25 MED ORDER — IPRATROPIUM-ALBUTEROL 0.5-2.5 (3) MG/3ML IN SOLN
3.0000 mL | Freq: Four times a day (QID) | RESPIRATORY_TRACT | Status: DC
  Administered 2016-10-26 – 2016-10-27 (×5): 3 mL via RESPIRATORY_TRACT
  Filled 2016-10-25 (×6): qty 3

## 2016-10-25 MED ORDER — IPRATROPIUM-ALBUTEROL 0.5-2.5 (3) MG/3ML IN SOLN
3.0000 mL | RESPIRATORY_TRACT | Status: DC
  Administered 2016-10-25 (×2): 3 mL via RESPIRATORY_TRACT
  Filled 2016-10-25 (×2): qty 3

## 2016-10-25 NOTE — Plan of Care (Signed)
Problem: Safety: Goal: Ability to remain free from injury will improve Falls risk protocol implemented, Family stayed in room all night with patient.  No falls or injuries on this shift  Problem: Skin Integrity: Goal: Risk for impaired skin integrity will decrease Outcome: Progressing Patient remains incontinent, has a sacral foam pad on. Inner thighs are still irritated; peri care done and miconazole powder used throughout the night . Patient has bruising on left arm from pulling out IV earlier, had a new IV put in at beginning of shift.

## 2016-10-25 NOTE — Discharge Instructions (Signed)

## 2016-10-25 NOTE — NC FL2 (Signed)
[place] MEDICAID FL2 LEVEL OF CARE SCREENING TOOL     IDENTIFICATION  Patient Name: Jordan [name] Birthdate: 1918-09-21 Sex: male Admission Date (Current Location): 10/23/2016  [facility] and [place] Number:  Producer, television/film/video and Address:  [facility] [place], [address] [place], [place] [address]      Provider Number: [id number]  Attending Physician Name and Address:  Cleora Fleet, MD  Relative Name and Phone Number:  [name] [phone]    Current Level of Care: Hospital Recommended Level of Care: Skilled Nursing Facility Prior Approval Number:    Date Approved/Denied:   PASRR Number: [id number]  Discharge Plan: SNF    Current Diagnoses: Patient Active Problem List   Diagnosis Date Noted  . CKD (chronic kidney disease) stage 4, GFR 15-29 ml/min (HCC) 10/24/2016  . Respiratory distress 10/23/2016  . BRBPR (bright red blood per rectum) 10/23/2016  . Paroxysmal atrial fibrillation (HCC)   . Closed compression fracture of L2 lumbar vertebra (HCC)/ ACUTE/SUBACUTE 07/18/2016  . Abdominal distention   . Low back pain   . AKI (acute kidney injury) (HCC) 07/16/2016  . Fecal impaction (HCC) 07/16/2016  . GERD (gastroesophageal reflux disease) 07/16/2016  . Femur fracture, left (HCC) 05/07/2015  . Hip fracture (HCC) 05/06/2015  . Faintness 09/16/2013  . 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 Syncope 07/10/2013  . Pneumonia 05/23/2013  . Dysphagia 05/09/2013  . Acute blood loss anemia 05/08/2013  . Food impaction of esophagus 04/26/2013  . Closed right hip fracture (HCC) 04/24/2013  . CAP (community acquired pneumonia) 04/24/2013  . Leukocytosis 04/24/2013  . Encounter for therapeutic drug monitoring 04/18/2013  . COPD exacerbation (HCC) 01/01/2013  . Chronic anticoagulation 01/01/2013  . Chronic diastolic heart failure (HCC) 01/01/2013  . Diarrhea 01/01/2013  . Atrial fibrillation (HCC) 12/18/2012  . Bradycardia 12/16/2012  .  Hypertension 12/16/2012  . Hypothyroidism 12/16/2012  . Acute kidney failure (HCC) 12/16/2012    Orientation RESPIRATION BLADDER Height & Weight     Self, Place  Normal Incontinent Weight: 171 lb 11.8 oz (77.9 kg) Height:     BEHAVIORAL SYMPTOMS/MOOD NEUROLOGICAL BOWEL NUTRITION STATUS        Diet (heart; please see DC summary)  AMBULATORY STATUS COMMUNICATION OF NEEDS Skin   Extensive Assist Verbally Normal                       Personal Care Assistance Level of Assistance  Bathing, Feeding, Dressing Bathing Assistance: Maximum assistance Feeding assistance: Independent Dressing Assistance: Maximum assistance     Functional Limitations Info  Sight, Hearing, Speech Sight Info: Adequate Hearing Info: Impaired Speech Info: Adequate    SPECIAL CARE FACTORS FREQUENCY  PT (By licensed PT)     PT Frequency: 5x/week              Contractures Contractures Info: Not present    Additional Factors Info  Code Status, Allergies Code Status Info: Full Allergies Info: Diltiazem Hcl Er Beads, Metoprolol Succinate Er Metoprolol Tartrate           Current Medications (10/25/2016):  This is the current hospital active medication list Current Facility-Administered Medications  Medication Dose Route Frequency Provider Last Rate Last Dose  . acetaminophen (TYLENOL) tablet 650 mg  650 mg Oral Q6H PRN Gwenyth Bender, NP       Or  . acetaminophen (TYLENOL) suppository 650 mg  650 mg Rectal Q6H PRN Black, Lesle Chris, NP      .  albuterol (PROVENTIL) (2.5 MG/3ML) 0.083% nebulizer solution 2.5 mg  2.5 mg Nebulization Q2H PRN Black, Lesle ChrisKaren M, NP      . guaiFENesin (MUCINEX) 12 hr tablet 600 mg  600 mg Oral BID Toya SmothersBlack, Karen M, NP   600 mg at 10/25/16 0855  . ipratropium-albuterol (DUONEB) 0.5-2.5 (3) MG/3ML nebulizer solution 3 mL  3 mL Nebulization Q4H Johnson, Clanford L, MD      . latanoprost (XALATAN) 0.005 % ophthalmic solution 1 drop  1 drop Both Eyes QHS Gwenyth BenderBlack, Karen M, NP   1  drop at 10/24/16 2102  . levothyroxine (SYNTHROID, LEVOTHROID) tablet 50 mcg  50 mcg Oral QAC breakfast Gwenyth BenderBlack, Karen M, NP   50 mcg at 10/25/16 0855  . methylPREDNISolone sodium succinate (SOLU-MEDROL) 125 mg/2 mL injection 60 mg  60 mg Intravenous Q12H Gwenyth BenderBlack, Karen M, NP   60 mg at 10/25/16 0854  . pantoprazole (PROTONIX) EC tablet 40 mg  40 mg Oral Daily Gwenyth BenderBlack, Karen M, NP   40 mg at 10/25/16 0855  . senna-docusate (Senokot-S) tablet 1 tablet  1 tablet Oral Daily Gwenyth BenderBlack, Karen M, NP   1 tablet at 10/25/16 0855  . warfarin (COUMADIN) tablet 3.75 mg  3.75 mg Oral ONCE-1800 Gerilyn NestleDang, Thuy D, [facility]      . Warfarin - Pharmacist Dosing Inpatient   Does not apply q1800 Kathyrn SheriffFoltanski, Lindsey N, [facility]PH         Discharge Medications: Please see discharge summary for a list of discharge medications.  Relevant Imaging Results:  Relevant Lab Results:   Additional Information SSN: [id number]  Abigail ButtsSusan Aelyn Stanaland, LCSW

## 2016-10-25 NOTE — Progress Notes (Signed)
Physical Therapy Treatment Patient Details Name: Jordan [name] MRN: [id number] DOB: [birth year] Today's Date: 10/25/2016    History of Present Illness Patient is a 83 y/o male who presents with respiratory distress secondary to COPD exacerbation in the setting of general physical deconditioning due to age; also with blood from rectum. PMH includes PAF on Coumadin, hypothyroidism, CHF, CKD, chronic back pain, dementia, s/p rt THA 2017.    PT Comments    Pt with slow progress towards goals. CSW and case management notified PT, that family wanting to see how pt moves.  Pt requiring mod A for this session for transfers secondary to BLE trembling, decreased balance, and posterior lean. Required continuous cues for sequencing during all transfers. Tolerated exercise program this session, however, required demonstration for appropriate technique. Discussed current recommendations with pt and pt's family and are agreeable. Continue to recommend SNF given current safety deficits and increased fall risk. Will continue to follow acutely to maximize functional mobility independence and safety.    Follow Up Recommendations  SNF;Supervision for mobility/OOB     Equipment Recommendations  None recommended by PT    Recommendations for Other Services OT consult     Precautions / Restrictions Precautions Precautions: Fall Restrictions Weight Bearing Restrictions: No    Mobility  Bed Mobility Overal bed mobility: Needs Assistance Bed Mobility: Rolling;Sidelying to Sit Rolling: Mod assist Sidelying to sit: Mod assist       General bed mobility comments: Step by step cues required for bed mobility. Mod A for trunk elevation and LE management. Also required assist using bed pad to scoot hips to EOB. Use of bed rails and elevated HOB.   Transfers Overall transfer level: Needs assistance Equipment used: Rolling walker (2 wheeled) Transfers: Sit to/from UGI CorporationStand;Stand Pivot Transfers Sit to Stand: Mod  assist Stand pivot transfers: Mod assist       General transfer comment: Mod A for lift assist and steadying to stand. Verbal cues to stand fully upright. BLE trembling noted in standing. Able to take a few steps to chair during stand pivot transfer, however, pt required continuous cues for LE sequencing and sequencing using RW. Pt with posterior lean and decreased steadiness requiring mod A for balance. Pt repeating "I can't take steps."  Ambulation/Gait                 Stairs            Wheelchair Mobility    Modified Rankin (Stroke Patients Only)       Balance Overall balance assessment: Needs assistance Sitting-balance support: Feet supported;Single extremity supported Sitting balance-Leahy Scale: Fair     Standing balance support: During functional activity;Bilateral upper extremity supported Standing balance-Leahy Scale: Poor Standing balance comment: Reliant on BUEs for support in standing and Mod A due to posterior lean.                             Cognition Arousal/Alertness: Awake/alert Behavior During Therapy: WFL for tasks assessed/performed Overall Cognitive Status: History of cognitive impairments - at baseline Area of Impairment: Orientation;Safety/judgement;Awareness;Problem solving;Following commands                 Orientation Level: Disoriented to;Place;Time;Situation     Following Commands: Follows one step commands inconsistently;Follows one step commands with increased time Safety/Judgement: Decreased awareness of safety;Decreased awareness of deficits Awareness: Intellectual Problem Solving: Slow processing;Decreased initiation;Difficulty sequencing;Requires verbal cues;Requires tactile cues  Exercises General Exercises - Upper Extremity Shoulder Flexion: AROM;Both;10 reps;Seated General Exercises - Lower Extremity Long Arc Quad: AROM;Both;10 reps;Seated Hip ABduction/ADduction: AROM;Both;10 reps;Seated Hip  Flexion/Marching: AROM;Both;10 reps;Seated Toe Raises: AROM;Both;15 reps;Seated Heel Raises: AROM;Both;15 reps;Seated    General Comments General comments (skin integrity, edema, etc.): Family present during session.       Pertinent Vitals/Pain Pain Assessment: No/denies pain    Home Living                      Prior Function            PT Goals (current goals can now be found in the care plan section) Acute Rehab PT Goals Patient Stated Goal: none stated PT Goal Formulation: Patient unable to participate in goal setting Time For Goal Achievement: 11/07/16 Potential to Achieve Goals: Fair Progress towards PT goals: Progressing toward goals    Frequency    Min 2X/week      PT Plan Current plan remains appropriate    Co-evaluation              AM-PAC PT "6 Clicks" Daily Activity  Outcome Measure  Difficulty turning over in bed (including adjusting bedclothes, sheets and blankets)?: Total Difficulty moving from lying on back to sitting on the side of the bed? : Total Difficulty sitting down on and standing up from a chair with arms (e.g., wheelchair, bedside commode, etc,.)?: Total Help needed moving to and from a bed to chair (including a wheelchair)?: A Lot Help needed walking in hospital room?: A Lot Help needed climbing 3-5 steps with a railing? : A Lot 6 Click Score: 9    End of Session Equipment Utilized During Treatment: Gait belt Activity Tolerance: Patient limited by fatigue Patient left: in chair;with call bell/phone within reach;with family/visitor present;with chair alarm set Nurse Communication: Mobility status PT Visit Diagnosis: Unsteadiness on feet (R26.81);Muscle weakness (generalized) (M62.81)     Time: 1610-9604 PT Time Calculation (min) (ACUTE ONLY): 25 min  Charges:  $Therapeutic Exercise: 8-22 mins $Therapeutic Activity: 8-22 mins                    G Codes:       Gladys Damme, PT, DPT  Acute Rehabilitation  Services  Pager: [phone]    [name] 10/25/2016, 12:10 PM

## 2016-10-25 NOTE — Progress Notes (Signed)
PROGRESS NOTE   Jordan [name]   WUJ:[id number]  DOB: 08/10/1918  DOA: 10/23/2016 PCP: Kaleen Mask, MD   Brief Narrative:  Jordan [name]   83 y.o. male with medical history significant A. fib on, hypertension, hypothyroidism, COPD not on home oxygen and noncompliant with inhalers, chronic diastolic heart failure presents to the emergency department with chief complaint bright red blood per rectum and generalized weakness. Daughter noted when patient awakened he had moderate to large amount of blood noted in patient's underpants and in his bed linen Workup in the emergency department reveals some respiratory distress likely related to COPD exacerbation and a negative FOBT. Daughter reports his current respiratory status is baseline and he "always has wheezes". She also reports he is noncompliant with his inhaler and acknowledges he has "confusion" over the last 6 months and she helps him with his meds but does not present him with his inhaler.   In ER, RR 31 with wheeze.   Subjective: No complaints.  ROS: no complaints of nausea, vomiting, constipation diarrhea, cough, dyspnea or dysuria. No other complaints.   Assessment & Plan:   Principal Problem:   Respiratory distress/ COPD exacerbation - cont Solumedrol, schedule Nebs, Flutter valve, Mucinex - non-compliant at home    BRBPR (bright red blood per rectum)  - skin breakdown noted in perianal area which could have bled in setting of supratheraputic INR - FOBT neg - Hb stable    Hypertension -  Stable   PAF -CHA2DS2-VASc Score 4 - Coumadin for anticoagulation     Chronic diastolic heart failure  - daily weights and I and Os    Chronic kidney disease (CKD) stage 4 - stable    GERD (gastroesophageal reflux disease) - protonix    Hypothyroidism - synthroid  Deconditioned - SNF recommended  DVT prophylaxis:  Coumadin Code Status: Full code Family Communication:  Disposition Plan: SNF tomorrow if bed  available Consultants:    Procedures:    Antimicrobials:  Anti-infectives    None     Objective: Vitals:   10/24/16 1316 10/24/16 1625 10/25/16 0506 10/25/16 0520  BP: (!) 179/83 (!) 121/50 106/76   Pulse: 90 79 79 76  Resp: 20 20 13  (!) 22  Temp: 98.3 F (36.8 C) 98.2 F (36.8 C)    TempSrc: Oral Oral    SpO2: 100% 99% 96%   Weight:   77.9 kg (171 lb 11.8 oz)     Intake/Output Summary (Last 24 hours) at 10/25/16 1417 Last data filed at 10/25/16 0900  Gross per 24 hour  Intake              240 ml  Output              200 ml  Net               40 ml   Filed Weights   10/24/16 0350 10/25/16 0506  Weight: 77.2 kg (170 lb 3.1 oz) 77.9 kg (171 lb 11.8 oz)    Examination: General exam: Appears comfortable  HEENT: PERRLA, oral mucosa moist, no sclera icterus or thrush Respiratory system: persistent wheezing bilaterally. Respiratory effort normal. Cardiovascular system: S1 & S2 heard, RRR.  No murmurs  Gastrointestinal system: Abdomen soft, non-tender, nondistended. Normal bowel sound. No organomegaly Central nervous system: Alert and oriented. No focal neurological deficits. Extremities: No cyanosis, clubbing or edema Skin: No rashes or ulcers Psychiatry:  Mood & affect appropriate.   Data Reviewed: I have personally reviewed  following labs and imaging studies  CBC:  Recent Labs Lab 10/23/16 1200 10/23/16 1502 10/23/16 1645 10/23/16 2156 10/24/16 0229  WBC 9.3  --   --  7.4 10.7*  NEUTROABS 5.1  --   --   --   --   HGB 13.7 13.3  --  13.2 13.0  HCT 39.5 39.0 41.3 38.3* 37.7*  MCV 98.0  --   --  96.0 95.7  PLT 204  --   --  218 220   Basic Metabolic Panel:  Recent Labs Lab 10/23/16 1200 10/23/16 1502 10/24/16 0229  NA 141 144 142  K 4.0 4.0 4.2  CL 112* 110 111  CO2 21*  --  23  GLUCOSE 115* 97 150*  BUN 31* 40* 29*  CREATININE 1.85* 1.60* 1.86*  CALCIUM 8.4*  --  8.7*   GFR: Estimated Creatinine Clearance: 22.5 mL/min (A) (by C-G formula  based on SCr of 1.86 mg/dL (H)). Liver Function Tests:  Recent Labs Lab 10/23/16 1200  AST 21  ALT 11*  ALKPHOS 106  BILITOT 0.6  PROT 5.8*  ALBUMIN 3.3*   No results for input(s): LIPASE, AMYLASE in the last 168 hours. No results for input(s): AMMONIA in the last 168 hours. Coagulation Profile:  Recent Labs Lab 10/23/16 1200 10/24/16 0229 10/25/16 0513  INR 3.30 3.15 2.23   Cardiac Enzymes: No results for input(s): CKTOTAL, CKMB, CKMBINDEX, TROPONINI in the last 168 hours. BNP (last 3 results) No results for input(s): PROBNP in the last 8760 hours. HbA1C: No results for input(s): HGBA1C in the last 72 hours. CBG:  Recent Labs Lab 10/25/16 0748 10/25/16 1130  GLUCAP 162* 130*   Lipid Profile: No results for input(s): CHOL, HDL, LDLCALC, TRIG, CHOLHDL, LDLDIRECT in the last 72 hours. Thyroid Function Tests: No results for input(s): TSH, T4TOTAL, FREET4, T3FREE, THYROIDAB in the last 72 hours. Anemia Panel:  Recent Labs  10/23/16 1647  VITAMINB12 283   Urine analysis:    Component Value Date/Time   COLORURINE YELLOW 10/23/2016 1344   APPEARANCEUR CLEAR 10/23/2016 1344   LABSPEC 1.027 10/23/2016 1344   PHURINE 5.0 10/23/2016 1344   GLUCOSEU NEGATIVE 10/23/2016 1344   HGBUR NEGATIVE 10/23/2016 1344   BILIRUBINUR NEGATIVE 10/23/2016 1344   KETONESUR NEGATIVE 10/23/2016 1344   PROTEINUR 30 (A) 10/23/2016 1344   UROBILINOGEN 0.2 07/10/2013 1520   NITRITE NEGATIVE 10/23/2016 1344   LEUKOCYTESUR NEGATIVE 10/23/2016 1344   )No results found for this or any previous visit (from the past 240 hour(s)).   Radiology Studies: No results found.  Scheduled Meds: . guaiFENesin  600 mg Oral BID  . ipratropium-albuterol  3 mL Nebulization Q4H  . latanoprost  1 drop Both Eyes QHS  . levothyroxine  50 mcg Oral QAC breakfast  . methylPREDNISolone (SOLU-MEDROL) injection  60 mg Intravenous Q12H  . pantoprazole  40 mg Oral Daily  . senna-docusate  1 tablet Oral  Daily  . warfarin  3.75 mg Oral ONCE-1800  . Warfarin - Pharmacist Dosing Inpatient   Does not apply q1800   Continuous Infusions:   LOS: 1 day   Time spent in minutes: 32  Standley Dakinslanford Johnson, MD [facility] Pager:  [phone] [email or web address] Password [email or web address] 10/25/2016, 2:17 PM

## 2016-10-25 NOTE — Progress Notes (Signed)
ANTICOAGULATION CONSULT NOTE   Pharmacy Consult:  Coumadin Indication: atrial fibrillation  Allergies  Allergen Reactions  . Diltiazem Hcl Er Beads Other (See Comments)    Tachycardia  . Metoprolol Succinate Er [Metoprolol Tartrate] Other (See Comments)    hallucinations     Vital Signs: BP: 106/76 (08/15 0506) Pulse Rate: 76 (08/15 0520)  Labs:  Recent Labs  10/23/16 1200 10/23/16 1502 10/23/16 1645 10/23/16 2156 10/24/16 0229 10/25/16 0513  HGB 13.7 13.3  --  13.2 13.0  --   HCT 39.5 39.0 41.3 38.3* 37.7*  --   PLT 204  --   --  218 220  --   LABPROT 34.3*  --   --   --  33.1* 25.1*  INR 3.30  --   --   --  3.15 2.23  CREATININE 1.85* 1.60*  --   --  1.86*  --     Estimated Creatinine Clearance: 22.5 mL/min (A) (by C-G formula based on SCr of 1.86 mg/dL (H)).    Assessment: 89 YOM on Coumadin PTA for Afib presented to the ED with SOB and complaint of BRBPR. In ED, FOBT negative and CBC stable.  Patient was deemed to have low suspicion for active GI bleed and BRBPR is likely from internal hemorrhoids.  Patient's INR decreased to therapeutic level after missing 10/23/16 dose.  No serious bleeding reported.  PTA warfarin dose: 2.5 mg daily except 3.75 mg on MWF   Goal of Therapy:  INR 2-3    Plan:  Coumadin 3.75mg  PO today Daily PT / INR    Alexismarie Flaim D. Laney Potashang, PharmD, BCPS Pager:  [phone] 10/25/2016, 11:39 AM

## 2016-10-26 DIAGNOSIS — I10 Essential (primary) hypertension: Secondary | ICD-10-CM

## 2016-10-26 LAB — PROTIME-INR
INR: 2.37
PROTHROMBIN TIME: 26.4 s — AB (ref 11.4–15.2)

## 2016-10-26 LAB — BASIC METABOLIC PANEL
ANION GAP: 11 (ref 5–15)
BUN: 46 mg/dL — ABNORMAL HIGH (ref 6–20)
CALCIUM: 8.2 mg/dL — AB (ref 8.9–10.3)
CHLORIDE: 114 mmol/L — AB (ref 101–111)
CO2: 17 mmol/L — ABNORMAL LOW (ref 22–32)
Creatinine, Ser: 1.79 mg/dL — ABNORMAL HIGH (ref 0.61–1.24)
GFR calc non Af Amer: 30 mL/min — ABNORMAL LOW (ref >60)
GFR, EST AFRICAN AMERICAN: 35 mL/min — AB (ref >60)
GLUCOSE: 201 mg/dL — AB (ref 65–99)
Potassium: 4.4 mmol/L (ref 3.5–5.1)
Sodium: 142 mmol/L (ref 135–145)

## 2016-10-26 LAB — MAGNESIUM: Magnesium: 2 mg/dL (ref 1.7–2.4)

## 2016-10-26 MED ORDER — WARFARIN SODIUM 2.5 MG PO TABS
2.5000 mg | ORAL_TABLET | Freq: Once | ORAL | Status: AC
  Administered 2016-10-26: 2.5 mg via ORAL
  Filled 2016-10-26: qty 1

## 2016-10-26 NOTE — Progress Notes (Signed)
CSW met with patient's daughter at bedside at gave SNF bed offers. Daughter chose [facility]. CSW coordinated with [facility] admission and they will accept patient when ready for discharge. Stephanie from [facility] to meet with daughter at bedside today. CSW will continue to follow and support with discharge to SNF when ready.  Estanislado Emms, [place]

## 2016-10-26 NOTE — Progress Notes (Signed)
ANTICOAGULATION CONSULT NOTE   Pharmacy Consult:  Coumadin Indication: atrial fibrillation  Allergies  Allergen Reactions  . Diltiazem Hcl Er Beads Other (See Comments)    Tachycardia  . Metoprolol Succinate Er [Metoprolol Tartrate] Other (See Comments)    hallucinations     Vital Signs: Temp: 97.9 F (36.6 C) (08/16 0746) Temp Source: Oral (08/16 0746) BP: 132/66 (08/16 0746) Pulse Rate: 82 (08/16 0746)  Labs:  Recent Labs  10/23/16 1200 10/23/16 1502 10/23/16 1645 10/23/16 2156 10/24/16 0229 10/25/16 0513 10/26/16 0334  HGB 13.7 13.3  --  13.2 13.0  --   --   HCT 39.5 39.0 41.3 38.3* 37.7*  --   --   PLT 204  --   --  218 220  --   --   LABPROT 34.3*  --   --   --  33.1* 25.1* 26.4*  INR 3.30  --   --   --  3.15 2.23 2.37  CREATININE 1.85* 1.60*  --   --  1.86*  --   --     Estimated Creatinine Clearance: 22.5 mL/min (A) (by C-G formula based on SCr of 1.86 mg/dL (H)).    Assessment: 89 YOM on Coumadin PTA for Afib presented to the ED with SOB and complaint of BRBPR. In ED, FOBT negative and CBC stable.  Patient was deemed to have low suspicion for active GI bleed and BRBPR is likely from internal hemorrhoids/skin breakdown.  Patient missed 10/23/16 dose.  INR currently therapeutic; no bleeding reported.  PTA warfarin dose: 2.5 mg daily except 3.75 mg on MWF   Goal of Therapy:  INR 2-3    Plan:  Coumadin 2.5mg  PO today Daily PT / INR    Marcquis Ridlon D. Laney Potashang, PharmD, BCPS Pager:  [phone] 10/26/2016, 9:22 AM

## 2016-10-26 NOTE — Plan of Care (Signed)
Problem: Safety: Goal: Ability to remain free from injury will improve Outcome: Progressing Fall risk bundle in place per policy. No falls, skin breakdown or other injuries this shift.

## 2016-10-26 NOTE — Progress Notes (Signed)
Patient had runs of 3-4 beats VTACH.  Dr Laural BenesJohnson made aware, no new orders received.

## 2016-10-26 NOTE — Progress Notes (Addendum)
PROGRESS NOTE Jordan [name]   YQM:[id number]  DOB: 1919-02-07  DOA: 10/23/2016 PCP: [name], [name], MD   Brief Narrative:  Jordan Beatalph B Stan   83 y.o. male with medical history significant A. fib on, hypertension, hypothyroidism, COPD not on home oxygen and noncompliant with inhalers, chronic diastolic heart failure presents to the emergency department with chief complaint bright red blood per rectum and generalized weakness. Daughter noted when patient awakened he had moderate to large amount of blood noted in patient's underpants and in his bed linen.   Workup in the emergency department reveals some respiratory distress likely related to COPD exacerbation and a negative FOBT. Daughter reports his current respiratory status is baseline and he "always has wheezes". She also reports he is noncompliant with his inhaler and acknowledges he has "confusion" over the last 6 months and she helps him with his meds but does not present him with his inhaler.   In ER, RR 31 with wheeze.   Subjective: Pt says he is breathing a little better, he is still weak in legs.      ROS: no complaints of nausea, vomiting, constipation diarrhea, cough, dyspnea or dysuria. No other complaints.   Assessment & Plan:     Respiratory distress/ COPD exacerbation - cont Solumedrol, schedule Nebs, Flutter valve, Mucinex - non-compliant at home    BRBPR (bright red blood per rectum)  - skin breakdown noted in perianal area which could have bled in setting of supratheraputic INR - FOBT neg - Hb stable   Hypertension -  Stable, controlled.    PAF -CHA2DS2-VASc Score 4 - Coumadin for anticoagulation    PVCs - asymptomatic, check lytes   Chronic diastolic heart failure  - daily weights and I and Os   Chronic kidney disease (CKD) stage 4 - stable   GERD (gastroesophageal reflux disease) - protonix   Hypothyroidism - synthroid  Deconditioned - SNF recommended  DVT prophylaxis:  Coumadin Code  Status: Full code Family Communication:  Disposition Plan: SNF possibly tomorrow Consultants:    Procedures:    Antimicrobials:  Anti-infectives    None     Objective: Vitals:   10/25/16 2300 10/26/16 0417 10/26/16 0746 10/26/16 0959  BP: 119/70 (!) 141/74 132/66   Pulse: 78 83 82   Resp: (!) 24 20 19    Temp:  98 F (36.7 C) 97.9 F (36.6 C)   TempSrc:  Oral Oral   SpO2: 94% 93% 95% 94%  Weight:  74.9 kg (165 lb 2 oz)      Intake/Output Summary (Last 24 hours) at 10/26/16 1048 Last data filed at 10/26/16 0900  Gross per 24 hour  Intake              340 ml  Output              100 ml  Net              240 ml   Filed Weights   10/24/16 0350 10/25/16 0506 10/26/16 0417  Weight: 77.2 kg (170 lb 3.1 oz) 77.9 kg (171 lb 11.8 oz) 74.9 kg (165 lb 2 oz)    Examination: General exam: Appears comfortable  HEENT: PERRLA, oral mucosa moist, no sclera icterus or thrush Respiratory system: good air movement, no wheezes heard Respiratory effort normal. Cardiovascular system: S1 & S2 heard, RRR.  No murmurs  Gastrointestinal system: Abdomen soft, non-tender, nondistended. Normal bowel sound. No organomegaly Central nervous system: Alert and oriented. No focal neurological deficits.  Extremities: No cyanosis, clubbing or edema Skin: No rashes or ulcers Psychiatry:  Mood & affect appropriate.   Data Reviewed: I have personally reviewed following labs and imaging studies  CBC:  Recent Labs Lab 10/23/16 1200 10/23/16 1502 10/23/16 1645 10/23/16 2156 10/24/16 0229  WBC 9.3  --   --  7.4 10.7*  NEUTROABS 5.1  --   --   --   --   HGB 13.7 13.3  --  13.2 13.0  HCT 39.5 39.0 41.3 38.3* 37.7*  MCV 98.0  --   --  96.0 95.7  PLT 204  --   --  218 220   Basic Metabolic Panel:  Recent Labs Lab 10/23/16 1200 10/23/16 1502 10/24/16 0229  NA 141 144 142  K 4.0 4.0 4.2  CL 112* 110 111  CO2 21*  --  23  GLUCOSE 115* 97 150*  BUN 31* 40* 29*  CREATININE 1.85* 1.60* 1.86*    CALCIUM 8.4*  --  8.7*   GFR: Estimated Creatinine Clearance: 22.5 mL/min (A) (by C-G formula based on SCr of 1.86 mg/dL (H)). Liver Function Tests:  Recent Labs Lab 10/23/16 1200  AST 21  ALT 11*  ALKPHOS 106  BILITOT 0.6  PROT 5.8*  ALBUMIN 3.3*   No results for input(s): LIPASE, AMYLASE in the last 168 hours. No results for input(s): AMMONIA in the last 168 hours. Coagulation Profile:  Recent Labs Lab 10/23/16 1200 10/24/16 0229 10/25/16 0513 10/26/16 0334  INR 3.30 3.15 2.23 2.37   Cardiac Enzymes: No results for input(s): CKTOTAL, CKMB, CKMBINDEX, TROPONINI in the last 168 hours. BNP (last 3 results) No results for input(s): PROBNP in the last 8760 hours. HbA1C: No results for input(s): HGBA1C in the last 72 hours. CBG:  Recent Labs Lab 10/25/16 0748 10/25/16 1130  GLUCAP 162* 130*   Lipid Profile: No results for input(s): CHOL, HDL, LDLCALC, TRIG, CHOLHDL, LDLDIRECT in the last 72 hours. Thyroid Function Tests: No results for input(s): TSH, T4TOTAL, FREET4, T3FREE, THYROIDAB in the last 72 hours. Anemia Panel:  Recent Labs  10/23/16 1647  VITAMINB12 283   Urine analysis:    Component Value Date/Time   COLORURINE YELLOW 10/23/2016 1344   APPEARANCEUR CLEAR 10/23/2016 1344   LABSPEC 1.027 10/23/2016 1344   PHURINE 5.0 10/23/2016 1344   GLUCOSEU NEGATIVE 10/23/2016 1344   HGBUR NEGATIVE 10/23/2016 1344   BILIRUBINUR NEGATIVE 10/23/2016 1344   KETONESUR NEGATIVE 10/23/2016 1344   PROTEINUR 30 (A) 10/23/2016 1344   UROBILINOGEN 0.2 07/10/2013 1520   NITRITE NEGATIVE 10/23/2016 1344   LEUKOCYTESUR NEGATIVE 10/23/2016 1344   )No results found for this or any previous visit (from the past 240 hour(s)).   Radiology Studies: No results found.  Scheduled Meds: . guaiFENesin  600 mg Oral BID  . ipratropium-albuterol  3 mL Nebulization Q6H  . latanoprost  1 drop Both Eyes QHS  . levothyroxine  50 mcg Oral QAC breakfast  . methylPREDNISolone  (SOLU-MEDROL) injection  60 mg Intravenous Q12H  . pantoprazole  40 mg Oral Daily  . senna-docusate  1 tablet Oral Daily  . warfarin  2.5 mg Oral ONCE-1800  . Warfarin - Pharmacist Dosing Inpatient   Does not apply q1800   Continuous Infusions:   LOS: 2 days   Time spent in minutes: 27  Standley Dakins, MD [facility] Pager:  [phone] [email or web address] Password [facility] 10/26/2016, 10:48 AM

## 2016-10-27 DIAGNOSIS — E039 Hypothyroidism, unspecified: Secondary | ICD-10-CM

## 2016-10-27 LAB — BASIC METABOLIC PANEL
Anion gap: 7 (ref 5–15)
BUN: 46 mg/dL — AB (ref 6–20)
CHLORIDE: 111 mmol/L (ref 101–111)
CO2: 23 mmol/L (ref 22–32)
CREATININE: 1.77 mg/dL — AB (ref 0.61–1.24)
Calcium: 8.3 mg/dL — ABNORMAL LOW (ref 8.9–10.3)
GFR calc Af Amer: 36 mL/min — ABNORMAL LOW (ref >60)
GFR calc non Af Amer: 31 mL/min — ABNORMAL LOW (ref >60)
GLUCOSE: 150 mg/dL — AB (ref 65–99)
Potassium: 4 mmol/L (ref 3.5–5.1)
SODIUM: 141 mmol/L (ref 135–145)

## 2016-10-27 LAB — PROTIME-INR
INR: 3.15
Prothrombin Time: 33.1 seconds — ABNORMAL HIGH (ref 11.4–15.2)

## 2016-10-27 LAB — MAGNESIUM: MAGNESIUM: 2 mg/dL (ref 1.7–2.4)

## 2016-10-27 MED ORDER — PREDNISONE 20 MG PO TABS: 40.0000 mg | ORAL_TABLET | Freq: Every day | ORAL | Status: AC

## 2016-10-27 MED ORDER — IPRATROPIUM-ALBUTEROL 0.5-2.5 (3) MG/3ML IN SOLN: 3.0000 mL | Freq: Three times a day (TID) | RESPIRATORY_TRACT | Status: AC

## 2016-10-27 MED ORDER — WARFARIN SODIUM 1 MG PO TABS
0.5000 mg | ORAL_TABLET | Freq: Once | ORAL | Status: AC
  Administered 2016-10-27: 0.5 mg via ORAL
  Filled 2016-10-27: qty 1

## 2016-10-27 MED ORDER — SENNOSIDES-DOCUSATE SODIUM 8.6-50 MG PO TABS: 1.0000 | ORAL_TABLET | Freq: Every day | ORAL | Status: AC

## 2016-10-27 MED ORDER — AMIODARONE HCL 200 MG PO TABS
400.0000 mg | ORAL_TABLET | Freq: Every day | ORAL | Status: DC
  Administered 2016-10-27: 400 mg via ORAL
  Filled 2016-10-27: qty 2

## 2016-10-27 MED ORDER — IPRATROPIUM-ALBUTEROL 0.5-2.5 (3) MG/3ML IN SOLN: 3.0000 mL | RESPIRATORY_TRACT | Status: DC | PRN

## 2016-10-27 MED ORDER — WARFARIN SODIUM 2 MG PO TABS: 2.0000 mg | ORAL_TABLET | Freq: Every day | ORAL | Status: DC

## 2016-10-27 MED ORDER — AMIODARONE HCL 400 MG PO TABS: ORAL_TABLET | ORAL | Status: DC

## 2016-10-27 MED ORDER — DIGOXIN 0.25 MG/ML IJ SOLN
0.1250 mg | Freq: Once | INTRAMUSCULAR | Status: DC | PRN
  Administered 2016-10-27: 0.125 mg via INTRAVENOUS
  Filled 2016-10-27 (×2): qty 2

## 2016-10-27 NOTE — Care Management Important Message (Signed)
Important Message  Patient Details  Name: Jordan [name] MRN: [id number] Date of Birth: 28-Nov-1918   Medicare Important Message Given:  Yes    [name] 10/27/2016, 11:04 AM

## 2016-10-27 NOTE — Discharge Summary (Signed)
Physician Discharge Summary  Jordan [name] AOZ:[id number] DOB: [birth year] DOA: 10/23/2016  PCP: Kaleen Mask, MD Cardiologist: Dr. Anne Fu  Admit date: 10/23/2016 Discharge date: 10/27/2016  Admitted From: Home  Disposition: SNF  Recommendations for Outpatient Follow-up:  1. Please check PT/INR daily for next 3 days and adjust warfarin doses as needed to maintain INR 2-3 2. Please give amiodarone 400 mg daily for 1 week, then 200 mg daily.  3. New warfarin dose is 2 mg daily to start 10/29/16 4. Follow up with Dr. Anne Fu in 2 weeks (cardiology) 5. Please obtain BMP/CBC in one week 6. Please continue prednisone 40 mg daily with breakfast for 1 week then discontinue 7. Continue nebulizer treatments as ordered and adjust frequency as needed 8. Continue physical therapy   Discharge Condition: STABLE   CODE STATUS: FULL    Brief Hospitalization Summary: Please see all hospital notes, images, labs for full details of the hospitalization.  HPI: Jordan [name] is a very pleasant somewhat HOH 83 y.o. male with medical history significant A. fib on, hypertension, hypothyroidism, COPD not on home oxygen and noncompliant with inhalers, chronic diastolic heart failure presents to the emergency department with chief complaint bright red blood per rectum and generalized weakness. Workup in the emergency department reveals some respiratory distress likely related to COPD exacerbation and a negative FOBT. [facility] asked to admit for COPD exacerbation.  Information is obtained from the patient and his daughter who is at the bedside and caregiver. She states this morning when patient awakened moderate to large amount of blood noted in patient's underpants and in his bed linen. She denies seeing any clots she notes that this was dried. She is unsure if he has hemorrhoids bolus concern because he is on Coumadin. Associated symptoms include generalized weakness decreased oral intake. No reports of any  abdominal pain nausea vomiting diarrhea constipation. No reports of melena. Patient denies any headache dizziness syncope or near-syncope. He denies abdominal pain. He does endorse some worsening shortness of breath but reports he is breathing better since" I got that breathing treatment and quotations. Daughter reports his current respiratory status is baseline and he "always has wheezes". She also reports he is noncompliant with his inhaler and acknowledges he has "confusion" over the last 6 months and she helps him with his meds but does not present him with his inhaler. He denies chest pain palpitations lower extremity edema.  ED Course: Emergency department he is afebrile hemodynamically stable and not hypoxic. His respiratory rate is 31 somewhat shallow he has audible wheezing he is provided with DuoNeb's and under 25 mg Solu-Medrol in the emergency department. He reports breathing easier at the time of admission  Respiratory distress/ COPD exacerbation - cont Solumedrol, schedule Nebs, Flutter valve, Mucinex - non-compliant at home, he will go to SNF where he can have his meds given regularly - Continue prednisone 40 mg daily for 1 week then discontinue   Hypertension -  Stable, controlled.    PAF -CHA2DS2-VASc Score 4 - Coumadin for anticoagulation, will need to monitor PT/INR daily for 1 week, and adjust warfarin doses as needed.  - He will discharge on warfarin 2 mg daily to start 10/29/16 - Cardiology started him on amiodarone 8/17 400 mg daily for 1 week, then 200 mg daily after that.  Monitor PT/INR closely.  Follow up with Dr. Anne Fu in 2 weeks.      PVCs - asymptomatic, lytes ok.    Chronic diastolic heart failure  - daily  weights and I and Os   Chronic kidney disease (CKD) stage 4 - stable   GERD (gastroesophageal reflux disease) - protonix given in hospital    Hypothyroidism - continue synthroid  Deconditioned - SNF recommended  DVT prophylaxis:   Coumadin Code Status: Full code Family Communication:  Disposition Plan: SNF Consultants: cardiology  Discharge Diagnoses:  Principal Problem:   Respiratory distress Active Problems:   Atrial fibrillation with RVR (HCC)   Hypertension   Hypothyroidism   COPD exacerbation (HCC)   Chronic anticoagulation   Chronic diastolic heart failure (HCC)   Dysphagia   GERD (gastroesophageal reflux disease)   BRBPR (bright red blood per rectum)   CKD (chronic kidney disease) stage 4, GFR 15-29 ml/min (HCC)  Discharge Instructions:  Allergies as of 10/27/2016      Reactions   Diltiazem Hcl Er Beads Other (See Comments)   Tachycardia   Metoprolol Succinate Er [metoprolol Tartrate] Other (See Comments)   hallucinations      Medication List    TAKE these medications   acetaminophen 650 MG CR tablet Commonly known as:  TYLENOL Take 650-1,300 mg by mouth See admin instructions. 650mg  in am, 1300mg  in pm   albuterol 108 (90 Base) MCG/ACT inhaler Commonly known as:  PROVENTIL HFA;VENTOLIN HFA Inhale 2 puffs into the lungs every 6 (six) hours as needed for wheezing or shortness of breath.   amiodarone 400 MG tablet Commonly known as:  PACERONE Take 2 tabs daily for 1 week, then 1 tab po daily   ipratropium-albuterol 0.5-2.5 (3) MG/3ML Soln Commonly known as:  DUONEB Take 3 mLs by nebulization every 8 (eight) hours.   latanoprost 0.005 % ophthalmic solution Commonly known as:  XALATAN Place 1 drop into both eyes at bedtime. For glaucoma   levothyroxine 50 MCG tablet Commonly known as:  SYNTHROID, LEVOTHROID Take 50 mcg by mouth daily before breakfast.   pantoprazole 40 MG tablet Commonly known as:  PROTONIX Take 1 tablet (40 mg total) by mouth daily.   predniSONE 20 MG tablet Commonly known as:  DELTASONE Take 2 tablets (40 mg total) by mouth daily with breakfast.   senna-docusate 8.6-50 MG tablet Commonly known as:  Senokot-S Take 1 tablet by mouth daily.   warfarin 2 MG  tablet Commonly known as:  COUMADIN Take 1 tablet (2 mg total) by mouth daily at 6 PM. Start taking on:  10/29/2016 What changed:  medication strength  how much to take  how to take this  when to take this  additional instructions  These instructions start on 10/29/2016. If you are unsure what to do until then, ask your doctor or other care provider.       Contact information for follow-up providers    Jake Bathe, MD. Schedule an appointment as soon as possible for a visit in 2 week(s).   Specialty:  Cardiology Why:  hospital follow up  Contact information: [address] [place] [address] [phone]            Contact information for after-discharge care    Destination    [facility] Follow up.   Specialty:  Skilled Nursing Facility Contact information: [address] [place] [address] [phone]                 Allergies  Allergen Reactions  . Diltiazem Hcl Er Beads Other (See Comments)    Tachycardia  . Metoprolol Succinate Er [Metoprolol Tartrate] Other (See Comments)  hallucinations   Current Discharge Medication List    START taking these medications   Details  amiodarone (PACERONE) 400 MG tablet Take 2 tabs daily for 1 week, then 1 tab po daily    ipratropium-albuterol (DUONEB) 0.5-2.5 (3) MG/3ML SOLN Take 3 mLs by nebulization every 8 (eight) hours. Qty: 360 mL    predniSONE (DELTASONE) 20 MG tablet Take 2 tablets (40 mg total) by mouth daily with breakfast.      CONTINUE these medications which have CHANGED   Details  senna-docusate (SENOKOT-S) 8.6-50 MG tablet Take 1 tablet by mouth daily.    warfarin (COUMADIN) 2 MG tablet Take 1 tablet (2 mg total) by mouth daily at 6 PM.      CONTINUE these medications which have NOT CHANGED   Details  acetaminophen (TYLENOL) 650 MG CR tablet Take 650-1,300 mg by mouth See admin instructions. 650mg  in am, 1300mg  in pm     albuterol (PROVENTIL HFA;VENTOLIN HFA) 108 (90 Base) MCG/ACT inhaler Inhale 2 puffs into the lungs every 6 (six) hours as needed for wheezing or shortness of breath.    latanoprost (XALATAN) 0.005 % ophthalmic solution Place 1 drop into both eyes at bedtime. For glaucoma    levothyroxine (SYNTHROID, LEVOTHROID) 50 MCG tablet Take 50 mcg by mouth daily before breakfast.    pantoprazole (PROTONIX) 40 MG tablet Take 1 tablet (40 mg total) by mouth daily.        Procedures/Studies: Dg Chest Portable 1 View  Result Date: 10/23/2016 CLINICAL DATA:  Progressive weakness over last several weeks, today found by members in a blood-stained chair while eating breakfast, history COPD, hypertension, GERD, CHF EXAM: PORTABLE CHEST 1 VIEW COMPARISON:  Portable exam 1216 hours compared to 07/16/2016 FINDINGS: Normal heart size, mediastinal contours, and pulmonary vascularity. Chronic interstitial changes at lung bases greater on RIGHT, stable. Underlying emphysematous changes. No acute infiltrate, pleural effusion or pneumothorax. Bones demineralized. IMPRESSION: Emphysematous and basilar chronic interstitial changes. No acute abnormalities. Electronically Signed   By: Ulyses Southward M.D.   On: 10/23/2016 12:26     Subjective: Pt says he is breathing much better today.  His Afib RVR has resolved and he is back in sinus rhythm now.    Discharge Exam: Vitals:   10/27/16 0740 10/27/16 1300  BP:  (!) 130/46  Pulse:  91  Resp:  (!) 23  Temp:  98.2 F (36.8 C)  SpO2: 94% 96%   Vitals:   10/27/16 0245 10/27/16 0422 10/27/16 0740 10/27/16 1300  BP:  (!) 144/84  (!) 130/46  Pulse:  90  91  Resp:  20  (!) 23  Temp:  97.6 F (36.4 C)  98.2 F (36.8 C)  TempSrc:  Oral  Axillary  SpO2: 98% 94% 94% 96%  Weight:      Height:       General: Pt is alert, awake, not in acute distress Cardiovascular:  Normal S1/S2 +, no rubs, no gallops Respiratory: much better air movement.  Abdominal: Soft, NT, ND, bowel  sounds + Extremities: no edema, no cyanosis   The results of significant diagnostics from this hospitalization (including imaging, microbiology, ancillary and laboratory) are listed below for reference.     Microbiology: No results found for this or any previous visit (from the past 240 hour(s)).   Labs: BNP (last 3 results)  Recent Labs  10/23/16 1200  BNP 125.1*   Basic Metabolic Panel:  Recent Labs Lab 10/23/16 1200 10/23/16 1502 10/24/16 0229 10/26/16 1113 10/27/16 0419  NA 141 144 142 142 141  K 4.0 4.0 4.2 4.4 4.0  CL 112* 110 111 114* 111  CO2 21*  --  23 17* 23  GLUCOSE 115* 97 150* 201* 150*  BUN 31* 40* 29* 46* 46*  CREATININE 1.85* 1.60* 1.86* 1.79* 1.77*  CALCIUM 8.4*  --  8.7* 8.2* 8.3*  MG  --   --   --  2.0 2.0   Liver Function Tests:  Recent Labs Lab 10/23/16 1200  AST 21  ALT 11*  ALKPHOS 106  BILITOT 0.6  PROT 5.8*  ALBUMIN 3.3*   No results for input(s): LIPASE, AMYLASE in the last 168 hours. No results for input(s): AMMONIA in the last 168 hours. CBC:  Recent Labs Lab 10/23/16 1200 10/23/16 1502 10/23/16 1645 10/23/16 2156 10/24/16 0229  WBC 9.3  --   --  7.4 10.7*  NEUTROABS 5.1  --   --   --   --   HGB 13.7 13.3  --  13.2 13.0  HCT 39.5 39.0 41.3 38.3* 37.7*  MCV 98.0  --   --  96.0 95.7  PLT 204  --   --  218 220   Cardiac Enzymes: No results for input(s): CKTOTAL, CKMB, CKMBINDEX, TROPONINI in the last 168 hours. BNP: Invalid input(s): POCBNP CBG:  Recent Labs Lab 10/25/16 0748 10/25/16 1130  GLUCAP 162* 130*   D-Dimer No results for input(s): DDIMER in the last 72 hours. Hgb A1c No results for input(s): HGBA1C in the last 72 hours. Lipid Profile No results for input(s): CHOL, HDL, LDLCALC, TRIG, CHOLHDL, LDLDIRECT in the last 72 hours. Thyroid function studies No results for input(s): TSH, T4TOTAL, T3FREE, THYROIDAB in the last 72 hours.  Invalid input(s): FREET3 Anemia work up No results for input(s):  VITAMINB12, FOLATE, FERRITIN, TIBC, IRON, RETICCTPCT in the last 72 hours. Urinalysis    Component Value Date/Time   COLORURINE YELLOW 10/23/2016 1344   APPEARANCEUR CLEAR 10/23/2016 1344   LABSPEC 1.027 10/23/2016 1344   PHURINE 5.0 10/23/2016 1344   GLUCOSEU NEGATIVE 10/23/2016 1344   HGBUR NEGATIVE 10/23/2016 1344   BILIRUBINUR NEGATIVE 10/23/2016 1344   KETONESUR NEGATIVE 10/23/2016 1344   PROTEINUR 30 (A) 10/23/2016 1344   UROBILINOGEN 0.2 07/10/2013 1520   NITRITE NEGATIVE 10/23/2016 1344   LEUKOCYTESUR NEGATIVE 10/23/2016 1344   Sepsis Labs Invalid input(s): PROCALCITONIN,  WBC,  LACTICIDVEN Microbiology No results found for this or any previous visit (from the past 240 hour(s)).  Time coordinating discharge: 35 mins  SIGNED:  Standley Dakins, MD  [facility] 10/27/2016, 3:32 PM Pager [phone]  If 7PM-7AM, please contact night-coverage [email or web address] Password [email or web address]

## 2016-10-27 NOTE — Clinical Social Work Placement (Signed)
   CLINICAL SOCIAL WORK PLACEMENT  NOTE  Date:  10/27/2016  Patient Details  Name: Jordan [name] MRN: [id number] Date of Birth: 05/08/1918  Clinical Social Work is seeking post-discharge placement for this patient at the Skilled  Nursing Facility level of care (*CSW will initial, date and re-position this form in  chart as items are completed):  Yes   Patient/family provided with [place] Clinical Social Work Department's list of facilities offering this level of care within the geographic area requested by the patient (or if unable, by the patient's family).  Yes   Patient/family informed of their freedom to choose among providers that offer the needed level of care, that participate in Medicare, Medicaid or managed care program needed by the patient, have an available bed and are willing to accept the patient.  Yes   Patient/family informed of [place] [facility]'s ownership interest in [facility] and [facility], as well as of the fact that they are under no obligation to receive care at these facilities.  PASRR submitted to EDS on       PASRR number received on       Existing PASRR number confirmed on 10/25/16     FL2 transmitted to all facilities in geographic area requested by pt/family on 10/25/16     FL2 transmitted to all facilities within larger geographic area on       Patient informed that his/her managed care company has contracts with or will negotiate with certain facilities, including the following:  [facility]         Patient/family informed of bed offers received.  Patient chooses bed at [facility]     Physician recommends and patient chooses bed at      Patient to be transferred to [facility] on 10/27/16.  Patient to be transferred to facility by [facility]     Patient family notified on 10/27/16 of transfer.  Name of family member notified:  [name], daughter     PHYSICIAN Please prepare priority discharge  summary, including medications, Please prepare prescriptions     Additional Comment:    _______________________________________________ Abigail Butts, LCSW 10/27/2016, 3:11 PM

## 2016-10-27 NOTE — Consult Note (Signed)
CONSULTATION NOTE   Patient Name: Jordan [name] Date of Encounter: 10/27/2016 Cardiologist: [facility] Problem List   Principal Problem:   Respiratory distress Active Problems:   Hypertension   Hypothyroidism   Atrial fibrillation with RVR (HCC)   COPD exacerbation (HCC)   Chronic anticoagulation   Chronic diastolic heart failure (HCC)   Dysphagia   GERD (gastroesophageal reflux disease)   BRBPR (bright red blood per rectum)   CKD (chronic kidney disease) stage 4, GFR 15-29 ml/min (HCC)    HPI   Jordan [name] is a 83 y.o. male who is being seen today for the evaluation of atrial fibrillation at the request of Dr. Wynetta [name]. This is a pleasant patient of Dr. Marlou [name] with a history of PAF - now presents with respiratory distress and fatigue. He was thought to have COPD exacerbation but has been noted to have intermittent A. fib with RVR. He's been reportedly intolerant to beta blockers and calcium channel blockers in the past. He is anticoagulated on warfarin. He denies any chest pain. Cardiology was consulted regarding recommendations for management of A. fib with RVR. Patient was given low-dose digoxin today for rate control and appears to be in sinus rhythm with PVCs.  Creatinine is noted to be elevated at 1.77, which is around baseline and a GFR of 31 given his age.  PMHx   Past Medical History:  Diagnosis Date  . Acute blood loss anemia   . CHF (congestive heart failure) ([place])   . Chronic diastolic heart failure ([place])   . CKD (chronic kidney disease) stage 3, GFR 30-59 ml/min   . Constipation   . COPD (chronic obstructive pulmonary disease) ([place])   . Dyspnea   . Ectropion   . Family history of adverse reaction to anesthesia    " daughter has difficulty waking "  . GERD (gastroesophageal reflux disease)   . Hypertension   . Intertrochanteric fracture of left hip ([place])   . Leukocytosis   . Paroxysmal atrial fibrillation (HCC)   .  Physical deconditioning   . PVC (premature ventricular contraction)   . Thyroid disease   . Ventricular bigeminy     Past Surgical History:  Procedure Laterality Date  . CHOLECYSTECTOMY    . ESOPHAGOGASTRODUODENOSCOPY N/A 04/26/2013   Procedure: ESOPHAGOGASTRODUODENOSCOPY (EGD);  Surgeon: Cleotis Nipper, MD;  Location: [facility] ENDOSCOPY;  Service: Endoscopy;  Laterality: N/A;  . ESOPHAGOGASTRODUODENOSCOPY (EGD) WITH ESOPHAGEAL DILATION N/A 04/26/2013   Procedure: ESOPHAGOGASTRODUODENOSCOPY (EGD) WITH ESOPHAGEAL DILATION;  Surgeon: Cleotis Nipper, MD;  Location: [facility];  Service: Endoscopy;  Laterality: N/A;  . HIP ARTHROPLASTY Right 04/25/2013   Procedure: ARTHROPLASTY BIPOLAR HIP;  Surgeon: Kerin Salen, MD;  Location: [facility];  Service: Orthopedics;  Laterality: Right;  . HIP ARTHROPLASTY Left 05/07/2015   Procedure: LEFT HEMI-HIP ARTHROPLASTY CEMENTED;  Surgeon: Frederik Pear, MD;  Location: [place];  Service: Orthopedics;  Laterality: Left;  . SPLENECTOMY, PARTIAL      FAMHx   Family History  Problem Relation Age of Onset  . Hypertension Father     SOCHx    reports that he has quit smoking. He has never used smokeless tobacco. He reports that he does not drink alcohol or use drugs.  Outpatient Medications   No current facility-administered medications on file prior to encounter.    Current Outpatient Prescriptions on File Prior to Encounter  Medication Sig Dispense Refill  . albuterol (PROVENTIL HFA;VENTOLIN HFA) 108 (90  Base) MCG/ACT inhaler Inhale 2 puffs into the lungs every 6 (six) hours as needed for wheezing or shortness of breath.    . latanoprost (XALATAN) 0.005 % ophthalmic solution Place 1 drop into both eyes at bedtime. For glaucoma    . levothyroxine (SYNTHROID, LEVOTHROID) 50 MCG tablet Take 50 mcg by mouth daily before breakfast.    . pantoprazole (PROTONIX) 40 MG tablet Take 1 tablet (40 mg total) by mouth daily.    Marland Kitchen senna-docusate (SENOKOT-S) 8.6-50 MG tablet  Take 1 tablet by mouth 2 (two) times daily. (Patient taking differently: Take 1 tablet by mouth daily. )    . warfarin (COUMADIN) 2.5 MG tablet Take as directed by coumadin clinic (Patient taking differently: Take 2.5-3.75 mg by mouth daily. Take 2.5 mg on Sun / Tues / Thurs / Sat  Take 3.75 mg on  Mon / Wed / Fri) 40 tablet 3    Inpatient Medications    Scheduled Meds: . guaiFENesin  600 mg Oral BID  . latanoprost  1 drop Both Eyes QHS  . levothyroxine  50 mcg Oral QAC breakfast  . methylPREDNISolone (SOLU-MEDROL) injection  60 mg Intravenous Q12H  . pantoprazole  40 mg Oral Daily  . senna-docusate  1 tablet Oral Daily  . warfarin  0.5 mg Oral ONCE-1800  . Warfarin - Pharmacist Dosing Inpatient   Does not apply q1800    Continuous Infusions:   PRN Meds: acetaminophen **OR** acetaminophen, digoxin, ipratropium-albuterol   ALLERGIES   Allergies  Allergen Reactions  . Diltiazem Hcl Er Beads Other (See Comments)    Tachycardia  . Metoprolol Succinate Er [Metoprolol Tartrate] Other (See Comments)    hallucinations    ROS   Pertinent items noted in HPI and remainder of comprehensive ROS otherwise negative.  Vitals   Vitals:   10/27/16 0245 10/27/16 0422 10/27/16 0740 10/27/16 1300  BP:  (!) 144/84  (!) 130/46  Pulse:  90  91  Resp:  20  (!) 23  Temp:  97.6 F (36.4 C)  98.2 F (36.8 C)  TempSrc:  Oral  Axillary  SpO2: 98% 94% 94% 96%  Weight:      Height:        Intake/Output Summary (Last 24 hours) at 10/27/16 1429 Last data filed at 10/27/16 0900  Gross per 24 hour  Intake              360 ml  Output              150 ml  Net              210 ml   Filed Weights   10/24/16 0350 10/25/16 0506 10/26/16 0417  Weight: 170 lb 3.1 oz (77.2 kg) 171 lb 11.8 oz (77.9 kg) 165 lb 2 oz (74.9 kg)    Physical Exam   General appearance: alert, appears stated age and no distress Neck: no carotid bruit, no JVD and thyroid not enlarged, symmetric, no  tenderness/mass/nodules Lungs: diminished breath sounds bibasilar Heart: regular rate and rhythm and Occasional skipped beats Abdomen: soft, non-tender; bowel sounds normal; no masses,  no organomegaly Extremities: extremities normal, atraumatic, no cyanosis or edema Pulses: 2+ and symmetric Skin: Skin color, texture, turgor normal. No rashes or lesions Neurologic: Grossly normal Psych: Pleasant  Labs   Results for orders placed or performed during the hospital encounter of 10/23/16 (from the past 48 hour(s))  Protime-INR     Status: Abnormal   Collection Time: 10/26/16  3:34  AM  Result Value Ref Range   Prothrombin Time 26.4 (H) 11.4 - 15.2 seconds   INR 6.54   Basic metabolic panel     Status: Abnormal   Collection Time: 10/26/16 11:13 AM  Result Value Ref Range   Sodium 142 135 - 145 mmol/L   Potassium 4.4 3.5 - 5.1 mmol/L    Comment: SPECIMEN HEMOLYZED. HEMOLYSIS MAY AFFECT INTEGRITY OF RESULTS.   Chloride 114 (H) 101 - 111 mmol/L   CO2 17 (L) 22 - 32 mmol/L   Glucose, Bld 201 (H) 65 - 99 mg/dL   BUN 46 (H) 6 - 20 mg/dL   Creatinine, Ser 1.79 (H) 0.61 - 1.24 mg/dL   Calcium 8.2 (L) 8.9 - 10.3 mg/dL   GFR calc non Af Amer 30 (L) >60 mL/min   GFR calc Af Amer 35 (L) >60 mL/min    Comment: (NOTE) The eGFR has been calculated using the CKD EPI equation. This calculation has not been validated in all clinical situations. eGFR's persistently <60 mL/min signify possible Chronic Kidney Disease.    Anion gap 11 5 - 15  Magnesium     Status: None   Collection Time: 10/26/16 11:13 AM  Result Value Ref Range   Magnesium 2.0 1.7 - 2.4 mg/dL  Protime-INR     Status: Abnormal   Collection Time: 10/27/16  4:19 AM  Result Value Ref Range   Prothrombin Time 33.1 (H) 11.4 - 15.2 seconds   INR 6.50   Basic metabolic panel     Status: Abnormal   Collection Time: 10/27/16  4:19 AM  Result Value Ref Range   Sodium 141 135 - 145 mmol/L   Potassium 4.0 3.5 - 5.1 mmol/L   Chloride 111  101 - 111 mmol/L   CO2 23 22 - 32 mmol/L   Glucose, Bld 150 (H) 65 - 99 mg/dL   BUN 46 (H) 6 - 20 mg/dL   Creatinine, Ser 1.77 (H) 0.61 - 1.24 mg/dL   Calcium 8.3 (L) 8.9 - 10.3 mg/dL   GFR calc non Af Amer 31 (L) >60 mL/min   GFR calc Af Amer 36 (L) >60 mL/min    Comment: (NOTE) The eGFR has been calculated using the CKD EPI equation. This calculation has not been validated in all clinical situations. eGFR's persistently <60 mL/min signify possible Chronic Kidney Disease.    Anion gap 7 5 - 15  Magnesium     Status: None   Collection Time: 10/27/16  4:19 AM  Result Value Ref Range   Magnesium 2.0 1.7 - 2.4 mg/dL    ECG    N/A -  Personally Reviewed  Telemetry   NSR with frequent PVC's - Personally Reviewed  Radiology   No results found.  Cardiac Studies   N/A  Impression   1. Principal Problem: 2.   Respiratory distress 3. Active Problems: 4.   Hypertension 5.   Hypothyroidism 6.   Atrial fibrillation with RVR ([place]) 7.   COPD exacerbation ([place]) 8.   Chronic anticoagulation 9.   Chronic diastolic heart failure ([place]) 10.   Dysphagia 11.   GERD (gastroesophageal reflux disease) 12.   BRBPR (bright red blood per rectum) 13.   CKD (chronic kidney disease) stage 4, GFR 15-29 ml/min (HCC) 14.   Recommendation   1. Jordan [name] has intermittent atrial fibrillation with RVR - he has been fatigued and it may be related to this. He has apparently been intolerant of b-blockers and CCB's in the past.  Was given digoxin, however, I would advise against this given the narrow therapeutic window of the drug and his advanced age and decreased renal function. Will start amiodarone 400 mg daily and plan decrease to 200 mg daily after 1 week. INR will need to be monitored as amiodarone can raise warfarin levels by 50%, therefore the dose of warfarin should be reduced.  Can follow-up with Dr. Marlou [name] after discharge.  Thanks for the consultation.  Time Spent Directly with  Patient:  I have spent a total of 35 minutes with the patient reviewing hospital notes, telemetry, EKGs, labs and examining the patient as well as establishing an assessment and plan that was discussed personally with the patient. > 50% of time was spent in direct patient care.  Length of Stay:  LOS: 3 days   Pixie Casino, MD, [place]  Attending Cardiologist  Direct Dial: [phone]  Fax: [phone]  Website: [email or web address] 10/27/2016, 2:29 PM

## 2016-10-27 NOTE — Progress Notes (Signed)
Pt is leaving today to go to [facility]. Report given to [facility], LPN. All questions were answered.  Colleen Can, RN

## 2016-10-27 NOTE — Progress Notes (Signed)
ANTICOAGULATION CONSULT NOTE   Pharmacy Consult:  Coumadin Indication: atrial fibrillation  Allergies  Allergen Reactions  . Diltiazem Hcl Er Beads Other (See Comments)    Tachycardia  . Metoprolol Succinate Er [Metoprolol Tartrate] Other (See Comments)    hallucinations     Vital Signs: Temp: 97.6 F (36.4 C) (08/17 0422) Temp Source: Oral (08/17 0422) BP: 144/84 (08/17 0422) Pulse Rate: 90 (08/17 0422)  Labs:  Recent Labs  10/25/16 0513 10/26/16 0334 10/26/16 1113 10/27/16 0419  LABPROT 25.1* 26.4*  --  33.1*  INR 2.23 2.37  --  3.15  CREATININE  --   --  1.79* 1.77*    Estimated Creatinine Clearance: 23.6 mL/min (A) (by C-G formula based on SCr of 1.77 mg/dL (H)).    Assessment: 89 YOM on Coumadin PTA for Afib presented to the ED with SOB and complaint of BRBPR. In ED, FOBT negative and CBC stable.  Patient was deemed to have low suspicion for active GI bleed and BRBPR is likely from internal hemorrhoids/skin breakdown.  Patient missed 10/23/16 dose.  INR increased to supra-therapeutic level this AM.  Unsure why other than home dose is likely too much.  Patient is eating 50-75% of his meals.  No bleeding reported.  PTA warfarin dose: 2.5 mg daily except 3.75 mg on MWF   Goal of Therapy:  INR 2-3    Plan:  Coumadin 0.5mg  PO today.  Likely need a smaller dose than home dose at discharge. Daily PT / INR    Ilir Mahrt D. Laney Potash, PharmD, BCPS Pager:  [phone] 10/27/2016, 9:56 AM

## 2016-10-27 NOTE — Progress Notes (Signed)
Patient converted to Afib with RVR, confirmed with EKG. Rates in the 120-130's. Due to allergies, digoxin IVP ordered. After medication given, patient converted back into SR in the 80's with PVCs.

## 2016-10-27 NOTE — Progress Notes (Signed)
Patient will discharge to [facility]. Anticipated discharge date: 10/27/16 Family notified: [name], daughter Transportation by: PTAR  Nurse to call report to [phone].    CSW signing off.  Abigail Butts, LCSWA  Clinical Social Worker

## 2016-11-02 ENCOUNTER — Telehealth: Payer: Self-pay

## 2016-11-02 NOTE — Telephone Encounter (Signed)
Pt's daughter called states pt is currently in [facility], cancelled appt on 11/06/16, they are monitoring her Coumadin while he is residing there.  Advised pt's daughter to call us once he has been discharged so we can arrange appropriate follow-up INR monitoring and management.  Pt's daughter verbalized understanding.

## 2016-11-09 ENCOUNTER — Encounter: Payer: Self-pay | Admitting: Cardiology

## 2016-11-09 ENCOUNTER — Ambulatory Visit (INDEPENDENT_AMBULATORY_CARE_PROVIDER_SITE_OTHER): Payer: Medicare Other | Admitting: Cardiology

## 2016-11-09 VITALS — BP 130/68 | HR 72 | Ht 68.0 in | Wt 183.4 lb

## 2016-11-09 DIAGNOSIS — I480 Paroxysmal atrial fibrillation: Secondary | ICD-10-CM

## 2016-11-09 DIAGNOSIS — I5032 Chronic diastolic (congestive) heart failure: Secondary | ICD-10-CM | POA: Diagnosis not present

## 2016-11-09 DIAGNOSIS — I4891 Unspecified atrial fibrillation: Secondary | ICD-10-CM

## 2016-11-09 DIAGNOSIS — J441 Chronic obstructive pulmonary disease with (acute) exacerbation: Secondary | ICD-10-CM

## 2016-11-09 MED ORDER — AMIODARONE HCL 200 MG PO TABS
200.0000 mg | ORAL_TABLET | Freq: Every day | ORAL | 3 refills | Status: DC
Start: 2016-11-09 — End: 2016-12-01

## 2016-11-09 NOTE — Patient Instructions (Signed)
Medication Instructions:  Please decrease Amiodarone to 200 mg a day. Continue all other medications as listed.  Follow-Up: Follow up in 2 months with APP  If you need a refill on your cardiac medications before your next appointment, please call your pharmacy.  Thank you for choosing [facility]!!

## 2016-11-09 NOTE — Progress Notes (Signed)
[address] [place], [place]  [address] Phone: [phone] Fax:  [phone]  Date:  11/09/2016   ID:  Jordan [name], DOB [birth year], MRN [id number]  PCP:  Kaleen Mask, MD   History of Present Illness: Jordan [name] is a 83 y.o. male with paroxysmal atrial fibrillation with hospitalization on 12/16/12 with diastolic heart failure, 10/27/16 hospitalized again with COPD exacerbation and transient A. fib with RVR. In the past he has been intolerant of beta blockers and calcium channel blockers. Amiodarone was administered in the hospital setting. Warfarin needed to be adjusted. Digoxin was not used because of advanced age and narrow therapeutic window. He was seen in consultation by Dr. Rennis Golden.   In the past he had had very frequent PVCs, ventricular bigeminy with symptomatic bradycardia, had to stop flecainide and atenolol.  PVCs 28,000 and on 24-hour Holter monitor 8/12, normal ejection fraction with chronic dyspnea, cough, wheeze.   He saw Tereso Newcomer on 07/14/13 after hospitalization 4/30 through 5/1 after a witnessed syncopal episode. He had sensation of lightheadedness prior to the episode. Heart rate was reportedly in the 30s and his atenolol was stopped. His heart rate improved to greater than 16 was discharged home.  He was sitting at church and suddenly felt lightheaded, next recollection was rescue workers helping him out. He had another episode of syncope on the way to the hospital. Has felt well since stopping beta blocker.  In February of 2015 broke his hip and he works with a walker.  In Feb 2017 broke other hip. This is unfortunate. No syncope.  He denies any shortness of breath, chest pain, palpitations.  Tumor left ankle.   In the past when he was first diagnosed with paroxysmal atrial fibrillation, Dr. Johney Frame saw him in consultation and recommended flecainide, low-dose to see if this would help with both his atrial fibrillation as well as  PVC burden, however he did not tolerate these medications because of his significant bradycardia.   Of course, since amiodarone was started, we're concerned about the possibility of bradycardia developing.    Wt Readings from Last 3 Encounters:  11/09/16 183 lb 6.4 oz (83.2 kg)  10/26/16 165 lb 2 oz (74.9 kg)  07/17/16 184 lb 14.4 oz (83.9 kg)     Past Medical History:  Diagnosis Date  . Acute blood loss anemia   . CHF (congestive heart failure) (HCC)   . Chronic diastolic heart failure (HCC)   . CKD (chronic kidney disease) stage 3, GFR 30-59 ml/min   . Constipation   . COPD (chronic obstructive pulmonary disease) (HCC)   . Dyspnea   . Ectropion   . Family history of adverse reaction to anesthesia    " daughter has difficulty waking "  . GERD (gastroesophageal reflux disease)   . Hypertension   . Intertrochanteric fracture of left hip (HCC)   . Leukocytosis   . Paroxysmal atrial fibrillation (HCC)   . Physical deconditioning   . PVC (premature ventricular contraction)   . Thyroid disease   . Ventricular bigeminy     Past Surgical History:  Procedure Laterality Date  . CHOLECYSTECTOMY    . ESOPHAGOGASTRODUODENOSCOPY N/A 04/26/2013   Procedure: ESOPHAGOGASTRODUODENOSCOPY (EGD);  Surgeon: Florencia Reasons, MD;  Location: [facility] ENDOSCOPY;  Service: Endoscopy;  Laterality: N/A;  . ESOPHAGOGASTRODUODENOSCOPY (EGD) WITH ESOPHAGEAL DILATION N/A 04/26/2013   Procedure: ESOPHAGOGASTRODUODENOSCOPY (EGD) WITH ESOPHAGEAL DILATION;  Surgeon: Florencia Reasons, MD;  Location: [facility]  ENDOSCOPY;  Service: Endoscopy;  Laterality: N/A;  . HIP ARTHROPLASTY Right 04/25/2013   Procedure: ARTHROPLASTY BIPOLAR HIP;  Surgeon: Nestor Lewandowsky, MD;  Location: [facility] OR;  Service: Orthopedics;  Laterality: Right;  . HIP ARTHROPLASTY Left 05/07/2015   Procedure: LEFT HEMI-HIP ARTHROPLASTY CEMENTED;  Surgeon: Gean Birchwood, MD;  Location: [facility] OR;  Service: Orthopedics;  Laterality: Left;  . SPLENECTOMY, PARTIAL       Current Outpatient Prescriptions  Medication Sig Dispense Refill  . acetaminophen (TYLENOL) 650 MG CR tablet Take 650-1,300 mg by mouth See admin instructions. 650mg  in am, 1300mg  in pm    . albuterol (PROVENTIL HFA;VENTOLIN HFA) 108 (90 Base) MCG/ACT inhaler Inhale 2 puffs into the lungs every 6 (six) hours as needed for wheezing or shortness of breath.    Marland Kitchen amiodarone (PACERONE) 200 MG tablet Take 1 tablet (200 mg total) by mouth daily. 90 tablet 3  . ipratropium-albuterol (DUONEB) 0.5-2.5 (3) MG/3ML SOLN Take 3 mLs by nebulization every 8 (eight) hours. 360 mL   . latanoprost (XALATAN) 0.005 % ophthalmic solution Place 1 drop into both eyes at bedtime. For glaucoma    . levothyroxine (SYNTHROID, LEVOTHROID) 50 MCG tablet Take 50 mcg by mouth daily before breakfast.    . pantoprazole (PROTONIX) 40 MG tablet Take 1 tablet (40 mg total) by mouth daily.    Marland Kitchen senna-docusate (SENOKOT-S) 8.6-50 MG tablet Take 1 tablet by mouth daily.    Marland Kitchen warfarin (COUMADIN) 2 MG tablet Take 1 tablet (2 mg total) by mouth daily at 6 PM.     No current facility-administered medications for this visit.     Allergies:    Allergies  Allergen Reactions  . Diltiazem Hcl Er Beads Other (See Comments)    Tachycardia  . Metoprolol Succinate Er [Metoprolol Tartrate] Other (See Comments)    hallucinations    Social History:  The patient  reports that he has quit smoking. He has never used smokeless tobacco. He reports that he does not drink alcohol or use drugs.   ROS:  Please see the history of present illness.   Chronic diarrhea. Positive for dyspnea. No chest pain. No syncope. Fatigue. No bleeding.   All other systems reviewed and negative.   PHYSICAL EXAM: VS:  BP 130/68   Pulse 72   Ht 5\' 8"  (1.727 m)   Wt 183 lb 6.4 oz (83.2 kg)   BMI 27.89 kg/m  Elderly Well nourished, well developed, in no acute distress HEENT: normal Neck: no JVD Cardiac:  normal S1, S2; RRR; rare ectopy, no murmur Lungs:   clear to auscultation bilaterally, occasional scattered wheezing, no rhonchi or rales Abd: soft, nontender, no hepatomegaly Ext: Left leg edema, ankle tumor.  Skin: warm and dry Neuro: no focal abnormalities noted  EKG:  EKG-10/29/14-sinus rhythm, 73, PVC noted As recent hospital EKG with sinus rhythm and PVCs. No further atrial fibrillation.     Event monitor-: 07/18/13 episode of atrial fibrillation with heart rate of 130 at 7:40 AM, transient. This is while being off of flecainide and atenolol.  ASSESSMENT AND PLAN:  Paroxysmal atrial fibrillation with rapid ventricular response  - He was started on amiodarone in the hospital setting in 10/27/16  - Remember, he did have significant bradycardia in the past with flecainide and atenolol previously initiated by Dr. Johney Frame  - We will be careful.  - I will decrease his amiodarone to 200 mg once a day. He is currently in sinus rhythm.  Chronic anticoagulation  - Warfarin  adjustment secondary to amiodarone. Cost of Eliquis was too high for him.  History of syncope  - Questionable, at one point he thought he may have just gone to sleep.  Chronic diastolic heart failure  - In hospital 1. had elevated creatinine, avoiding NSAIDs trying not to over diurese. Chronic leg edema noted.  COPD  - 50 pack year smoker  - Chronic shortness of breath      Signed, Donato SchultzMark Jazzlene Huot, MD [facility] ClinicFACC  11/09/2016 1:52 PM

## 2016-11-23 ENCOUNTER — Ambulatory Visit (INDEPENDENT_AMBULATORY_CARE_PROVIDER_SITE_OTHER): Payer: Medicare Other | Admitting: Internal Medicine

## 2016-11-23 DIAGNOSIS — Z5181 Encounter for therapeutic drug level monitoring: Secondary | ICD-10-CM

## 2016-11-23 DIAGNOSIS — I4891 Unspecified atrial fibrillation: Secondary | ICD-10-CM | POA: Diagnosis not present

## 2016-11-23 LAB — POCT INR: INR: 1.6

## 2016-11-26 ENCOUNTER — Inpatient Hospital Stay (HOSPITAL_COMMUNITY)
Admission: EM | Admit: 2016-11-26 | Discharge: 2016-12-01 | DRG: 682 | Disposition: A | Discharge status: Hospice | Payer: Medicare Other | Attending: Internal Medicine | Admitting: Internal Medicine

## 2016-11-26 ENCOUNTER — Emergency Department (HOSPITAL_COMMUNITY): Payer: Medicare Other

## 2016-11-26 ENCOUNTER — Encounter (HOSPITAL_COMMUNITY): Payer: Self-pay | Admitting: Emergency Medicine

## 2016-11-26 DIAGNOSIS — Z888 Allergy status to other drugs, medicaments and biological substances status: Secondary | ICD-10-CM

## 2016-11-26 DIAGNOSIS — S5002XA Contusion of left elbow, initial encounter: Secondary | ICD-10-CM | POA: Diagnosis present

## 2016-11-26 DIAGNOSIS — Z87891 Personal history of nicotine dependence: Secondary | ICD-10-CM

## 2016-11-26 DIAGNOSIS — R1310 Dysphagia, unspecified: Secondary | ICD-10-CM | POA: Diagnosis present

## 2016-11-26 DIAGNOSIS — N183 Chronic kidney disease, stage 3 (moderate): Secondary | ICD-10-CM | POA: Diagnosis present

## 2016-11-26 DIAGNOSIS — N189 Chronic kidney disease, unspecified: Secondary | ICD-10-CM

## 2016-11-26 DIAGNOSIS — I5032 Chronic diastolic (congestive) heart failure: Secondary | ICD-10-CM | POA: Diagnosis not present

## 2016-11-26 DIAGNOSIS — R296 Repeated falls: Secondary | ICD-10-CM | POA: Diagnosis present

## 2016-11-26 DIAGNOSIS — R791 Abnormal coagulation profile: Secondary | ICD-10-CM | POA: Diagnosis present

## 2016-11-26 DIAGNOSIS — Z23 Encounter for immunization: Secondary | ICD-10-CM

## 2016-11-26 DIAGNOSIS — Y92009 Unspecified place in unspecified non-institutional (private) residence as the place of occurrence of the external cause: Secondary | ICD-10-CM

## 2016-11-26 DIAGNOSIS — Z96643 Presence of artificial hip joint, bilateral: Secondary | ICD-10-CM | POA: Diagnosis present

## 2016-11-26 DIAGNOSIS — E876 Hypokalemia: Secondary | ICD-10-CM | POA: Diagnosis not present

## 2016-11-26 DIAGNOSIS — Z7189 Other specified counseling: Secondary | ICD-10-CM

## 2016-11-26 DIAGNOSIS — R531 Weakness: Secondary | ICD-10-CM | POA: Diagnosis not present

## 2016-11-26 DIAGNOSIS — R52 Pain, unspecified: Secondary | ICD-10-CM

## 2016-11-26 DIAGNOSIS — Z885 Allergy status to narcotic agent status: Secondary | ICD-10-CM

## 2016-11-26 DIAGNOSIS — J441 Chronic obstructive pulmonary disease with (acute) exacerbation: Secondary | ICD-10-CM | POA: Diagnosis present

## 2016-11-26 DIAGNOSIS — Z66 Do not resuscitate: Secondary | ICD-10-CM | POA: Diagnosis present

## 2016-11-26 DIAGNOSIS — K219 Gastro-esophageal reflux disease without esophagitis: Secondary | ICD-10-CM | POA: Diagnosis present

## 2016-11-26 DIAGNOSIS — T45515A Adverse effect of anticoagulants, initial encounter: Secondary | ICD-10-CM | POA: Diagnosis present

## 2016-11-26 DIAGNOSIS — D649 Anemia, unspecified: Secondary | ICD-10-CM | POA: Diagnosis present

## 2016-11-26 DIAGNOSIS — I130 Hypertensive heart and chronic kidney disease with heart failure and stage 1 through stage 4 chronic kidney disease, or unspecified chronic kidney disease: Secondary | ICD-10-CM | POA: Diagnosis present

## 2016-11-26 DIAGNOSIS — R0602 Shortness of breath: Secondary | ICD-10-CM | POA: Diagnosis not present

## 2016-11-26 DIAGNOSIS — N179 Acute kidney failure, unspecified: Principal | ICD-10-CM | POA: Diagnosis present

## 2016-11-26 DIAGNOSIS — Z515 Encounter for palliative care: Secondary | ICD-10-CM

## 2016-11-26 DIAGNOSIS — I10 Essential (primary) hypertension: Secondary | ICD-10-CM | POA: Diagnosis not present

## 2016-11-26 DIAGNOSIS — Z79899 Other long term (current) drug therapy: Secondary | ICD-10-CM

## 2016-11-26 DIAGNOSIS — E039 Hypothyroidism, unspecified: Secondary | ICD-10-CM | POA: Diagnosis present

## 2016-11-26 DIAGNOSIS — W19XXXA Unspecified fall, initial encounter: Secondary | ICD-10-CM | POA: Diagnosis not present

## 2016-11-26 DIAGNOSIS — I480 Paroxysmal atrial fibrillation: Secondary | ICD-10-CM | POA: Diagnosis present

## 2016-11-26 DIAGNOSIS — Z7901 Long term (current) use of anticoagulants: Secondary | ICD-10-CM

## 2016-11-26 DIAGNOSIS — R1030 Lower abdominal pain, unspecified: Secondary | ICD-10-CM | POA: Diagnosis present

## 2016-11-26 DIAGNOSIS — J9601 Acute respiratory failure with hypoxia: Secondary | ICD-10-CM | POA: Diagnosis not present

## 2016-11-26 DIAGNOSIS — E861 Hypovolemia: Secondary | ICD-10-CM | POA: Diagnosis present

## 2016-11-26 DIAGNOSIS — S50312A Abrasion of left elbow, initial encounter: Secondary | ICD-10-CM | POA: Diagnosis present

## 2016-11-26 DIAGNOSIS — R059 Cough, unspecified: Secondary | ICD-10-CM

## 2016-11-26 DIAGNOSIS — R05 Cough: Secondary | ICD-10-CM

## 2016-11-26 LAB — URINALYSIS, ROUTINE W REFLEX MICROSCOPIC
BILIRUBIN URINE: NEGATIVE
Glucose, UA: NEGATIVE mg/dL
Hgb urine dipstick: NEGATIVE
KETONES UR: NEGATIVE mg/dL
Leukocytes, UA: NEGATIVE
Nitrite: NEGATIVE
PH: 5 (ref 5.0–8.0)
Protein, ur: 100 mg/dL — AB
Specific Gravity, Urine: 1.023 (ref 1.005–1.030)

## 2016-11-26 LAB — I-STAT CHEM 8, ED
BUN: 41 mg/dL — AB (ref 6–20)
CHLORIDE: 101 mmol/L (ref 101–111)
CREATININE: 2.1 mg/dL — AB (ref 0.61–1.24)
Calcium, Ion: 0.78 mmol/L — CL (ref 1.15–1.40)
GLUCOSE: 104 mg/dL — AB (ref 65–99)
HCT: 32 % — ABNORMAL LOW (ref 39.0–52.0)
HEMOGLOBIN: 10.9 g/dL — AB (ref 13.0–17.0)
Potassium: 3 mmol/L — ABNORMAL LOW (ref 3.5–5.1)
Sodium: 144 mmol/L (ref 135–145)
TCO2: 31 mmol/L (ref 22–32)

## 2016-11-26 LAB — CBC WITH DIFFERENTIAL/PLATELET
Basophils Absolute: 0 10*3/uL (ref 0.0–0.1)
Basophils Relative: 0 %
EOS PCT: 1 %
Eosinophils Absolute: 0.1 10*3/uL (ref 0.0–0.7)
HEMATOCRIT: 37.3 % — AB (ref 39.0–52.0)
Hemoglobin: 12.3 g/dL — ABNORMAL LOW (ref 13.0–17.0)
LYMPHS ABS: 0.8 10*3/uL (ref 0.7–4.0)
LYMPHS PCT: 8 %
MCH: 32.9 pg (ref 26.0–34.0)
MCHC: 33 g/dL (ref 30.0–36.0)
MCV: 99.7 fL (ref 78.0–100.0)
MONO ABS: 1 10*3/uL (ref 0.1–1.0)
MONOS PCT: 10 %
Neutro Abs: 8.1 10*3/uL — ABNORMAL HIGH (ref 1.7–7.7)
Neutrophils Relative %: 81 %
PLATELETS: 223 10*3/uL (ref 150–400)
RBC: 3.74 MIL/uL — ABNORMAL LOW (ref 4.22–5.81)
RDW: 15.3 % (ref 11.5–15.5)
WBC: 10.1 10*3/uL (ref 4.0–10.5)

## 2016-11-26 LAB — PHOSPHORUS: PHOSPHORUS: 2.7 mg/dL (ref 2.5–4.6)

## 2016-11-26 LAB — CREATININE, URINE, RANDOM: CREATININE, URINE: 175.73 mg/dL

## 2016-11-26 LAB — PROTIME-INR
INR: 3.9
Prothrombin Time: 37.9 seconds — ABNORMAL HIGH (ref 11.4–15.2)

## 2016-11-26 LAB — MAGNESIUM: Magnesium: 1.2 mg/dL — ABNORMAL LOW (ref 1.7–2.4)

## 2016-11-26 LAB — BRAIN NATRIURETIC PEPTIDE: B Natriuretic Peptide: 37.1 pg/mL (ref 0.0–100.0)

## 2016-11-26 LAB — CALCIUM: Calcium: 6.7 mg/dL — ABNORMAL LOW (ref 8.9–10.3)

## 2016-11-26 MED ORDER — MAGNESIUM OXIDE 400 (241.3 MG) MG PO TABS
400.0000 mg | ORAL_TABLET | Freq: Once | ORAL | Status: AC
  Administered 2016-11-27: 400 mg via ORAL
  Filled 2016-11-26 (×3): qty 1

## 2016-11-26 MED ORDER — LATANOPROST 0.005 % OP SOLN
1.0000 [drp] | Freq: Every day | OPHTHALMIC | Status: DC
  Administered 2016-11-27 – 2016-11-29 (×4): 1 [drp] via OPHTHALMIC
  Filled 2016-11-26: qty 2.5

## 2016-11-26 MED ORDER — IPRATROPIUM-ALBUTEROL 0.5-2.5 (3) MG/3ML IN SOLN
3.0000 mL | Freq: Three times a day (TID) | RESPIRATORY_TRACT | Status: DC
  Administered 2016-11-27 (×2): 3 mL via RESPIRATORY_TRACT
  Filled 2016-11-26 (×3): qty 3

## 2016-11-26 MED ORDER — SODIUM CHLORIDE 0.9 % IV SOLN
1.0000 g | Freq: Once | INTRAVENOUS | Status: AC
  Administered 2016-11-27: 1 g via INTRAVENOUS
  Filled 2016-11-26: qty 10

## 2016-11-26 MED ORDER — TETANUS-DIPHTH-ACELL PERTUSSIS 5-2.5-18.5 LF-MCG/0.5 IM SUSP
0.5000 mL | Freq: Once | INTRAMUSCULAR | Status: AC
  Administered 2016-11-26: 0.5 mL via INTRAMUSCULAR
  Filled 2016-11-26: qty 0.5

## 2016-11-26 MED ORDER — SENNOSIDES-DOCUSATE SODIUM 8.6-50 MG PO TABS
1.0000 | ORAL_TABLET | Freq: Every day | ORAL | Status: DC
  Administered 2016-11-27 – 2016-11-30 (×4): 1 via ORAL
  Filled 2016-11-26 (×4): qty 1

## 2016-11-26 MED ORDER — HEPARIN SODIUM (PORCINE) 5000 UNIT/ML IJ SOLN: 5000.0000 [IU] | Freq: Three times a day (TID) | INTRAMUSCULAR | Status: DC

## 2016-11-26 MED ORDER — ACETAMINOPHEN 650 MG RE SUPP: 650.0000 mg | Freq: Four times a day (QID) | RECTAL | Status: DC | PRN

## 2016-11-26 MED ORDER — CALCIUM CARBONATE ANTACID 500 MG PO CHEW
1.0000 | CHEWABLE_TABLET | Freq: Once | ORAL | Status: AC
  Administered 2016-11-27: 200 mg via ORAL
  Filled 2016-11-26 (×3): qty 1

## 2016-11-26 MED ORDER — ONDANSETRON HCL 4 MG/2ML IJ SOLN: 4.0000 mg | Freq: Four times a day (QID) | INTRAMUSCULAR | Status: DC | PRN

## 2016-11-26 MED ORDER — LEVOTHYROXINE SODIUM 50 MCG PO TABS
50.0000 ug | ORAL_TABLET | Freq: Every day | ORAL | Status: DC
  Administered 2016-11-27 – 2016-11-30 (×4): 50 ug via ORAL
  Filled 2016-11-26 (×4): qty 1

## 2016-11-26 MED ORDER — ONDANSETRON HCL 4 MG PO TABS: 4.0000 mg | ORAL_TABLET | Freq: Four times a day (QID) | ORAL | Status: DC | PRN

## 2016-11-26 MED ORDER — AMIODARONE HCL 200 MG PO TABS
200.0000 mg | ORAL_TABLET | Freq: Every day | ORAL | Status: DC
  Administered 2016-11-27 – 2016-11-30 (×4): 200 mg via ORAL
  Filled 2016-11-26 (×4): qty 1

## 2016-11-26 MED ORDER — ACETAMINOPHEN 325 MG PO TABS
650.0000 mg | ORAL_TABLET | Freq: Four times a day (QID) | ORAL | Status: DC | PRN
  Administered 2016-11-29: 650 mg via ORAL
  Filled 2016-11-26 (×2): qty 2

## 2016-11-26 MED ORDER — PANTOPRAZOLE SODIUM 40 MG PO TBEC
40.0000 mg | DELAYED_RELEASE_TABLET | Freq: Every day | ORAL | Status: DC
  Administered 2016-11-27 – 2016-11-30 (×4): 40 mg via ORAL
  Filled 2016-11-26 (×4): qty 1

## 2016-11-26 MED ORDER — POTASSIUM CHLORIDE CRYS ER 20 MEQ PO TBCR
40.0000 meq | EXTENDED_RELEASE_TABLET | Freq: Once | ORAL | Status: AC
  Administered 2016-11-26: 40 meq via ORAL
  Filled 2016-11-26: qty 2

## 2016-11-26 MED ORDER — MAGNESIUM SULFATE 2 GM/50ML IV SOLN
2.0000 g | Freq: Once | INTRAVENOUS | Status: AC
  Administered 2016-11-27: 2 g via INTRAVENOUS
  Filled 2016-11-26: qty 50

## 2016-11-26 NOTE — ED Notes (Signed)
Attempted report x1. 

## 2016-11-26 NOTE — ED Provider Notes (Signed)
[facility]-EMERGENCY DEPT Provider Note   CSN: [id number] Arrival date & time: 11/26/16  1650     History   Chief Complaint Chief Complaint  Patient presents with  . Weakness  . Leg Swelling    HPI Jordan [name] is a 83 y.o. male.  HPI patient complain of generalized weakness onset several days ago. He fell at home the day after being released from [facility] where he spent time at rehabilitation after admission here for a story stress felt secondary to COPD exacerbation and chronic congestive heart failure. He denies dyspnea.. Patient also fell at home again today due to weakness and left legs. As a result fall he suffered an abrasion to his left elbow. No other injury. He denies increased shortness of breath denies other associated symptoms.  Past Medical History:  Diagnosis Date  . Acute blood loss anemia   . CHF (congestive heart failure) (HCC)   . Chronic diastolic heart failure (HCC)   . CKD (chronic kidney disease) stage 3, GFR 30-59 ml/min   . Constipation   . COPD (chronic obstructive pulmonary disease) (HCC)   . Dyspnea   . Ectropion   . Family history of adverse reaction to anesthesia    " daughter has difficulty waking "  . GERD (gastroesophageal reflux disease)   . Hypertension   . Intertrochanteric fracture of left hip (HCC)   . Leukocytosis   . Paroxysmal atrial fibrillation (HCC)   . Physical deconditioning   . PVC (premature ventricular contraction)   . Thyroid disease   . Ventricular bigeminy     Patient Active Problem List   Diagnosis Date Noted  . CKD (chronic kidney disease) stage 4, GFR 15-29 ml/min (HCC) 10/24/2016  . Respiratory distress 10/23/2016  . BRBPR (bright red blood per rectum) 10/23/2016  . Paroxysmal atrial fibrillation (HCC)   . Closed compression fracture of L2 lumbar vertebra (HCC)/ ACUTE/SUBACUTE 07/18/2016  . Abdominal distention   . Low back pain   . AKI (acute kidney injury) (HCC) 07/16/2016  . Fecal impaction (HCC)  07/16/2016  . GERD (gastroesophageal reflux disease) 07/16/2016  . Femur fracture, left (HCC) 05/07/2015  . Hip fracture (HCC) 05/06/2015  . Faintness 09/16/2013  . 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 Syncope 07/10/2013  . Pneumonia 05/23/2013  . Dysphagia 05/09/2013  . Acute blood loss anemia 05/08/2013  . Food impaction of esophagus 04/26/2013  . Closed right hip fracture (HCC) 04/24/2013  . CAP (community acquired pneumonia) 04/24/2013  . Leukocytosis 04/24/2013  . Encounter for therapeutic drug monitoring 04/18/2013  . COPD exacerbation (HCC) 01/01/2013  . Chronic anticoagulation 01/01/2013  . Chronic diastolic heart failure (HCC) 01/01/2013  . Diarrhea 01/01/2013  . Atrial fibrillation with RVR (HCC) 12/18/2012  . Bradycardia 12/16/2012  . Hypertension 12/16/2012  . Hypothyroidism 12/16/2012  . Acute kidney failure (HCC) 12/16/2012    Past Surgical History:  Procedure Laterality Date  . CHOLECYSTECTOMY    . ESOPHAGOGASTRODUODENOSCOPY N/A 04/26/2013   Procedure: ESOPHAGOGASTRODUODENOSCOPY (EGD);  Surgeon: Florencia Reasons, MD;  Location: [facility];  Service: Endoscopy;  Laterality: N/A;  . ESOPHAGOGASTRODUODENOSCOPY (EGD) WITH ESOPHAGEAL DILATION N/A 04/26/2013   Procedure: ESOPHAGOGASTRODUODENOSCOPY (EGD) WITH ESOPHAGEAL DILATION;  Surgeon: Florencia Reasons, MD;  Location: [facility] ENDOSCOPY;  Service: Endoscopy;  Laterality: N/A;  . HIP ARTHROPLASTY Right 04/25/2013   Procedure: ARTHROPLASTY BIPOLAR HIP;  Surgeon: Nestor Lewandowsky, MD;  Location: [facility] OR;  Service: Orthopedics;  Laterality: Right;  . HIP ARTHROPLASTY Left 05/07/2015   Procedure: LEFT HEMI-HIP  ARTHROPLASTY CEMENTED;  Surgeon: Gean Birchwood, MD;  Location: [facility] OR;  Service: Orthopedics;  Laterality: Left;  . SPLENECTOMY, PARTIAL         Home Medications    Prior to Admission medications   Medication Sig Start Date End Date Taking? Authorizing Provider  acetaminophen (TYLENOL) 650 MG CR tablet Take 650 mg by mouth 2 (two)  times daily. arthritis   Yes [provider]  albuterol (PROVENTIL HFA;VENTOLIN HFA) 108 (90 Base) MCG/ACT inhaler Inhale 2 puffs into the lungs every 6 (six) hours as needed for wheezing or shortness of breath.   Yes [provider]  amiodarone (PACERONE) 200 MG tablet Take 1 tablet (200 mg total) by mouth daily. 11/09/16  Yes Jake Bathe, MD  furosemide (LASIX) 40 MG tablet Take 40 mg by mouth See admin instructions. Take on Monday, Wednesday and Friday   Yes [provider]  ipratropium-albuterol (DUONEB) 0.5-2.5 (3) MG/3ML SOLN Take 3 mLs by nebulization every 8 (eight) hours. 10/27/16  Yes Johnson, Clanford L, MD  latanoprost (XALATAN) 0.005 % ophthalmic solution Place 1 drop into both eyes at bedtime. For glaucoma   Yes [provider]  levothyroxine (SYNTHROID, LEVOTHROID) 50 MCG tablet Take 50 mcg by mouth daily before breakfast.   Yes [provider]  pantoprazole (PROTONIX) 40 MG tablet Take 1 tablet (40 mg total) by mouth daily. 05/11/15  Yes Ghimire, Werner Lean, MD  senna-docusate (SENOKOT-S) 8.6-50 MG tablet Take 1 tablet by mouth daily. 10/27/16  Yes Johnson, Clanford L, MD  warfarin (COUMADIN) 2 MG tablet Take 1 tablet (2 mg total) by mouth daily at 6 PM. Patient taking differently: Take 2 mg by mouth See admin instructions. Takes 2 mg on Sunday, Takes 4 mg on Friday and Saturday. She does not remember the what he takes on the other days she as it written down at home. The nurse will come back Thursday 11/30/2016 10/29/16  Yes Cleora Fleet, MD    Family History Family History  Problem Relation Age of Onset  . Hypertension Father     Social History Social History  Substance Use Topics  . Smoking status: Former Games developer  . Smokeless tobacco: Never Used     Comment: quit smoking in 1990  . Alcohol use No     Allergies   Diltiazem hcl er beads; Metoprolol succinate er [metoprolol tartrate]; Tramadol hcl; and Codeine   Review  of Systems Review of Systems  Constitutional: Negative.   HENT: Negative.   Respiratory: Negative.        Chronic dyspnea  Cardiovascular: Positive for leg swelling.  Gastrointestinal: Negative.        Denies black stools or blood in bowel movements  Musculoskeletal: Negative.   Skin: Positive for wound.       Abrasions left elbow  Neurological: Negative.   Hematological: Bruises/bleeds easily.  Psychiatric/Behavioral: Negative.   All other systems reviewed and are negative.    Physical Exam Updated Vital Signs BP (!) 145/70 (BP Location: Left Arm)   Pulse 83   Temp 98.9 F (37.2 C) (Oral)   Resp (!) 24   Ht  (1.727 m)   Wt 83 kg (183 lb)   SpO2 100%   BMI 27.83 kg/m    Physical Exam  Constitutional: He is oriented to person, place, and time.  Chronically ill-appearing  HENT:  Head: Normocephalic and atraumatic.  Eyes: Pupils are equal, round, and reactive to light. Conjunctivae are normal.  Neck: Neck supple.  No tracheal deviation present. No thyromegaly present.  Cardiovascular: Normal rate and regular rhythm.   No murmur heard. Pulmonary/Chest: Effort normal and breath sounds normal.  Abdominal: Soft. Bowel sounds are normal. He exhibits no distension. There is no tenderness.  Musculoskeletal: Normal range of motion. He exhibits edema. He exhibits no tenderness.  2+ pretibial pitting edema bilaterally  Neurological: He is alert and oriented to person, place, and time. Coordination normal.  Skin: Skin is warm and dry. No rash noted.  Dime-sized abrasion at left posterior elbow otherwise without wound or rash  Psychiatric: He has a normal mood and affect.  Nursing note and vitals reviewed.    ED Treatments / Results  Labs (all labs ordered are listed, but only abnormal results are displayed) Labs Reviewed  CBC WITH DIFFERENTIAL/PLATELET  PROTIME-INR  I-STAT CHEM 8, ED   ED ECG REPORT   Date: 11/26/2016  Rate: 85  Rhythm: normal sinus rhythm  QRS  Axis: normal  Intervals: normal  ST/T Wave abnormalities: normal  Conduction Disutrbances:none  Narrative Interpretation:   Old EKG Reviewed: Normal sinus rhythm has replaced atrial fibrillation  I have personally reviewed the EKG tracing and agree with the computerized printout as noted. EKG  EKG Interpretation None      Results for orders placed or performed during the hospital encounter of 11/26/16  CBC with Differential/Platelet  Result Value Ref Range   WBC 10.1 4.0 - 10.5 K/uL   RBC 3.74 (L) 4.22 - 5.81 MIL/uL   Hemoglobin 12.3 (L) 13.0 - 17.0 g/dL   HCT 09.8 (L) 11.9 - 14.7 %   MCV 99.7 78.0 - 100.0 fL   MCH 32.9 26.0 - 34.0 pg   MCHC 33.0 30.0 - 36.0 g/dL   RDW 82.9 56.2 - 13.0 %   Platelets 223 150 - 400 K/uL   Neutrophils Relative % 81 %   Neutro Abs 8.1 (H) 1.7 - 7.7 K/uL   Lymphocytes Relative 8 %   Lymphs Abs 0.8 0.7 - 4.0 K/uL   Monocytes Relative 10 %   Monocytes Absolute 1.0 0.1 - 1.0 K/uL   Eosinophils Relative 1 %   Eosinophils Absolute 0.1 0.0 - 0.7 K/uL   Basophils Relative 0 %   Basophils Absolute 0.0 0.0 - 0.1 K/uL  Protime-INR  Result Value Ref Range   Prothrombin Time 37.9 (H) 11.4 - 15.2 seconds   INR 3.90   Calcium  Result Value Ref Range   Calcium 6.7 (L) 8.9 - 10.3 mg/dL  Magnesium  Result Value Ref Range   Magnesium 1.2 (L) 1.7 - 2.4 mg/dL  I-stat chem 8, ed  Result Value Ref Range   Sodium 144 135 - 145 mmol/L   Potassium 3.0 (L) 3.5 - 5.1 mmol/L   Chloride 101 101 - 111 mmol/L   BUN 41 (H) 6 - 20 mg/dL   Creatinine, Ser 8.65 (H) 0.61 - 1.24 mg/dL   Glucose, Bld 784 (H) 65 - 99 mg/dL   Calcium, Ion 6.96 (LL) 1.15 - 1.40 mmol/L   TCO2 31 22 - 32 mmol/L   Hemoglobin 10.9 (L) 13.0 - 17.0 g/dL   HCT 29.5 (L) 28.4 - 13.2 %   Comment NOTIFIED PHYSICIAN    No results found.  Radiology No results found.  Procedures Procedures (including critical care time)  Medications Ordered in ED Medications  Tdap (BOOSTRIX) injection 0.5  mL (not administered)     Initial Impression / Assessment and Plan / ED Course  I have reviewed  the triage vital signs and the nursing notes.  Pertinent labs & imaging results that were available during my care of the patient were reviewed by me and considered in my medical decision making (see chart for details).     Oral potassium, calcium and magnesium supplementation ordered by me. I've consulted Dr. Katrinka Blazing from hospital service who will arrange for overnight stay. I feel the patient is deconditioned from prior hospitalization leading to generalized weakness and fall. He is mildly over anticoagulated.  Final Clinical Impressions(s) / ED Diagnoses  Diagnosis #1 generalized weakness #2 multiple falls #3 hypokalemia #4 hypomagnesemia #5 hypocalcemia #6 mild warfarin toxicity Final diagnoses:  None   #7 anemia New Prescriptions New Prescriptions   No medications on file    Doug Sou, MD 11/26/16 2027

## 2016-11-26 NOTE — ED Notes (Signed)
Patient's family updated on delays and rooming.  Admitting to see patient next.

## 2016-11-26 NOTE — Progress Notes (Signed)
ANTICOAGULATION CONSULT NOTE - Initial Consult  Pharmacy Consult for Warfarin Indication: atrial fibrillation  Allergies  Allergen Reactions  . Diltiazem Hcl Er Beads Other (See Comments)    Tachycardia  . Metoprolol Succinate Er [Metoprolol Tartrate] Other (See Comments)    hallucinations  . Tramadol Hcl     Weak he could not stand-up  . Codeine     hallucination    Patient Measurements: Height:  (172.7 cm) Weight: 183 lb (83 kg) IBW/kg (Calculated) : 68.4  Vital Signs: Temp: 98.9 F (37.2 C) (09/16 1656) Temp Source: Oral (09/16 1656) BP: 114/59 (09/16 1830) Pulse Rate: 80 (09/16 1830)  Labs:  Recent Labs  11/26/16 1852 11/26/16 1909  HGB 12.3* 10.9*  HCT 37.3* 32.0*  PLT 223  --   LABPROT 37.9*  --   INR 3.90  --   CREATININE  --  2.10*    Estimated Creatinine Clearance: 21.6 mL/min (A) (by C-G formula based on SCr of 2.1 mg/dL (H)).   Medical History: Past Medical History:  Diagnosis Date  . Acute blood loss anemia   . CHF (congestive heart failure) (HCC)   . Chronic diastolic heart failure (HCC)   . CKD (chronic kidney disease) stage 3, GFR 30-59 ml/min   . Constipation   . COPD (chronic obstructive pulmonary disease) (HCC)   . Dyspnea   . Ectropion   . Family history of adverse reaction to anesthesia    " daughter has difficulty waking "  . GERD (gastroesophageal reflux disease)   . Hypertension   . Intertrochanteric fracture of left hip (HCC)   . Leukocytosis   . Paroxysmal atrial fibrillation (HCC)   . Physical deconditioning   . PVC (premature ventricular contraction)   . Thyroid disease   . Ventricular bigeminy     Assessment: 83 year old male with Afib on warfarin prior to admission INR elevated on admission Dose prior to admission = 2.5 mg TThSatSun, 3.75 mg MWF  Goal of Therapy:  INR 2-3 Monitor platelets by anticoagulation protocol: Yes   Plan:  No warfarin tonight Daily INR  Thank you Okey Regal,  PharmD [phone]  11/26/2016,9:27 PM

## 2016-11-26 NOTE — ED Triage Notes (Signed)
Per EMS:  Pt released from [facility] nursing on Tuesday after a 3.5 week stay for CHF.  Pt has been deteriorating since he's been home, with increased weakness, SOB with exertion, and lower leg pitting edema.  Pt wears 3L at home since leaving [facility], 94% en route.

## 2016-11-26 NOTE — H&P (Signed)
History and Physical    Jordan [name] NWG:[id number] DOB: November 30, 1918 DOA: 11/26/2016  Referring MD/NP/PA:Dr. Doug Sou PCP: Kaleen Mask, MD  Patient coming from: Home via EMS  Chief Complaint: Fall   HPI: Jordan [name] is a 83 y.o. male with medical history significant of A. fib, HTN, hypothyroidism, COPD, chronic diastolic CHF; who presents after a fall.  Patient was admitted into the hospital from 813-8/17, and then sent to [facility] rehabilitation facility where he stayed 3.5 weeks and was discharged home 5 days ago. Family notes that just before he left the rehabilitation facility was noted have drops in his O2 saturations for which he was started on oxygen. Patient never required oxygen previously. There are also issues with his INR fluctuating up to 8 and adjustments had to be made to his Coumadin dosing. Since being home he has progressively declined. Previously, patient could ambulate with use of a rolling walker, but today was not able to even stand on his feet. He fell on Wednesday in the bathroom, and then again today. He hit his left elbow, and it started bleeding significantly. Associated symptoms include weight of leg swelling, and lower abdominal pain. Denies any loss of consciousness. Family notes that the patient's appetite has been otherwise good.    ED Course: Upon admission into the emergency department patient was seen to be afebrile, pulse 78-83, respirations 21-25, and all other vital signs maintained. Labs revealed WBC 10.1, hemoglobin 12.3, platelets 223, potassium 3 creatinine 2.1, calcium 6.7, magnesium 1.2. Patient was given 200 mg of calcium, 400 mg of magnesium oxide, and 40 mEq of potassium chloride while in the ED.    Review of Systems  Constitutional: Positive for malaise/fatigue. Negative for chills and fever.  HENT: Negative for ear discharge and nosebleeds.   Eyes: Negative for photophobia and pain.  Respiratory: Positive for shortness of breath.    Cardiovascular: Positive for leg swelling. Negative for chest pain.  Gastrointestinal: Positive for abdominal pain. Negative for nausea and vomiting.  Genitourinary: Negative for frequency and urgency.  Musculoskeletal: Positive for falls and myalgias.  Skin: Negative for itching and rash.  Neurological: Positive for weakness. Negative for speech change and focal weakness.  Psychiatric/Behavioral: Positive for memory loss. Negative for substance abuse.    Past Medical History:  Diagnosis Date  . Acute blood loss anemia   . CHF (congestive heart failure) (HCC)   . Chronic diastolic heart failure (HCC)   . CKD (chronic kidney disease) stage 3, GFR 30-59 ml/min   . Constipation   . COPD (chronic obstructive pulmonary disease) (HCC)   . Dyspnea   . Ectropion   . Family history of adverse reaction to anesthesia    " daughter has difficulty waking "  . GERD (gastroesophageal reflux disease)   . Hypertension   . Intertrochanteric fracture of left hip (HCC)   . Leukocytosis   . Paroxysmal atrial fibrillation (HCC)   . Physical deconditioning   . PVC (premature ventricular contraction)   . Thyroid disease   . Ventricular bigeminy     Past Surgical History:  Procedure Laterality Date  . CHOLECYSTECTOMY    . ESOPHAGOGASTRODUODENOSCOPY N/A 04/26/2013   Procedure: ESOPHAGOGASTRODUODENOSCOPY (EGD);  Surgeon: Florencia Reasons, MD;  Location[facility] ENDOSCOPY;  Service: Endoscopy;  Laterality: N/A;  . ESOPHAGOGASTRODUODENOSCOPY (EGD) WITH ESOPHAGEAL DILATION N/A 04/26/2013   Procedure: ESOPHAGOGASTRODUODENOSCOPY (EGD) WITH ESOPHAGEAL DILATION;  Surgeon: Florencia Reasons, MD;  Location: [facility] ENDOSCOPY;  Service: Endoscopy;  Laterality: N/A;  .  HIP ARTHROPLASTY Right 04/25/2013   Procedure: ARTHROPLASTY BIPOLAR HIP;  Surgeon: Nestor Lewandowsky, MD;  Location: [facility] OR;  Service: Orthopedics;  Laterality: Right;  . HIP ARTHROPLASTY Left 05/07/2015   Procedure: LEFT HEMI-HIP ARTHROPLASTY CEMENTED;  Surgeon:  Gean Birchwood, MD;  Location: [facility] OR;  Service: Orthopedics;  Laterality: Left;  . SPLENECTOMY, PARTIAL       reports that he has quit smoking. He has never used smokeless tobacco. He reports that he does not drink alcohol or use drugs.  Allergies  Allergen Reactions  . Diltiazem Hcl Er Beads Other (See Comments)    Tachycardia  . Metoprolol Succinate Er [Metoprolol Tartrate] Other (See Comments)    hallucinations  . Tramadol Hcl     Weak he could not stand-up  . Codeine     hallucination    Family History  Problem Relation Age of Onset  . Hypertension Father     Prior to Admission medications   Medication Sig Start Date End Date Taking? Authorizing Provider  acetaminophen (TYLENOL) 650 MG CR tablet Take 650 mg by mouth 2 (two) times daily. arthritis   Yes [provider]  albuterol (PROVENTIL HFA;VENTOLIN HFA) 108 (90 Base) MCG/ACT inhaler Inhale 2 puffs into the lungs every 6 (six) hours as needed for wheezing or shortness of breath.   Yes [provider]  amiodarone (PACERONE) 200 MG tablet Take 1 tablet (200 mg total) by mouth daily. 11/09/16  Yes Jake Bathe, MD  furosemide (LASIX) 40 MG tablet Take 40 mg by mouth See admin instructions. Take on Monday, Wednesday and Friday   Yes [provider]  ipratropium-albuterol (DUONEB) 0.5-2.5 (3) MG/3ML SOLN Take 3 mLs by nebulization every 8 (eight) hours. 10/27/16  Yes Johnson, Clanford L, MD  latanoprost (XALATAN) 0.005 % ophthalmic solution Place 1 drop into both eyes at bedtime. For glaucoma   Yes [provider]  levothyroxine (SYNTHROID, LEVOTHROID) 50 MCG tablet Take 50 mcg by mouth daily before breakfast.   Yes [provider]  pantoprazole (PROTONIX) 40 MG tablet Take 1 tablet (40 mg total) by mouth daily. 05/11/15  Yes Ghimire, Werner Lean, MD  senna-docusate (SENOKOT-S) 8.6-50 MG tablet Take 1 tablet by mouth daily. 10/27/16  Yes Johnson, Clanford L, MD  warfarin (COUMADIN) 2 MG tablet  Take 1 tablet (2 mg total) by mouth daily at 6 PM. Patient taking differently: Take 2.5 mg by mouth See admin instructions. Takes 2.5 mg on Sunday, Takes 5 mg on Friday and Saturday. She does not remember the what he takes on the other days she as it written down at home. The nurse will come back Thursday 11/30/2016 10/29/16  Yes Cleora Fleet, MD    Physical Exam:  Constitutional: elderly male who appears Ill, but in NAD Vitals:   11/26/16 1656 11/26/16 1700 11/26/16 1730 11/26/16 1830  BP: (!) 145/70 138/68 131/75 (!) 114/59  Pulse: 83 81 78 80  Resp: (!) 24 (!) 21 (!) 25 (!) 25  Temp: 98.9 F (37.2 C)     TempSrc: Oral     SpO2: 100% 100% 99% 100%  Weight:      Height:       Eyes: PERRL, lids and conjunctivae normal ENMT: Mucous membranes are dry. Posterior pharynx clear of any exudate or lesions.  Neck: normal, supple, no masses, no thyromegaly Respiratory: Mildly tachypneic, but clear on auscultation without any significant wheezes, crackles, rhonchi present Cardiovascular: Regular rate and rhythm, no murmurs / rubs / gallops.  1+ pitting lower extremity edema. 2+ pedal pulses. No carotid bruits.  Abdomen:  suprapubic tenderness present, no masses palpated. No hepatosplenomegaly. Bowel sounds positive.  Musculoskeletal: no clubbing / cyanosis. No joint deformity upper and lower extremities. Good ROM, no contractures. Normal muscle tone.  Skin: Contusion to left elbow with bruising and skin tear now wrapped. Neurologic: CN 2-12 grossly intact. Sensation intact, DTR normal. Strength 4/5 in all 4.  Psychiatric: Normal judgment and insight. Alert and oriented x 3. Normal mood.     Labs on Admission: I have personally reviewed following labs and imaging studies  CBC:  Recent Labs Lab 11/26/16 1852 11/26/16 1909  WBC 10.1  --   NEUTROABS 8.1*  --   HGB 12.3* 10.9*  HCT 37.3* 32.0*  MCV 99.7  --   PLT 223  --    Basic Metabolic Panel:  Recent Labs Lab  11/26/16 1852 11/26/16 1909  NA  --  144  K  --  3.0*  CL  --  101  GLUCOSE  --  104*  BUN  --  41*  CREATININE  --  2.10*  CALCIUM 6.7*  --   MG 1.2*  --    GFR: Estimated Creatinine Clearance: 21.6 mL/min (A) (by C-G formula based on SCr of 2.1 mg/dL (H)). Liver Function Tests: No results for input(s): AST, ALT, ALKPHOS, BILITOT, PROT, ALBUMIN in the last 168 hours. No results for input(s): LIPASE, AMYLASE in the last 168 hours. No results for input(s): AMMONIA in the last 168 hours. Coagulation Profile:  Recent Labs Lab 11/23/16 11/26/16 1852  INR 1.6 3.90   Cardiac Enzymes: No results for input(s): CKTOTAL, CKMB, CKMBINDEX, TROPONINI in the last 168 hours. BNP (last 3 results) No results for input(s): PROBNP in the last 8760 hours. HbA1C: No results for input(s): HGBA1C in the last 72 hours. CBG: No results for input(s): GLUCAP in the last 168 hours. Lipid Profile: No results for input(s): CHOL, HDL, LDLCALC, TRIG, CHOLHDL, LDLDIRECT in the last 72 hours. Thyroid Function Tests: No results for input(s): TSH, T4TOTAL, FREET4, T3FREE, THYROIDAB in the last 72 hours. Anemia Panel: No results for input(s): VITAMINB12, FOLATE, FERRITIN, TIBC, IRON, RETICCTPCT in the last 72 hours. Urine analysis:    Component Value Date/Time   COLORURINE YELLOW 10/23/2016 1344   APPEARANCEUR CLEAR 10/23/2016 1344   LABSPEC 1.027 10/23/2016 1344   PHURINE 5.0 10/23/2016 1344   GLUCOSEU NEGATIVE 10/23/2016 1344   HGBUR NEGATIVE 10/23/2016 1344   BILIRUBINUR NEGATIVE 10/23/2016 1344   KETONESUR NEGATIVE 10/23/2016 1344   PROTEINUR 30 (A) 10/23/2016 1344   UROBILINOGEN 0.2 07/10/2013 1520   NITRITE NEGATIVE 10/23/2016 1344   LEUKOCYTESUR NEGATIVE 10/23/2016 1344   Sepsis Labs: No results found for this or any previous visit (from the past 240 hour(s)).   Radiological Exams on Admission: No results found.  EKG: Independently reviewed. Sinus rhythm  Assessment/Plan Fall with  contusion to left elbow: Acute. Suspect due to generalized weakness and deconditioning. - Admit to a telemetry bed - PT to eval and treat  Acute kidney injury on chronic kidney disease stage III: Baseline creatinine previously noted to be 1.6-1.8. Patient presents with creatinine of 2.1 BUN 41 to suggest prerenal cause of symptoms. - Check urinalysis - Check FeUr - Bladder scan postvoid   Acute respiratory failure with hypoxia, COPD, without exacerbation: Patient with no previous history of requiring oxygen. Currently requiring 2-3 L to maintain O2 sats greater than 92%. Lungs otherwise sound clear. - Continuous pulse  oximetry withoxygen and keep O2 sats are 92% - Wean oxygen as tolerated to room air - Check chest x-ray - Check phosphorus level - Duonebs q8hr  and prn q4hr  - May warrant checking VQ scan for possibility of pulmonary embolus with recent report fluctuations and INR  Diastolic CHF: Suspect patient is hypovolemic. - Checking BNP  Electrolyte abnormalities: Hypokalemia, hypocalcemia, hypomagnesemia: Patient was given initial replacement while in the ED. - Recheck in a.m. and replace as needed  Paroxysmal atrial fibrillation :CHA2DS2-VASc Score 4 - Continue amiodarone   Supratherapeutic INR: acute. INR 3.9  - Hold Coumadin - Coumadin per pharmacy   Deconditioning: Acute. likely reason for fall as seen above. - Plan as seen above  Hypothyroidism - Continue levothyroxine  Genella Rife - Protonix  DVT prophylaxis: On Coumadin Code Status: Full  Family Communication: Discussed plan of care with family  and patient present at bedside Dispsition Plan: TBD Consults called: none Admission status: Observation  Clydie Braun MD [facility] Pager [phone]   If 7PM-7AM, please contact night-coverage [email or web address] Password [facility]  11/26/2016, 8:18 PM

## 2016-11-26 NOTE — ED Notes (Signed)
This RN attempted IV x 2.  Phlebotomy at bedside collecting blood.

## 2016-11-26 NOTE — ED Notes (Signed)
Writer called main lab, main lab stated they were able to add blood work.

## 2016-11-27 ENCOUNTER — Observation Stay (HOSPITAL_COMMUNITY): Payer: Medicare Other

## 2016-11-27 ENCOUNTER — Observation Stay (HOSPITAL_BASED_OUTPATIENT_CLINIC_OR_DEPARTMENT_OTHER): Payer: Medicare Other

## 2016-11-27 DIAGNOSIS — E039 Hypothyroidism, unspecified: Secondary | ICD-10-CM | POA: Diagnosis not present

## 2016-11-27 DIAGNOSIS — R791 Abnormal coagulation profile: Secondary | ICD-10-CM | POA: Diagnosis present

## 2016-11-27 DIAGNOSIS — N183 Chronic kidney disease, stage 3 (moderate): Secondary | ICD-10-CM | POA: Diagnosis not present

## 2016-11-27 DIAGNOSIS — R296 Repeated falls: Secondary | ICD-10-CM | POA: Diagnosis not present

## 2016-11-27 DIAGNOSIS — S5002XA Contusion of left elbow, initial encounter: Secondary | ICD-10-CM | POA: Diagnosis not present

## 2016-11-27 DIAGNOSIS — Z23 Encounter for immunization: Secondary | ICD-10-CM | POA: Diagnosis present

## 2016-11-27 DIAGNOSIS — D649 Anemia, unspecified: Secondary | ICD-10-CM | POA: Diagnosis not present

## 2016-11-27 DIAGNOSIS — J441 Chronic obstructive pulmonary disease with (acute) exacerbation: Secondary | ICD-10-CM | POA: Diagnosis not present

## 2016-11-27 DIAGNOSIS — J9601 Acute respiratory failure with hypoxia: Secondary | ICD-10-CM | POA: Diagnosis not present

## 2016-11-27 DIAGNOSIS — E876 Hypokalemia: Secondary | ICD-10-CM | POA: Diagnosis present

## 2016-11-27 DIAGNOSIS — Y92009 Unspecified place in unspecified non-institutional (private) residence as the place of occurrence of the external cause: Secondary | ICD-10-CM

## 2016-11-27 DIAGNOSIS — W19XXXA Unspecified fall, initial encounter: Secondary | ICD-10-CM | POA: Diagnosis not present

## 2016-11-27 DIAGNOSIS — Z66 Do not resuscitate: Secondary | ICD-10-CM | POA: Diagnosis not present

## 2016-11-27 DIAGNOSIS — T45515A Adverse effect of anticoagulants, initial encounter: Secondary | ICD-10-CM | POA: Diagnosis not present

## 2016-11-27 DIAGNOSIS — R0602 Shortness of breath: Secondary | ICD-10-CM | POA: Diagnosis not present

## 2016-11-27 DIAGNOSIS — I340 Nonrheumatic mitral (valve) insufficiency: Secondary | ICD-10-CM

## 2016-11-27 DIAGNOSIS — I361 Nonrheumatic tricuspid (valve) insufficiency: Secondary | ICD-10-CM

## 2016-11-27 DIAGNOSIS — N189 Chronic kidney disease, unspecified: Secondary | ICD-10-CM | POA: Diagnosis not present

## 2016-11-27 DIAGNOSIS — Z515 Encounter for palliative care: Secondary | ICD-10-CM | POA: Diagnosis not present

## 2016-11-27 DIAGNOSIS — R1030 Lower abdominal pain, unspecified: Secondary | ICD-10-CM | POA: Diagnosis not present

## 2016-11-27 DIAGNOSIS — Z96643 Presence of artificial hip joint, bilateral: Secondary | ICD-10-CM | POA: Diagnosis not present

## 2016-11-27 DIAGNOSIS — I5032 Chronic diastolic (congestive) heart failure: Secondary | ICD-10-CM | POA: Diagnosis not present

## 2016-11-27 DIAGNOSIS — N179 Acute kidney failure, unspecified: Secondary | ICD-10-CM | POA: Diagnosis not present

## 2016-11-27 DIAGNOSIS — I480 Paroxysmal atrial fibrillation: Secondary | ICD-10-CM | POA: Diagnosis not present

## 2016-11-27 DIAGNOSIS — R1310 Dysphagia, unspecified: Secondary | ICD-10-CM

## 2016-11-27 DIAGNOSIS — I130 Hypertensive heart and chronic kidney disease with heart failure and stage 1 through stage 4 chronic kidney disease, or unspecified chronic kidney disease: Secondary | ICD-10-CM | POA: Diagnosis not present

## 2016-11-27 DIAGNOSIS — K219 Gastro-esophageal reflux disease without esophagitis: Secondary | ICD-10-CM | POA: Diagnosis not present

## 2016-11-27 DIAGNOSIS — I10 Essential (primary) hypertension: Secondary | ICD-10-CM | POA: Diagnosis not present

## 2016-11-27 DIAGNOSIS — E861 Hypovolemia: Secondary | ICD-10-CM | POA: Diagnosis not present

## 2016-11-27 DIAGNOSIS — R531 Weakness: Secondary | ICD-10-CM | POA: Diagnosis not present

## 2016-11-27 LAB — COMPREHENSIVE METABOLIC PANEL
ALT: 21 U/L (ref 17–63)
AST: 21 U/L (ref 15–41)
Albumin: 2.7 g/dL — ABNORMAL LOW (ref 3.5–5.0)
Alkaline Phosphatase: 81 U/L (ref 38–126)
Anion gap: 9 (ref 5–15)
BILIRUBIN TOTAL: 1.3 mg/dL — AB (ref 0.3–1.2)
BUN: 35 mg/dL — ABNORMAL HIGH (ref 6–20)
CALCIUM: 7.3 mg/dL — AB (ref 8.9–10.3)
CO2: 33 mmol/L — ABNORMAL HIGH (ref 22–32)
CREATININE: 2.1 mg/dL — AB (ref 0.61–1.24)
Chloride: 106 mmol/L (ref 101–111)
GFR, EST AFRICAN AMERICAN: 29 mL/min — AB (ref >60)
GFR, EST NON AFRICAN AMERICAN: 25 mL/min — AB (ref >60)
Glucose, Bld: 92 mg/dL (ref 65–99)
Potassium: 3.9 mmol/L (ref 3.5–5.1)
Sodium: 148 mmol/L — ABNORMAL HIGH (ref 135–145)
TOTAL PROTEIN: 5.7 g/dL — AB (ref 6.5–8.1)

## 2016-11-27 LAB — CBC
HEMATOCRIT: 36.7 % — AB (ref 39.0–52.0)
Hemoglobin: 12.1 g/dL — ABNORMAL LOW (ref 13.0–17.0)
MCH: 33.1 pg (ref 26.0–34.0)
MCHC: 33 g/dL (ref 30.0–36.0)
MCV: 100.3 fL — AB (ref 78.0–100.0)
PLATELETS: 233 10*3/uL (ref 150–400)
RBC: 3.66 MIL/uL — ABNORMAL LOW (ref 4.22–5.81)
RDW: 15.2 % (ref 11.5–15.5)
WBC: 8.9 10*3/uL (ref 4.0–10.5)

## 2016-11-27 LAB — PREALBUMIN: PREALBUMIN: 14.7 mg/dL — AB (ref 18–38)

## 2016-11-27 LAB — ECHOCARDIOGRAM COMPLETE
Height: 68 in
Weight: 2927.71 oz

## 2016-11-27 LAB — PROTIME-INR
INR: 3.92
Prothrombin Time: 38.1 seconds — ABNORMAL HIGH (ref 11.4–15.2)

## 2016-11-27 LAB — MAGNESIUM: MAGNESIUM: 1.2 mg/dL — AB (ref 1.7–2.4)

## 2016-11-27 MED ORDER — METHYLPREDNISOLONE SODIUM SUCC 125 MG IJ SOLR
60.0000 mg | Freq: Four times a day (QID) | INTRAMUSCULAR | Status: DC
  Administered 2016-11-27 – 2016-11-28 (×4): 60 mg via INTRAVENOUS
  Filled 2016-11-27 (×3): qty 2

## 2016-11-27 MED ORDER — IPRATROPIUM-ALBUTEROL 0.5-2.5 (3) MG/3ML IN SOLN: 3.0000 mL | RESPIRATORY_TRACT | Status: DC | PRN

## 2016-11-27 MED ORDER — RESOURCE THICKENUP CLEAR PO POWD
ORAL | Status: DC | PRN
  Administered 2016-11-27: 11:00:00 via ORAL
  Filled 2016-11-27 (×2): qty 125

## 2016-11-27 MED ORDER — SODIUM CHLORIDE 0.9 % IV SOLN
Freq: Once | INTRAVENOUS | Status: AC
  Administered 2016-11-27: 13:00:00 via INTRAVENOUS

## 2016-11-27 MED ORDER — IPRATROPIUM-ALBUTEROL 0.5-2.5 (3) MG/3ML IN SOLN
3.0000 mL | Freq: Two times a day (BID) | RESPIRATORY_TRACT | Status: DC
  Administered 2016-11-28 – 2016-11-30 (×4): 3 mL via RESPIRATORY_TRACT
  Filled 2016-11-27 (×6): qty 3

## 2016-11-27 MED ORDER — MAGNESIUM SULFATE 2 GM/50ML IV SOLN
2.0000 g | Freq: Once | INTRAVENOUS | Status: AC
  Administered 2016-11-27: 2 g via INTRAVENOUS
  Filled 2016-11-27: qty 50

## 2016-11-27 MED ORDER — IOPAMIDOL (ISOVUE-300) INJECTION 61%
INTRAVENOUS | Status: AC
  Administered 2016-11-27: 30 mL
  Filled 2016-11-27: qty 30

## 2016-11-27 MED ORDER — WARFARIN - PHARMACIST DOSING INPATIENT: Freq: Every day | Status: DC

## 2016-11-27 MED ORDER — SODIUM CHLORIDE 0.9 % IV SOLN
Freq: Once | INTRAVENOUS | Status: AC
  Administered 2016-11-27: 02:00:00 via INTRAVENOUS

## 2016-11-27 NOTE — Care Management Obs Status (Deleted)
MEDICARE OBSERVATION STATUS NOTIFICATION   Patient Details  Name: Jordan [name] MRN: [id number] Date of Birth: [birth year]   Medicare Observation Status Notification Given:  Yes    Lem Peary, Annamarie Major, RN 11/27/2016, 10:54 AM

## 2016-11-27 NOTE — Care Management Obs Status (Signed)
MEDICARE OBSERVATION STATUS NOTIFICATION   Patient Details  Name: Jordan [name] MRN: [id number] Date of Birth: 1918/05/15   Medicare Observation Status Notification Given:  Yes    Karilynn Carranza, Annamarie Major, RN 11/27/2016, 11:03 AM

## 2016-11-27 NOTE — Evaluation (Signed)
Physical Therapy Evaluation Patient Details Name: Jordan [name] MRN: [id number] DOB: 1919-02-01 Today's Date: 11/27/2016   History of Present Illness  82 y.o. male admitted on 11/26/16 for fall with left elbow contusion, acute  kidney injury, acute respiratory failure with hypoxia, COPD.  Pt with significant PMH of ventricular bigeminy, PVC, PAF, left hip fx, HTN, COPD, CKD, chronic diasolic heart failure, CHF, acute blood loss anemia, R hip arthroplasty and left hip hemiarthroplasty.  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  Pt was recently admitted on 10/23/16 and was d/c to [facility] for rehab.  He has not been home long when he was admitted this time.    Clinical Impression  Pt is very deconditioned with significant pulmonary intolerance of activity.  He is very unsteady on his feet requiring mod assist and RW to take a few steps from the bed to the chair.  O2 sats decreased to 81% on 2 L O2 [place] and took 2-3 mins to recover with seated rest break back to >90%.  Pt would benefit from SNF level rehab again at discharge and family would like to go back to [facility] in [place].   PT to follow acutely for deficits listed below.       Follow Up Recommendations SNF ([facility] [place])    Equipment Recommendations  None recommended by PT    Recommendations for Other Services   NA    Precautions / Restrictions Precautions Precautions: Fall      Mobility  Bed Mobility Overal bed mobility: Needs Assistance Bed Mobility: Supine to Sit     Supine to sit: Min assist     General bed mobility comments: Min assist to support trunk to prevent posterior LOB during transition to EOB.   Transfers Overall transfer level: Needs assistance Equipment used: Rolling walker (2 wheeled) Transfers: Sit to/from Stand Sit to Stand: Min assist         General transfer comment: Heavy min assist with RW due to posterior lean and tipping of RW  once pt was standing.   Ambulation/Gait Ambulation/Gait assistance: Mod assist Ambulation Distance (Feet): 5 Feet Assistive device: Rolling walker (2 wheeled) Gait Pattern/deviations: Step-to pattern;Shuffle;Narrow base of support;Staggering right;Staggering left     General Gait Details: Pt with staggering, shuffling gait pattern with RW.  Assist needed at trunk for balance, and to help keep RW in contact with the ground.  Pt with significant increase in DOE with short distance gait.  Pt 3/4 DOE on 2 L O2 [place] and O2 sats dropped to 81% on 2 L with very little activity.  HR remained stable at 89 bpm.  O2 took 2-3 mins to recover once seated.           Balance Overall balance assessment: Needs assistance Sitting-balance support: Feet supported;Bilateral upper extremity supported Sitting balance-Leahy Scale: Poor Sitting balance - Comments: min assist EOB to prevent posterior LOB.  Postural control: Posterior lean Standing balance support: Bilateral upper extremity supported Standing balance-Leahy Scale: Poor Standing balance comment: Heavy min assist with RW in standing.                              Pertinent Vitals/Pain Pain Assessment: No/denies pain    Home Living Family/patient expects to be discharged to:: Private residence Living Arrangements: Children Available Help at Discharge: Family;Available 24 hours/day;Other (Comment) (almost 24/7) Type of Home:  House Home Access: Stairs to enter Entrance Stairs-Rails: Lawyer of Steps: 2 Home Layout: Two level;Able to live on main level with bedroom/bathroom Home Equipment: Dan Humphreys - 2 wheels;Toilet riser;Other (comment) (home O2 2-3 L new since SNF for rehab)      Prior Function Level of Independence: Needs assistance   Gait / Transfers Assistance Needed: min assist for gait household distances with RW, very SOB over the past few days.   ADL's / Homemaking Assistance Needed: total assist  for housework, cooking, cleaning.         Hand Dominance   Dominant Hand: Right    Extremity/Trunk Assessment   Upper Extremity Assessment Upper Extremity Assessment: Defer to OT evaluation    Lower Extremity Assessment Lower Extremity Assessment: RLE deficits/detail;LLE deficits/detail RLE Deficits / Details: right leg is generally weak at least 3/5 per functional assessment and seated exercises.  LLE Deficits / Details: left leg also generally weak and deconditioned, left ankle inverts with gait and per daughter he does better with his shoes (he does not have them here).  I inquired about if he was wearing his shoes when he fell and she said no, and thought maybe his ankle turned in and caused him to fall.     Cervical / Trunk Assessment Cervical / Trunk Assessment: Kyphotic  Communication   Communication: HOH  Cognition Arousal/Alertness: Awake/alert Behavior During Therapy: WFL for tasks assessed/performed Overall Cognitive Status: Impaired/Different from baseline Area of Impairment: Orientation;Attention;Memory;Following commands;Awareness;Safety/judgement;Problem solving                 Orientation Level: Disoriented to;Place;Time;Situation Current Attention Level: Sustained Memory: Decreased recall of precautions;Decreased short-term memory Following Commands: Follows one step commands consistently Safety/Judgement: Decreased awareness of safety;Decreased awareness of deficits Awareness: Intellectual Problem Solving: Difficulty sequencing;Requires verbal cues;Requires tactile cues General Comments: Per daughter, everytime he is admitted to the hospital he gets very confused.  At home he is much better.  Family manages his money and medications.          Exercises General Exercises - Lower Extremity Ankle Circles/Pumps: AROM;Both;20 reps;Seated Long Arc Quad: AROM;Both;15 reps;Seated Hip Flexion/Marching: AROM;Both;10 reps;Seated   Assessment/Plan    PT  Assessment Patient needs continued PT services  PT Problem List Decreased strength;Decreased activity tolerance;Decreased mobility;Decreased balance;Decreased coordination;Decreased cognition;Decreased knowledge of use of DME;Decreased safety awareness       PT Treatment Interventions DME instruction;Gait training;Stair training;Functional mobility training;Therapeutic activities;Therapeutic exercise;Balance training;Neuromuscular re-education;Cognitive remediation;Patient/family education    PT Goals (Current goals can be found in the Care Plan section)  Acute Rehab PT Goals Patient Stated Goal: daughter would like for him to go back to rehab PT Goal Formulation: With family Time For Goal Achievement: 12/11/16 Potential to Achieve Goals: Good    Frequency Min 2X/week (would benefit from 5 or more days per week)           AM-PAC PT "6 Clicks" Daily Activity  Outcome Measure Difficulty turning over in bed (including adjusting bedclothes, sheets and blankets)?: Unable Difficulty moving from lying on back to sitting on the side of the bed? : Unable Difficulty sitting down on and standing up from a chair with arms (e.g., wheelchair, bedside commode, etc,.)?: Unable Help needed moving to and from a bed to chair (including a wheelchair)?: A Little Help needed walking in hospital room?: A Little Help needed climbing 3-5 steps with a railing? : A Lot 6 Click Score: 11    End of Session Equipment Utilized During Treatment: Gait belt;Oxygen  Activity Tolerance: Patient limited by fatigue Patient left: in chair;with call bell/phone within reach;with chair alarm set;with family/visitor present Nurse Communication: Mobility status;Other (comment) (drop in O2 sats) PT Visit Diagnosis: Unsteadiness on feet (R26.81);Muscle weakness (generalized) (M62.81);Difficulty in walking, not elsewhere classified (R26.2)    Time: 1435-1520 PT Time Calculation (min) (ACUTE ONLY): 45 min   Charges:   PT  Evaluation $PT Eval Moderate Complexity: 1 Mod PT Treatments $Therapeutic Exercise: 8-22 mins $Therapeutic Activity: 8-22 mins   PT G Codes:             Devaney Segers B. Clancy Mullarkey, PT, DPT [phone]   PT G-Codes **NOT FOR INPATIENT CLASS** Functional Assessment Tool Used: AM-PAC 6 Clicks Basic Mobility Functional Limitation: Mobility: Walking and moving around Mobility: Walking and Moving Around Current Status ([id number]): At least 60 percent but less than 80 percent impaired, limited or restricted Mobility: Walking and Moving Around Goal Status [phone]): At least 20 percent but less than 40 percent impaired, limited or restricted   11/27/2016, 4:27 PM

## 2016-11-27 NOTE — Progress Notes (Signed)
ANTICOAGULATION CONSULT NOTE - Follow-Up  Pharmacy Consult for Warfarin Indication: atrial fibrillation  Patient Measurements: Height:  (172.7 cm) Weight: 182 lb 15.7 oz (83 kg) IBW/kg (Calculated) : 68.4  Vital Signs: Temp: 98.1 F (36.7 C) (09/17 0802) Temp Source: Oral (09/17 0802) BP: 139/59 (09/17 0802) Pulse Rate: 76 (09/17 0802)  Labs:  Recent Labs  11/26/16 1852 11/26/16 1909 11/27/16 0313  HGB 12.3* 10.9* 12.1*  HCT 37.3* 32.0* 36.7*  PLT 223  --  233  LABPROT 37.9*  --  38.1*  INR 3.90  --  3.92  CREATININE  --  2.10* 2.10*    Estimated Creatinine Clearance: 21.6 mL/min (A) (by C-G formula based on SCr of 2.1 mg/dL (H)).  Assessment: 69 YOM who presented on 9/17 s/p fall. The patient was on warfarin PTA for hx Afib. Admit INR elevated at 3.92 on PTA dosing of 2.5 mg TThSatSun, 3.75 mg MWF. Pharmacy consulted to resume dosing this admission.  INR today remains SUPRAtherapeutic (INR 3.92 << 3.9, goal of 2-3). CBC okay, no bleeding noted at this time.   Goal of Therapy:  INR 2-3 Monitor platelets by anticoagulation protocol: Yes   Plan:  1. Hold warfarin dose today 2. Will continue to monitor for any signs/symptoms of bleeding and will follow up with PT/INR in the a.m.   Thank you for allowing pharmacy to be a part of this patient's care.  Georgina Pillion, PharmD, BCPS Clinical Pharmacist Pager: [phone] Clinical phone for 11/27/2016 from 7a-3:30p: [phone] If after 3:30p, please call main pharmacy at: [phone] 11/27/2016 2:07 PM

## 2016-11-27 NOTE — Evaluation (Signed)
Clinical/Bedside Swallow Evaluation Patient Details  Name: Jordan [name] MRN: [id number] Date of Birth: 10-25-1918  Today's Date: 11/27/2016 Time: SLP Start Time (ACUTE ONLY): 0957 SLP Stop Time (ACUTE ONLY): 1011 SLP Time Calculation (min) (ACUTE ONLY): 14 min  Past Medical History:  Past Medical History:  Diagnosis Date  . Acute blood loss anemia   . CHF (congestive heart failure) (HCC)   . Chronic diastolic heart failure (HCC)   . CKD (chronic kidney disease) stage 3, GFR 30-59 ml/min   . Constipation   . COPD (chronic obstructive pulmonary disease) (HCC)   . Dyspnea   . Ectropion   . Family history of adverse reaction to anesthesia    " daughter has difficulty waking "  . GERD (gastroesophageal reflux disease)   . Hypertension   . Intertrochanteric fracture of left hip (HCC)   . Leukocytosis   . Paroxysmal atrial fibrillation (HCC)   . Physical deconditioning   . PVC (premature ventricular contraction)   . Thyroid disease   . Ventricular bigeminy    Past Surgical History:  Past Surgical History:  Procedure Laterality Date  . CHOLECYSTECTOMY    . ESOPHAGOGASTRODUODENOSCOPY N/A 04/26/2013   Procedure: ESOPHAGOGASTRODUODENOSCOPY (EGD);  Surgeon: Florencia Reasons, MD;  Location: [facility] ENDOSCOPY;  Service: Endoscopy;  Laterality: N/A;  . ESOPHAGOGASTRODUODENOSCOPY (EGD) WITH ESOPHAGEAL DILATION N/A 04/26/2013   Procedure: ESOPHAGOGASTRODUODENOSCOPY (EGD) WITH ESOPHAGEAL DILATION;  Surgeon: Florencia Reasons, MD;  Location: [facility] ENDOSCOPY;  Service: Endoscopy;  Laterality: N/A;  . HIP ARTHROPLASTY Right 04/25/2013   Procedure: ARTHROPLASTY BIPOLAR HIP;  Surgeon: Nestor Lewandowsky, MD;  Location: [facility] OR;  Service: Orthopedics;  Laterality: Right;  . HIP ARTHROPLASTY Left 05/07/2015   Procedure: LEFT HEMI-HIP ARTHROPLASTY CEMENTED;  Surgeon: Gean Birchwood, MD;  Location: [facility] OR;  Service: Orthopedics;  Laterality: Left;  . SPLENECTOMY, PARTIAL     HPI:  Pt is a 83 y.o.maleadmitted s/p fall,  found to have acute kidney injury and acute respiratory failure with need for supplemental O2 over the past two weeks per family. Pt had a recent hospital stay 8/13-8/17 followed by 3.5 weeks at SNF, where family says he had a swallow study done (report unavailable) that recommended thickened liquids. PMH includes a fib, HTN, hypthyroidism, COPD, chronic diastolic CHF, GERD, esophageal stricture s/p previous dilations, dyspnea   Assessment / Plan / Recommendation Clinical Impression  Pt has frequent coughing that follows even small sips of thin and nectar thick liquids, that is not observed after bites of solids. He and his family describe this as a more chronic problem, and share that he had previously been on thickened liquids (although they are not sure how thick). Recommend MBS to better assess oropharyngeal function, although note that pt is scheduled to have CT of the abdomen today. Per radiology, MBS will have to be done after the CT because the barium would impact visibility on the latter. MBS is tentatively scheduled to be completed this afternoon after his CT. Per RN, contrast is needed for the CT that is able to be thickened as needed - would thicken to a pudding consistency to facilitate safer intake.  SLP Visit Diagnosis: Dysphagia, unspecified (R13.10)    Aspiration Risk  Moderate aspiration risk    Diet Recommendation NPO except meds   Medication Administration: Crushed with puree (contrast thickened to pudding consistency) Compensations: Slow rate    Other  Recommendations Oral Care Recommendations: Oral care QID   Follow up Recommendations  Frequency and Duration            Prognosis Prognosis for Safe Diet Advancement: Good      Swallow Study   General HPI: Pt is a 83 y.o.maleadmitted s/p fall, found to have acute kidney injury and acute respiratory failure with need for supplemental O2 over the past two weeks per family. Pt had a recent hospital stay 8/13-8/17  followed by 3.5 weeks at SNF, where family says he had a swallow study done (report unavailable) that recommended thickened liquids. PMH includes a fib, HTN, hypthyroidism, COPD, chronic diastolic CHF, GERD, esophageal stricture s/p previous dilations, dyspnea Type of Study: Bedside Swallow Evaluation Previous Swallow Assessment: none in chart, see HPI Diet Prior to this Study: Regular;Thin liquids Temperature Spikes Noted: No Respiratory Status: Nasal cannula History of Recent Intubation: No Behavior/Cognition: Alert;Cooperative;Pleasant mood;Other (Comment) (HOH) Oral Cavity Assessment: Within Functional Limits Oral Care Completed by SLP: No Oral Cavity - Dentition: Dentures, top;Dentures, bottom Vision: Functional for self-feeding Self-Feeding Abilities: Able to feed self Patient Positioning: Upright in bed Baseline Vocal Quality: Normal Volitional Cough: Strong Volitional Swallow: Able to elicit    Oral/Motor/Sensory Function Overall Oral Motor/Sensory Function: Within functional limits   Ice Chips Ice chips: Not tested   Thin Liquid Thin Liquid: Impaired Presentation: Cup;Self Fed Pharyngeal  Phase Impairments: Cough - Immediate    Nectar Thick Nectar Thick Liquid: Impaired Presentation: Cup;Self Fed;Straw Pharyngeal Phase Impairments: Cough - Immediate   Honey Thick Honey Thick Liquid: Not tested   Puree Puree: Within functional limits Presentation: Self Fed;Spoon   Solid   GO   Solid: Within functional limits Presentation: Self Fed    Functional Assessment Tool Used: skilled clinical judgment Functional Limitations: Swallowing Swallow Current Status ([id number]): At least 60 percent but less than 80 percent impaired, limited or restricted Swallow Goal Status [id number]): At least 40 percent but less than 60 percent impaired, limited or restricted   Maxcine Ham 11/27/2016,10:44 AM   Maxcine Ham, M.A. CCC-SLP [phone]

## 2016-11-27 NOTE — Progress Notes (Signed)
Modified Barium Swallow Progress Note  Patient DetaiTHAYNE CINDRICalph B Klos MRN: [id number] Date of Birth: 09/06/1918  Today's Date: 11/27/2016  Modified Barium Swallow completed.  Full report located under Chart Review in the Imaging Section.  Brief recommendations include the following:  Clinical Impression  Pt has a mild oropharyngeal dysphagia with a suspected esophageal component as well, particularly given his h/o esophageal issues. He has decreased bolus cohesion orally with premature spillage with liquid consistencies tested. Liquids reach even to his pyriform sinuses before swallow initiation, which is not necessarily atypical given his advanced age, but I think when coupled with his decreased respriatory status, he does not achieve adequate airway closure in time to protect his airway. Thin liquids were silently aspirated before the swallow, and silent penetration was observed with straw sips of nectar thick liquids. He seemed to have somewhat improved airway protection when using a chin tuck with thin liquids by the cup, but his hearing loss impacted his ability to consistently follow commands for use of strategies. Also of note, pt was observed to have coughing during this study only after the barium tablet appeared to become lodged in the esophagus (MD not present to confirm), and was not actually associated with airway compromise. Recommend Dys 3 diet for energy conservation and to facilitate esophageal clearance. Would start with nectar thick liquids by cup only, but would try therapeutic trials of thin water with SLP only to work on implementation of a chin tuck.   Swallow Evaluation Recommendations       SLP Diet Recommendations: Dysphagia 3 (Mech soft) solids;Nectar thick liquid   Liquid Administration via: Cup;No straw   Medication Administration: Whole meds with puree (crush larger pills)   Supervision: Patient able to self feed;Full supervision/cueing for compensatory  strategies   Compensations: Minimize environmental distractions;Slow rate;Small sips/bites;Follow solids with liquid;Other (Comment) (take frequent rest breaks)   Postural Changes: Remain semi-upright after after feeds/meals (Comment);Seated upright at 90 degrees   Oral Care Recommendations: Oral care BID        Maxcine Ham 11/27/2016,2:41 PM  Maxcine Ham, M.A. CCC-SLP [phone]

## 2016-11-27 NOTE — Progress Notes (Signed)
  Echocardiogram 2D Echocardiogram has been performed.  Jordan [name] 11/27/2016, 12:08 PM

## 2016-11-27 NOTE — Progress Notes (Signed)
[facility]  Patient Status: Active (receiving services up to time of hospitalization)  [facility] is providing the following services: RN, PT, OT and HHA  If patient discharges after hours, please call [phone].   Jordan [name] 11/27/2016, 12:36 PM

## 2016-11-27 NOTE — Progress Notes (Signed)
PROGRESS NOTE    Jordan [name]  JXB:[id number] DOB: [birth year] DOA: 11/26/2016 PCP: Kaleen Mask, MD   Outpatient Specialists:     Brief Narrative:  Jordan [name] is a 83 y.o. male with medical history significant of A. fib, HTN, hypothyroidism, COPD, chronic diastolic CHF; who presents after a fall.  Patient was admitted into the hospital from 813-8/17, and then sent to [facility] rehabilitation facility where he stayed 3.5 weeks and was discharged home 5 days ago. Family notes that just before he left the rehabilitation facility was noted have drops in his O2 saturations for which he was started on oxygen. Patient never required oxygen previously. There are also issues with his INR fluctuating up to 8 and adjustments had to be made to his Coumadin dosing. Since being home he has progressively declined. Previously, patient could ambulate with use of a rolling walker, but today was not able to even stand on his feet. He fell on Wednesday in the bathroom, and then again today. He hit his left elbow, and it started bleeding significantly. Associated symptoms include weight of leg swelling, and lower abdominal pain. Denies any loss of consciousness. Family notes that the patient's appetite has been otherwise good.    Assessment & Plan:   Active Problems:   Hypertension   Chronic diastolic heart failure (HCC)   Acute kidney injury superimposed on chronic kidney disease (HCC)   Paroxysmal atrial fibrillation (HCC)   Fall at home, initial encounter   Acute respiratory failure with hypoxia (HCC)   Hypokalemia   Hypomagnesemia   Hypocalcemia   Fall with contusion to left elbow: Acute. Suspect due to generalized weakness and deconditioning. - PT Eval  Dysphagia -SLP eval- MBS  Lower abdominal pain -dg chest x ray  chronic kidney disease stage III: -Cr stable -monitor  Acute respiratory failure with hypoxia, COPD, with exacerbation:  -Currently requiring 2-3 L to maintain O2  sats greater than 92%. Lungs otherwise sound clear. - chest x-ray: emphysema - Duonebs q8hr  and prn q4hr   Chronic Diastolic CHF:  - BNP normal  Electrolyte abnormalities: Hypokalemia, hypocalcemia, hypomagnesemia: Patient was given initial replacement while in the ED. - Replaced  Paroxysmal atrial fibrillation :CHA2DS2-VASc Score 4 - Continue amiodarone -coumadin   Supratherapeutic INR: - Coumadin per pharmacy   Deconditioning:  -PT Eval  Hypothyroidism - Continue levothyroxine  Genella Rife - Protonix   DVT prophylaxis:  Fully anticoagulated  Code Status: Full Code   Family Communication:   Disposition Plan:     Consultants:      Subjective: Coughing with swallowing  Objective: Vitals:   11/26/16 2306 11/27/16 0500 11/27/16 0710 11/27/16 0802  BP: 131/62  (!) 145/59 (!) 139/59  Pulse: 73  89 76  Resp: (!) 22  (!) 24 (!) 28  Temp: 98.2 F (36.8 C)  97.9 F (36.6 C) 98.1 F (36.7 C)  TempSrc: Axillary  Oral Oral  SpO2: 97%  92% 97%  Weight: 83 kg (183 lb) 83 kg (182 lb 15.7 oz)    Height:        Intake/Output Summary (Last 24 hours) at 11/27/16 1407 Last data filed at 11/27/16 0600  Gross per 24 hour  Intake              120 ml  Output                0 ml  Net              120  ml   Filed Weights   11/26/16 1651 11/26/16 2306 11/27/16 0500  Weight: 83 kg (183 lb) 83 kg (183 lb) 83 kg (182 lb 15.7 oz)    Examination:  General exam: chronically ill appearing Respiratory system: poor effort, diminshed Cardiovascular system: irr Gastrointestinal system: +BS, soft Central nervous system: hard of hearing Extremities: no LE edema Skin: senile changes     Data Reviewed: I have personally reviewed following labs and imaging studies  CBC:  Recent Labs Lab 11/26/16 1852 11/26/16 1909 11/27/16 0313  WBC 10.1  --  8.9  NEUTROABS 8.1*  --   --   HGB 12.3* 10.9* 12.1*  HCT 37.3* 32.0* 36.7*  MCV 99.7  --  100.3*  PLT 223  --  233     Basic Metabolic Panel:  Recent Labs Lab 11/26/16 1852 11/26/16 1909 11/27/16 0313  NA  --  144 148*  K  --  3.0* 3.9  CL  --  101 106  CO2  --   --  33*  GLUCOSE  --  104* 92  BUN  --  41* 35*  CREATININE  --  2.10* 2.10*  CALCIUM 6.7*  --  7.3*  MG 1.2*  --  1.2*  PHOS 2.7  --   --    GFR: Estimated Creatinine Clearance: 21.6 mL/min (A) (by C-G formula based on SCr of 2.1 mg/dL (H)). Liver Function Tests:  Recent Labs Lab 11/27/16 0313  AST 21  ALT 21  ALKPHOS 81  BILITOT 1.3*  PROT 5.7*  ALBUMIN 2.7*   No results for input(s): LIPASE, AMYLASE in the last 168 hours. No results for input(s): AMMONIA in the last 168 hours. Coagulation Profile:  Recent Labs Lab 11/23/16 11/26/16 1852 11/27/16 0313  INR 1.6 3.90 3.92   Cardiac Enzymes: No results for input(s): CKTOTAL, CKMB, CKMBINDEX, TROPONINI in the last 168 hours. BNP (last 3 results) No results for input(s): PROBNP in the last 8760 hours. HbA1C: No results for input(s): HGBA1C in the last 72 hours. CBG: No results for input(s): GLUCAP in the last 168 hours. Lipid Profile: No results for input(s): CHOL, HDL, LDLCALC, TRIG, CHOLHDL, LDLDIRECT in the last 72 hours. Thyroid Function Tests: No results for input(s): TSH, T4TOTAL, FREET4, T3FREE, THYROIDAB in the last 72 hours. Anemia Panel: No results for input(s): VITAMINB12, FOLATE, FERRITIN, TIBC, IRON, RETICCTPCT in the last 72 hours. Urine analysis:    Component Value Date/Time   COLORURINE YELLOW 11/26/2016 2141   APPEARANCEUR HAZY (A) 11/26/2016 2141   LABSPEC 1.023 11/26/2016 2141   PHURINE 5.0 11/26/2016 2141   GLUCOSEU NEGATIVE 11/26/2016 2141   HGBUR NEGATIVE 11/26/2016 2141   BILIRUBINUR NEGATIVE 11/26/2016 2141   KETONESUR NEGATIVE 11/26/2016 2141   PROTEINUR 100 (A) 11/26/2016 2141   UROBILINOGEN 0.2 07/10/2013 1520   NITRITE NEGATIVE 11/26/2016 2141   LEUKOCYTESUR NEGATIVE 11/26/2016 2141    )No results found for this or any  previous visit (from the past 240 hour(s)).    Anti-infectives    None       Radiology Studies: Dg Chest Port 1 View  Result Date: 11/26/2016 CLINICAL DATA:  Shortness of breath EXAM: PORTABLE CHEST 1 VIEW COMPARISON:  10/23/2016 FINDINGS: Normal heart size. No pleural effusion or edema. No airspace opacities identified. Lungs are hyperinflated and there are chronic coarsened interstitial markings compatible with emphysema identified. IMPRESSION: 1. No acute cardiopulmonary abnormalities. 2.  Emphysema (ICD10-J43.9). Electronically Signed   By: Signa Kell M.D.   On: 11/26/2016  21:02        Scheduled Meds: . amiodarone  200 mg Oral Daily  . calcium carbonate  1 tablet Oral Once  . ipratropium-albuterol  3 mL Nebulization Q8H  . latanoprost  1 drop Both Eyes QHS  . levothyroxine  50 mcg Oral QAC breakfast  . magnesium oxide  400 mg Oral Once  . methylPREDNISolone (SOLU-MEDROL) injection  60 mg Intravenous Q6H  . pantoprazole  40 mg Oral Daily  . senna-docusate  1 tablet Oral Daily   Continuous Infusions:   LOS: 0 days    Time spent: 35 min    Preslee Regas U Alando Colleran, DO [facility] Pager [phone]  If 7PM-7AM, please contact night-coverage [email or web address] Password [email or web address] 11/27/2016, 2:07 PM

## 2016-11-28 ENCOUNTER — Inpatient Hospital Stay (HOSPITAL_COMMUNITY): Payer: Medicare Other

## 2016-11-28 LAB — BASIC METABOLIC PANEL
Anion gap: 8 (ref 5–15)
BUN: 30 mg/dL — ABNORMAL HIGH (ref 6–20)
CHLORIDE: 105 mmol/L (ref 101–111)
CO2: 29 mmol/L (ref 22–32)
Calcium: 7.6 mg/dL — ABNORMAL LOW (ref 8.9–10.3)
Creatinine, Ser: 1.87 mg/dL — ABNORMAL HIGH (ref 0.61–1.24)
GFR calc non Af Amer: 29 mL/min — ABNORMAL LOW (ref >60)
GFR, EST AFRICAN AMERICAN: 33 mL/min — AB (ref >60)
GLUCOSE: 224 mg/dL — AB (ref 65–99)
Potassium: 3.7 mmol/L (ref 3.5–5.1)
Sodium: 142 mmol/L (ref 135–145)

## 2016-11-28 LAB — PROTIME-INR
INR: 4.19 — AB
Prothrombin Time: 39.5 seconds — ABNORMAL HIGH (ref 11.4–15.2)

## 2016-11-28 LAB — MAGNESIUM: Magnesium: 2.3 mg/dL (ref 1.7–2.4)

## 2016-11-28 LAB — CALCIUM, IONIZED: Calcium, Ionized, Serum: 4 mg/dL — ABNORMAL LOW (ref 4.5–5.6)

## 2016-11-28 LAB — UREA NITROGEN, URINE: UREA NITROGEN UR: 1085 mg/dL

## 2016-11-28 LAB — CBC
HEMATOCRIT: 34.4 % — AB (ref 39.0–52.0)
HEMOGLOBIN: 11.2 g/dL — AB (ref 13.0–17.0)
MCH: 32.4 pg (ref 26.0–34.0)
MCHC: 32.6 g/dL (ref 30.0–36.0)
MCV: 99.4 fL (ref 78.0–100.0)
Platelets: 224 10*3/uL (ref 150–400)
RBC: 3.46 MIL/uL — ABNORMAL LOW (ref 4.22–5.81)
RDW: 14.7 % (ref 11.5–15.5)
WBC: 10.9 10*3/uL — ABNORMAL HIGH (ref 4.0–10.5)

## 2016-11-28 LAB — GLUCOSE, CAPILLARY
GLUCOSE-CAPILLARY: 190 mg/dL — AB (ref 65–99)
Glucose-Capillary: 166 mg/dL — ABNORMAL HIGH (ref 65–99)

## 2016-11-28 MED ORDER — INSULIN ASPART 100 UNIT/ML ~~LOC~~ SOLN
0.0000 [IU] | Freq: Every day | SUBCUTANEOUS | Status: DC
Start: 2016-11-28 — End: 2016-11-30

## 2016-11-28 MED ORDER — BUDESONIDE 0.25 MG/2ML IN SUSP
0.2500 mg | Freq: Two times a day (BID) | RESPIRATORY_TRACT | Status: DC
  Administered 2016-11-28 – 2016-11-30 (×4): 0.25 mg via RESPIRATORY_TRACT
  Filled 2016-11-28 (×7): qty 2

## 2016-11-28 MED ORDER — INSULIN ASPART 100 UNIT/ML ~~LOC~~ SOLN
0.0000 [IU] | Freq: Three times a day (TID) | SUBCUTANEOUS | Status: DC
  Administered 2016-11-28: 2 [IU] via SUBCUTANEOUS
  Administered 2016-11-29: 3 [IU] via SUBCUTANEOUS

## 2016-11-28 MED ORDER — SODIUM CHLORIDE 0.9 % IV SOLN
1.0000 g | Freq: Once | INTRAVENOUS | Status: AC
  Administered 2016-11-28: 1 g via INTRAVENOUS
  Filled 2016-11-28: qty 10

## 2016-11-28 MED ORDER — METHYLPREDNISOLONE SODIUM SUCC 125 MG IJ SOLR
60.0000 mg | Freq: Two times a day (BID) | INTRAMUSCULAR | Status: DC
  Administered 2016-11-28 – 2016-11-29 (×2): 60 mg via INTRAVENOUS
  Filled 2016-11-28 (×2): qty 2

## 2016-11-28 NOTE — Progress Notes (Signed)
ANTICOAGULATION CONSULT NOTE - Follow-Up  Pharmacy Consult for Warfarin Indication: atrial fibrillation  Patient Measurements: Height:  (172.7 cm) Weight: 184 lb 4.9 oz (83.6 kg) IBW/kg (Calculated) : 68.4  Assessment: 89 YOM who presented on 9/17 s/p fall. The patient was on warfarin 2.5mg  daily exc for 3.75mg  on MWF resumed from PTA for hx Afib. Admit INR 3.9. INR still up at 4.19 today, CBC okay. No bleeding noted.   Goal of Therapy:  INR 2-3 Monitor platelets by anticoagulation protocol: Yes   Plan:  Hold warfarin dose today Monitor daily INR, CBC, s/s of bleed  Thank you for allowing pharmacy to be a part of this patient's care.  Enzo Bi, PharmD, BCPS Clinical Pharmacist Pager [phone] 11/28/2016 9:58 AM

## 2016-11-28 NOTE — Progress Notes (Signed)
CRITICAL VALUE ALERT  Critical Value:  INR 4.19  Date & Time Notied:  11/28/2016 @ 0844  Provider Notified: Dr Benjamine Mola  Orders Received/Actions taken:

## 2016-11-28 NOTE — Progress Notes (Signed)
  Speech Language Pathology Treatment: Dysphagia  Patient Details Name: Jordan [name] MRN: [id number] DOB: 05/21/1918 Today's Date: 11/28/2016 Time: 5573-2202 SLP Time Calculation (min) (ACUTE ONLY): 11 min  Assessment / Plan / Recommendation Clinical Impression  Pt consumed solids and liquids from current diet textures with delayed coughing noted only after pill administration. Per MBS on previous date, coughing that occurred after the pill was swallowed was not in the setting of aspiration, and was felt to be either unrelated to swallowing or potentially due to the sensation of slow esophageal transit. Given similar coughing observed after pill today, suspect that this may be related to h/o esophageal deficits. Would continue with current diet recommendations for now. Will continue to follow for tolerance and potential to try compensatory strategies to facilitate diet advancement.   HPI HPI: Pt is a 83 y.o.maleadmitted s/p fall, found to have acute kidney injury and acute respiratory failure with need for supplemental O2 over the past two weeks per family. Pt had a recent hospital stay 8/13-8/17 followed by 3.5 weeks at SNF, where family says he had a swallow study done (report unavailable) that recommended thickened liquids. PMH includes a fib, HTN, hypthyroidism, COPD, chronic diastolic CHF, GERD, esophageal stricture s/p previous dilations, dyspnea      SLP Plan  Continue with current plan of care       Recommendations  Diet recommendations: Dysphagia 3 (mechanical soft);Nectar-thick liquid Liquids provided via: Cup;No straw Medication Administration: Whole meds with puree (crush larger pills) Supervision: Patient able to self feed;Full supervision/cueing for compensatory strategies Compensations: Minimize environmental distractions;Slow rate;Small sips/bites;Follow solids with liquid;Other (Comment) (take frequent rest breaks) Postural Changes and/or Swallow Maneuvers: Seated  upright 90 degrees;Upright 30-60 min after meal                Oral Care Recommendations: Oral care QID Follow up Recommendations: Skilled Nursing facility SLP Visit Diagnosis: Dysphagia, oropharyngeal phase (R13.12) Plan: Continue with current plan of care       GO                Maxcine Ham 11/28/2016, 10:47 AM  Maxcine Ham, M.A. CCC-SLP [phone]

## 2016-11-28 NOTE — Progress Notes (Signed)
Inpatient Diabetes Program Recommendations  AACE/ADA: New Consensus Statement on Inpatient Glycemic Control (2015)  Target Ranges:  Prepandial:   less than 140 mg/dL      Peak postprandial:   less than 180 mg/dL (1-2 hours)      Critically ill patients:  140 - 180 mg/dL   Results for SACHA, RADLOFF (MRN [id number]) as of 11/28/2016 10:17  Ref. Range 11/26/2016 19:09 11/27/2016 03:13 11/28/2016 07:18  Glucose Latest Ref Range: 65 - 99 mg/dL 409 (H) 92 811 (H)    Admit with: Fall  History: NO History of DM noted      Note patient currently receiving Solumedrol 60 mg BID.  Lab glucose elevated to 224 mg/dl this AM.  MD- Please consider starting Novolog Sensitive Correction Scale/ SSI (0-9 units) TID AC + HS if appropriate for this patient while he is receiving steroids.      --Will follow patient during hospitalization--  Ambrose Finland RN, MSN, CDE Diabetes Coordinator Inpatient Glycemic Control Team Team Pager: [phone] (8a-5p)

## 2016-11-28 NOTE — Evaluation (Signed)
Occupational Therapy Evaluation Patient Details Name: Jordan [name] MRN: [id number] DOB: 1919-02-10 Today's Date: 11/28/2016    History of Present Illness 83 y.o. male admitted on 11/26/16 for fall with left elbow contusion, acute  kidney injury, acute respiratory failure with hypoxia, COPD.  Pt with significant PMH of ventricular bigeminy, PVC, PAF, left hip fx, HTN, COPD, CKD, chronic diasolic heart failure, CHF, acute blood loss anemia, R hip arthroplasty and left hip hemiarthroplasty.  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  Pt was recently admitted on 10/23/16 and was d/c to [facility] for rehab.  He has not been home long when he was admitted this time.     Clinical Impression   Pt admitted with above. He demonstrates the below listed deficits and will benefit from continued OT to maximize safety and independence with BADLs.   Pt requires mod - total A for ADLs.  He was able to sit EOB with min guard assist, and stood with min A for peri care.  02 sats remained >94% on 2L supplemental 02.  Recommend SNF.       Follow Up Recommendations  SNF    Equipment Recommendations  None recommended by OT    Recommendations for Other Services       Precautions / Restrictions Precautions Precautions: Fall      Mobility Bed Mobility Overal bed mobility: Needs Assistance Bed Mobility: Supine to Sit;Sit to Supine     Supine to sit: Min assist Sit to supine: Min assist   General bed mobility comments: verbal cues for sequencing.  Assist to guide LEs off bed and assist to lift trunk   Transfers Overall transfer level: Needs assistance Equipment used: Rolling walker (2 wheeled) Transfers: Sit to/from Stand Sit to Stand: Min assist;From elevated surface         General transfer comment: Pt moved to standing with light min A to steady from elevated bed height     Balance Overall balance assessment: Needs assistance Sitting-balance  support: Feet supported Sitting balance-Leahy Scale: Fair Sitting balance - Comments: Pt maintained EOB sitting x ~20 mins with close min guard assist and verbal cues  Postural control: Right lateral lean Standing balance support: Bilateral upper extremity supported Standing balance-Leahy Scale: Poor Standing balance comment: Pt requires light min A and bil. UE support  for standing EOB for peri care x ~10 mins                            ADL either performed or assessed with clinical judgement   ADL Overall ADL's : Needs assistance/impaired Eating/Feeding: Set up;Supervision/ safety;Bed level Eating/Feeding Details (indicate cue type and reason): Pt was provided with lidded mug as pt has had difficulty holding onto styrofoam cup, and has experienced frequent spills  Grooming: Wash/dry hands;Wash/dry face;Oral care;Minimal assistance;Bed level   Upper Body Bathing: Moderate assistance;Bed level   Lower Body Bathing: Maximal assistance;Sit to/from stand   Upper Body Dressing : Moderate assistance;Sitting   Lower Body Dressing: Total assistance;Sit to/from stand   Toilet Transfer: Minimal assistance;Stand-pivot;BSC;RW   Toileting- Clothing Manipulation and Hygiene: Total assistance;Sit to/from stand Toileting - Clothing Manipulation Details (indicate cue type and reason): Pt with leakage from condom cath.  Assisted with peri care in standing      Functional mobility during ADLs: Minimal assistance;Rolling walker       Vision  Perception     Praxis      Pertinent Vitals/Pain Pain Assessment: Faces Faces Pain Scale: No hurt     Hand Dominance Right   Extremity/Trunk Assessment Upper Extremity Assessment Upper Extremity Assessment: Generalized weakness   Lower Extremity Assessment Lower Extremity Assessment: Defer to PT evaluation   Cervical / Trunk Assessment Cervical / Trunk Assessment: Kyphotic   Communication Communication Communication:  HOH   Cognition Arousal/Alertness: Awake/alert Behavior During Therapy: WFL for tasks assessed/performed Overall Cognitive Status: Impaired/Different from baseline Area of Impairment: Orientation;Attention;Awareness;Problem solving;Safety/judgement                 Orientation Level: Disoriented to;Time;Place (variable ) Current Attention Level: Selective;Sustained   Following Commands: Follows one step commands consistently;Follows multi-step commands inconsistently     Problem Solving: Slow processing;Difficulty sequencing;Requires verbal cues General Comments: Pt intermixing current info with past info    General Comments  02 sats remained >94% on 2L supplemental 02.  DOE 3/4     Exercises     Shoulder Instructions      Home Living Family/patient expects to be discharged to:: Skilled nursing facility Living Arrangements: Children Available Help at Discharge: Family;Available 24 hours/day;Other (Comment) Type of Home: House Home Access: Stairs to enter [employer] of Steps: 2 Entrance Stairs-Rails: Left;Right Home Layout: Two level;Able to live on main level with bedroom/bathroom               Home Equipment: Dan Humphreys - 2 wheels;Toilet riser;Other (comment)          Prior Functioning/Environment Level of Independence: Needs assistance  Gait / Transfers Assistance Needed: min assist for gait household distances with RW, very SOB over the past few days.  ADL's / Homemaking Assistance Needed: total assist for housework, cooking, cleaning.    Comments: Prior to previous SNF placement, pt required assist for socks and shoes.  Since returning home from SNF, he has required total A         OT Problem List: Decreased strength;Decreased activity tolerance;Impaired balance (sitting and/or standing);Decreased safety awareness;Cardiopulmonary status limiting activity;Decreased cognition      OT Treatment/Interventions: Self-care/ADL training;Therapeutic  exercise;Therapeutic activities;Cognitive remediation/compensation;Patient/family education;Balance training;DME and/or AE instruction;Energy conservation    OT Goals(Current goals can be found in the care plan section) Acute Rehab OT Goals Patient Stated Goal: Daughter would like him to be able to increase independence with ADLs and functional mobility  OT Goal Formulation: With patient/family Time For Goal Achievement: 12/12/16 Potential to Achieve Goals: Good ADL Goals Pt Will Perform Grooming: with min assist;standing Pt Will Perform Upper Body Bathing: sitting;with min assist Pt Will Perform Lower Body Bathing: with min assist;sit to/from stand Pt Will Transfer to Toilet: with min assist;ambulating;regular height toilet;bedside commode;grab bars  OT Frequency: Min 2X/week   Barriers to D/C: Decreased caregiver support          Co-evaluation              AM-PAC PT "6 Clicks" Daily Activity     Outcome Measure Help from another person eating meals?: A Little Help from another person taking care of personal grooming?: A Little Help from another person toileting, which includes using toliet, bedpan, or urinal?: Total Help from another person bathing (including washing, rinsing, drying)?: A Lot Help from another person to put on and taking off regular upper body clothing?: A Lot Help from another person to put on and taking off regular lower body clothing?: Total 6 Click Score: 12   End of Session  Equipment Utilized During Treatment: Rolling walker;Oxygen Nurse Communication: Mobility status  Activity Tolerance: Patient tolerated treatment well Patient left: in bed;with call bell/phone within reach;with family/visitor present  OT Visit Diagnosis: Unsteadiness on feet (R26.81)                Time: 2440-1027 OT Time Calculation (min): 51 min Charges:  OT General Charges $OT Visit: 1 Visit OT Evaluation $OT Eval Moderate Complexity: 1 Mod OT Treatments $Self Care/Home  Management : 8-22 mins $Therapeutic Activity: 8-22 mins G-Codes:     [employer], OTR/L [phone]   Jeani Hawking M 11/28/2016, 3:32 PM

## 2016-11-28 NOTE — Progress Notes (Signed)
PROGRESS NOTE    Jordan [name]  ZOX:[id number] DOB: 1918-04-08 DOA: 11/26/2016 PCP: Kaleen Mask, MD   Outpatient Specialists:     Brief Narrative:  Jordan [name] is a 83 y.o. male with medical history significant of A. fib, HTN, hypothyroidism, COPD, chronic diastolic CHF; who presents after a fall.  Patient was admitted into the hospital from 813-8/17, and then sent to [facility] rehabilitation facility where he stayed 3.5 weeks and was discharged home 5 days ago. Family notes that just before he left the rehabilitation facility was noted have drops in his O2 saturations for which he was started on oxygen. Patient never required oxygen previously. There are also issues with his INR fluctuating up to 8 and adjustments had to be made to his Coumadin dosing. Since being home he has progressively declined. Previously, patient could ambulate with use of a rolling walker, but today was not able to even stand on his feet. He fell on Wednesday in the bathroom, and then again today. He hit his left elbow, and it started bleeding significantly. Associated symptoms include weight of leg swelling, and lower abdominal pain. Denies any loss of consciousness. Family notes that the patient's appetite has been otherwise good.    Assessment & Plan:   Active Problems:   Hypertension   Chronic diastolic heart failure (HCC)   Acute kidney injury superimposed on chronic kidney disease (HCC)   Paroxysmal atrial fibrillation (HCC)   Fall at home, initial encounter   Acute respiratory failure with hypoxia (HCC)   Hypokalemia   Hypomagnesemia   Hypocalcemia   AKI (acute kidney injury) (HCC)   Fall with contusion to left elbow: Acute. Suspect due to generalized weakness and deconditioning. - PT Eval- SNF  Dysphagia -SLP eval- MBS- DYS  Diet with thickened liquids  Lower abdominal pain -resolved with large BM  chronic kidney disease stage III: -Cr stable -monitor  Acute respiratory failure  with hypoxia, COPD, with exacerbation:  -Currently requiring 2-3 L to maintain O2 sats greater than 92%. Lungs otherwise sound clear. - chest x-ray: emphysema- has never had PFTs - Duonebs q8hr  and prn q4hr  -added budesonide nebs -IV steroids  Chronic Diastolic CHF:  - BNP normal  Electrolyte abnormalities: Hypokalemia, hypocalcemia, hypomagnesemia: Patient was given initial replacement while in the ED. - Replaced and will recheck  Paroxysmal atrial fibrillation :CHA2DS2-VASc Score 4 - Continue amiodarone -coumadin   Supratherapeutic INR: - Coumadin per pharmacy   Deconditioning:  -PT Eval- SNF  Hypothyroidism - Continue levothyroxine  Gerd - Protonix  Hyperglycemia -no h/o DM -suspect due to steroids -SSI while in hospital  Per family at bedside, patient has been physically declining-- will ask for palliative care eval for GOC  DVT prophylaxis:  Fully anticoagulated  Code Status: Full Code   Family Communication: Daughters at bedside  Disposition Plan:  ?-- SNF   Consultants:   Palliative care   Subjective: Per daughter has stopped having tremors  Objective: Vitals:   11/27/16 1520 11/27/16 2111 11/27/16 2200 11/28/16 0500  BP:  (!) 131/57  (!) 147/69  Pulse: 98 71  71  Resp:  15  16  Temp:  98 F (36.7 C)  98.6 F (37 C)  TempSrc:  Oral  Oral  SpO2: (!) 81% 96% 96% 96%  Weight:    83.6 kg (184 lb 4.9 oz)  Height:        Intake/Output Summary (Last 24 hours) at 11/28/16 1352 Last data filed at 11/28/16 1027  Gross per 24 hour  Intake              240 ml  Output                0 ml  Net              240 ml   Filed Weights   11/26/16 2306 11/27/16 0500 11/28/16 0500  Weight: 83 kg (183 lb) 83 kg (182 lb 15.7 oz) 83.6 kg (184 lb 4.9 oz)    Examination:  General exam: In bed-- pleasantly confused-- "I don't know how I'm doing" speech difficult to understand Respiratory system: diminished, min wheezing heard  today Cardiovascular system: irr Gastrointestinal system: +BS, soft Central nervous system: no focal deficits Extremities:  No LE edema Skin: few bruises     Data Reviewed: I have personally reviewed following labs and imaging studies  CBC:  Recent Labs Lab 11/26/16 1852 11/26/16 1909 11/27/16 0313 11/28/16 0718  WBC 10.1  --  8.9 10.9*  NEUTROABS 8.1*  --   --   --   HGB 12.3* 10.9* 12.1* 11.2*  HCT 37.3* 32.0* 36.7* 34.4*  MCV 99.7  --  100.3* 99.4  PLT 223  --  233 224   Basic Metabolic Panel:  Recent Labs Lab 11/26/16 1852 11/26/16 1909 11/27/16 0313 11/28/16 0718  NA  --  144 148* 142  K  --  3.0* 3.9 3.7  CL  --  101 106 105  CO2  --   --  33* 29  GLUCOSE  --  104* 92 224*  BUN  --  41* 35* 30*  CREATININE  --  2.10* 2.10* 1.87*  CALCIUM 6.7*  --  7.3* 7.6*  MG 1.2*  --  1.2* 2.3  PHOS 2.7  --   --   --    GFR: Estimated Creatinine Clearance: 24.3 mL/min (A) (by C-G formula based on SCr of 1.87 mg/dL (H)). Liver Function Tests:  Recent Labs Lab 11/27/16 0313  AST 21  ALT 21  ALKPHOS 81  BILITOT 1.3*  PROT 5.7*  ALBUMIN 2.7*   No results for input(s): LIPASE, AMYLASE in the last 168 hours. No results for input(s): AMMONIA in the last 168 hours. Coagulation Profile:  Recent Labs Lab 11/23/16 11/26/16 1852 11/27/16 0313 11/28/16 0718  INR 1.6 3.90 3.92 4.19*   Cardiac Enzymes: No results for input(s): CKTOTAL, CKMB, CKMBINDEX, TROPONINI in the last 168 hours. BNP (last 3 results) No results for input(s): PROBNP in the last 8760 hours. HbA1C: No results for input(s): HGBA1C in the last 72 hours. CBG: No results for input(s): GLUCAP in the last 168 hours. Lipid Profile: No results for input(s): CHOL, HDL, LDLCALC, TRIG, CHOLHDL, LDLDIRECT in the last 72 hours. Thyroid Function Tests: No results for input(s): TSH, T4TOTAL, FREET4, T3FREE, THYROIDAB in the last 72 hours. Anemia Panel: No results for input(s): VITAMINB12, FOLATE,  FERRITIN, TIBC, IRON, RETICCTPCT in the last 72 hours. Urine analysis:    Component Value Date/Time   COLORURINE YELLOW 11/26/2016 2141   APPEARANCEUR HAZY (A) 11/26/2016 2141   LABSPEC 1.023 11/26/2016 2141   PHURINE 5.0 11/26/2016 2141   GLUCOSEU NEGATIVE 11/26/2016 2141   HGBUR NEGATIVE 11/26/2016 2141   BILIRUBINUR NEGATIVE 11/26/2016 2141   KETONESUR NEGATIVE 11/26/2016 2141   PROTEINUR 100 (A) 11/26/2016 2141   UROBILINOGEN 0.2 07/10/2013 1520   NITRITE NEGATIVE 11/26/2016 2141   LEUKOCYTESUR NEGATIVE 11/26/2016 2141    )No results found for this  or any previous visit (from the past 240 hour(s)).    Anti-infectives    None       Radiology Studies: Dg Abd 1 View  Result Date: 11/27/2016 CLINICAL DATA:  Abdominal pain EXAM: ABDOMEN - 1 VIEW COMPARISON:  None. FINDINGS: Oral contrast material is seen within the stomach and small bowel. Small hiatal hernia is noted. There is no bowel dilatation to suggest obstruction. There is no evidence of pneumoperitoneum, portal venous gas or pneumatosis. There are no pathologic calcifications along the expected course of the ureters. There bilateral hip arthroplasties. IMPRESSION: No bowel obstruction. Electronically Signed   By: Elige Ko   On: 11/27/2016 14:10   Dg Chest Port 1 View  Result Date: 11/28/2016 CLINICAL DATA:  Wheezing, shortness of breath and cough. EXAM: PORTABLE CHEST 1 VIEW COMPARISON:  Single-view of the chest 11/26/2016, 10/23/2016 and 05/10/2015. CT chest 06/13/2007. FINDINGS: The lungs are emphysematous with basilar scarring, unchanged. No consolidative process, pneumothorax or effusion. There is cardiomegaly. IMPRESSION: Emphysema without acute disease. Cardiomegaly. Electronically Signed   By: Drusilla Kanner M.D.   On: 11/28/2016 08:34   Dg Chest Port 1 View  Result Date: 11/26/2016 CLINICAL DATA:  Shortness of breath EXAM: PORTABLE CHEST 1 VIEW COMPARISON:  10/23/2016 FINDINGS: Normal heart size. No pleural  effusion or edema. No airspace opacities identified. Lungs are hyperinflated and there are chronic coarsened interstitial markings compatible with emphysema identified. IMPRESSION: 1. No acute cardiopulmonary abnormalities. 2.  Emphysema (ICD10-J43.9). Electronically Signed   By: Signa Kell M.D.   On: 11/26/2016 21:02   Dg Swallowing Func-speech Pathology  Result Date: 11/27/2016 Objective Swallowing Evaluation: Type of Study: MBS-Modified Barium Swallow Study Patient Details Name: Jordan [name] MRN: [id number] Date of Birth: [birth year] Today's Date: 11/27/2016 Time: SLP Start Time (ACUTE ONLY): 1336-SLP Stop Time (ACUTE ONLY): 1351 SLP Time Calculation (min) (ACUTE ONLY): 15 min Past Medical History: Past Medical History: Diagnosis Date . Acute blood loss anemia  . CHF (congestive heart failure) (HCC)  . Chronic diastolic heart failure (HCC)  . CKD (chronic kidney disease) stage 3, GFR 30-59 ml/min  . Constipation  . COPD (chronic obstructive pulmonary disease) (HCC)  . Dyspnea  . Ectropion  . Family history of adverse reaction to anesthesia   " daughter has difficulty waking " . GERD (gastroesophageal reflux disease)  . Hypertension  . Intertrochanteric fracture of left hip (HCC)  . Leukocytosis  . Paroxysmal atrial fibrillation (HCC)  . Physical deconditioning  . PVC (premature ventricular contraction)  . Thyroid disease  . Ventricular bigeminy  Past Surgical History: Past Surgical History: Procedure Laterality Date . CHOLECYSTECTOMY   . ESOPHAGOGASTRODUODENOSCOPY N/A 04/26/2013  Procedure: ESOPHAGOGASTRODUODENOSCOPY (EGD);  Surgeon: Florencia Reasons, MD;  Location: [facility] ENDOSCOPY;  Service: Endoscopy;  Laterality: N/A; . ESOPHAGOGASTRODUODENOSCOPY (EGD) WITH ESOPHAGEAL DILATION N/A 04/26/2013  Procedure: ESOPHAGOGASTRODUODENOSCOPY (EGD) WITH ESOPHAGEAL DILATION;  Surgeon: Florencia Reasons, MD;  Location: [facility] ENDOSCOPY;  Service: Endoscopy;  Laterality: N/A; . HIP ARTHROPLASTY Right 04/25/2013  Procedure:  ARTHROPLASTY BIPOLAR HIP;  Surgeon: Nestor Lewandowsky, MD;  Location: [facility] OR;  Service: Orthopedics;  Laterality: Right; . HIP ARTHROPLASTY Left 05/07/2015  Procedure: LEFT HEMI-HIP ARTHROPLASTY CEMENTED;  Surgeon: Gean Birchwood, MD;  Location: [facility] OR;  Service: Orthopedics;  Laterality: Left; . SPLENECTOMY, PARTIAL   HPI: Pt is a 83 y.o.maleadmitted s/p fall, found to have acute kidney injury and acute respiratory failure with need for supplemental O2 over the past two weeks per family. Pt had a recent hospital  stay 8/13-8/17 followed by 3.5 weeks at SNF, where family says he had a swallow study done (report unavailable) that recommended thickened liquids. PMH includes a fib, HTN, hypthyroidism, COPD, chronic diastolic CHF, GERD, esophageal stricture s/p previous dilations, dyspnea Subjective: pt alert, says he gets coughing easily on thin liquids Assessment / Plan / Recommendation CHL IP CLINICAL IMPRESSIONS 11/27/2016 Clinical Impression Pt has a mild oropharyngeal dysphagia with a suspected esophageal component as well, particularly given his h/o esophageal issues. He has decreased bolus cohesion orally with premature spillage with liquid consistencies tested. Liquids reach even to his pyriform sinuses before swallow initiation, which is not necessarily atypical given his advanced age, but I think when coupled with his decreased respriatory status, he does not achieve adequate airway closure in time to protect his airway. Thin liquids were silently aspirated before the swallow, and silent penetration was observed with straw sips of nectar thick liquids. He seemed to have somewhat improved airway protection when using a chin tuck with thin liquids by the cup, but his hearing loss impacted his ability to consistently follow commands for use of strategies. Also of note, pt was observed to have coughing during this study only after the barium tablet appeared to become lodged in the esophagus (MD not present to confirm), and  was not actually associated with airway compromise. Recommend Dys 3 diet for energy conservation and to facilitate esophageal clearance. Would start with nectar thick liquids by cup only, but would try therapeutic trials of thin water with SLP only to work on implementation of a chin tuck. SLP Visit Diagnosis Dysphagia, oropharyngeal phase (R13.12) Attention and concentration deficit following -- Frontal lobe and executive function deficit following -- Impact on safety and function Mild aspiration risk;Moderate aspiration risk   CHL IP TREATMENT RECOMMENDATION 11/27/2016 Treatment Recommendations Therapy as outlined in treatment plan below   Prognosis 11/27/2016 Prognosis for Safe Diet Advancement Good Barriers to Reach Goals -- Barriers/Prognosis Comment -- CHL IP DIET RECOMMENDATION 11/27/2016 SLP Diet Recommendations Dysphagia 3 (Mech soft) solids;Nectar thick liquid Liquid Administration via Cup;No straw Medication Administration Whole meds with puree Compensations Minimize environmental distractions;Slow rate;Small sips/bites;Follow solids with liquid;Other (Comment) Postural Changes Remain semi-upright after after feeds/meals (Comment);Seated upright at 90 degrees   CHL IP OTHER RECOMMENDATIONS 11/27/2016 Recommended Consults -- Oral Care Recommendations Oral care BID Other Recommendations --   CHL IP FOLLOW UP RECOMMENDATIONS 11/27/2016 Follow up Recommendations (No Data)   CHL IP FREQUENCY AND DURATION 11/27/2016 Speech Therapy Frequency (ACUTE ONLY) min 2x/week Treatment Duration 2 weeks      CHL IP ORAL PHASE 11/27/2016 Oral Phase Impaired Oral - Pudding Teaspoon -- Oral - Pudding Cup -- Oral - Honey Teaspoon -- Oral - Honey Cup -- Oral - Nectar Teaspoon -- Oral - Nectar Cup Decreased bolus cohesion Oral - Nectar Straw Decreased bolus cohesion;Premature spillage Oral - Thin Teaspoon -- Oral - Thin Cup Decreased bolus cohesion;Premature spillage Oral - Thin Straw Decreased bolus cohesion;Premature spillage Oral -  Puree WFL Oral - Mech Soft WFL Oral - Regular -- Oral - Multi-Consistency -- Oral - Pill WFL Oral Phase - Comment --  CHL IP PHARYNGEAL PHASE 11/27/2016 Pharyngeal Phase Impaired Pharyngeal- Pudding Teaspoon -- Pharyngeal -- Pharyngeal- Pudding Cup -- Pharyngeal -- Pharyngeal- Honey Teaspoon -- Pharyngeal -- Pharyngeal- Honey Cup -- Pharyngeal -- Pharyngeal- Nectar Teaspoon -- Pharyngeal -- Pharyngeal- Nectar Cup Reduced airway/laryngeal closure Pharyngeal -- Pharyngeal- Nectar Straw Reduced airway/laryngeal closure;Penetration/Aspiration before swallow Pharyngeal Material enters airway, remains ABOVE vocal cords and not ejected  out Pharyngeal- Thin Teaspoon -- Pharyngeal -- Pharyngeal- Thin Cup Reduced airway/laryngeal closure;Penetration/Aspiration before swallow Pharyngeal Material enters airway, passes BELOW cords without attempt by patient to eject out (silent aspiration) Pharyngeal- Thin Straw Penetration/Aspiration before swallow;Reduced airway/laryngeal closure;Compensatory strategies attempted (with notebox) Pharyngeal Material enters airway, remains ABOVE vocal cords and not ejected out Pharyngeal- Puree WFL Pharyngeal -- Pharyngeal- Mechanical Soft WFL Pharyngeal -- Pharyngeal- Regular -- Pharyngeal -- Pharyngeal- Multi-consistency -- Pharyngeal -- Pharyngeal- Pill Pharyngeal residue - valleculae Pharyngeal -- Pharyngeal Comment --  CHL IP CERVICAL ESOPHAGEAL PHASE 11/27/2016 Cervical Esophageal Phase WFL Pudding Teaspoon -- Pudding Cup -- Honey Teaspoon -- Honey Cup -- Nectar Teaspoon -- Nectar Cup -- Nectar Straw -- Thin Teaspoon -- Thin Cup -- Thin Straw -- Puree -- Mechanical Soft -- Regular -- Multi-consistency -- Pill -- Cervical Esophageal Comment -- CHL IP GO 11/27/2016 Functional Assessment Tool Used skilled clinical judgment Functional Limitations Swallowing Swallow Current Status (K4401) CK Swallow Goal Status (U2725) CJ Swallow Discharge Status (D6644) (None) Motor Speech Current Status (I3474)  (None) Motor Speech Goal Status (Q5956) (None) Motor Speech Goal Status (L8756) (None) Spoken Language Comprehension Current Status (E3329) (None) Spoken Language Comprehension Goal Status (J1884) (None) Spoken Language Comprehension Discharge Status (Z6606) (None) Spoken Language Expression Current Status (T0160) (None) Spoken Language Expression Goal Status (F0932) (None) Spoken Language Expression Discharge Status [phone]) (None) Attention Current Status (U2025) (None) Attention Goal Status (K2706) (None) Attention Discharge Status (C3762) (None) Memory Current Status (G3151) (None) Memory Goal Status (V6160) (None) Memory Discharge Status (V3710) (None) Voice Current Status (G2694) (None) Voice Goal Status (W5462) (None) Voice Discharge Status (V0350) (None) Other Speech-Language Pathology Functional Limitation Current Status (K9381) (None) Other Speech-Language Pathology Functional Limitation Goal Status (W2993) (None) Other Speech-Language Pathology Functional Limitation Discharge Status [phone]) (None) Maxcine Ham 11/27/2016, 2:42 PM  Maxcine Ham, M.A. CCC-SLP [phone]                  Scheduled Meds: . amiodarone  200 mg Oral Daily  . budesonide (PULMICORT) nebulizer solution  0.25 mg Nebulization BID  . ipratropium-albuterol  3 mL Nebulization BID  . latanoprost  1 drop Both Eyes QHS  . levothyroxine  50 mcg Oral QAC breakfast  . methylPREDNISolone (SOLU-MEDROL) injection  60 mg Intravenous Q12H  . pantoprazole  40 mg Oral Daily  . senna-docusate  1 tablet Oral Daily  . Warfarin - Pharmacist Dosing Inpatient   Does not apply q1800   Continuous Infusions:   LOS: 1 day    Time spent: 35 min    Daleyssa Loiselle U Seann Genther, DO [facility] Pager [phone]  If 7PM-7AM, please contact night-coverage [email or web address] Password [email or web address] 11/28/2016, 1:52 PM

## 2016-11-29 LAB — PROTIME-INR
INR: 4.22 — AB
Prothrombin Time: 40.3 seconds — ABNORMAL HIGH (ref 11.4–15.2)

## 2016-11-29 LAB — GLUCOSE, CAPILLARY
GLUCOSE-CAPILLARY: 152 mg/dL — AB (ref 65–99)
Glucose-Capillary: 165 mg/dL — ABNORMAL HIGH (ref 65–99)
Glucose-Capillary: 243 mg/dL — ABNORMAL HIGH (ref 65–99)
Glucose-Capillary: 138 mg/dL — ABNORMAL HIGH (ref 65–99)

## 2016-11-29 MED ORDER — TRAZODONE HCL 50 MG PO TABS
25.0000 mg | ORAL_TABLET | Freq: Every day | ORAL | Status: DC
Start: 2016-11-29 — End: 2016-11-29

## 2016-11-29 MED ORDER — TRAZODONE HCL 50 MG PO TABS: 25.0000 mg | ORAL_TABLET | Freq: Every evening | ORAL | Status: DC | PRN

## 2016-11-29 MED ORDER — METHYLPREDNISOLONE SODIUM SUCC 40 MG IJ SOLR
40.0000 mg | Freq: Every day | INTRAMUSCULAR | Status: DC
  Administered 2016-11-30: 40 mg via INTRAVENOUS
  Filled 2016-11-29: qty 1

## 2016-11-29 NOTE — Progress Notes (Signed)
Palliative Medicine RN Note: Rec'd call from daughter Massie Bougie re: setting up family meeting. Patient's son cannot meet until tomorrow, so family meeting is set for tomorrow, 9/20, at 1300.  Margret Chance Bessy Reaney, RN, BSN, [facility] 11/29/2016 12:32 PM Cell [phone] 8:00-4:00 Monday-Friday Office [phone]

## 2016-11-29 NOTE — Progress Notes (Signed)
PT Cancellation Note  Patient Details Name: Jordan [name] MRN: [id number] DOB: 1918-08-17   Cancelled Treatment:    Reason Eval/Treat Not Completed: Patient declined, no reason specified Pt declined working with PT as he reports he just got back in bed and wants to rest. Will follow up.   Blake Divine A Danyla Wattley 11/29/2016, 10:58 AM  Mylo Red, PT, DPT [phone]

## 2016-11-29 NOTE — Care Management Note (Addendum)
Case Management Note  Patient Details  Name: Jordan [name] MRN: [id number] Date of Birth: March 19, 1918  Subjective/Objective:   Acute resp failure, COPD exac, home oxygen, Fall at home, recent stay at [facility] with 3.5 week stay, plan GOC meeting 11/30/2016 at 1 pm with family                  Action/Plan: Discharge Planning: Chart reviewed. Waiting GOC meeting with pt's family on 11/30/2016 to determine dc plan. Home Hospice vs HH. Will need PT/OT evaluation. Spoke to pt and dtr, [name] at bedside. Lives in home with dtr, [name].  Pt is active with [facility] for HH. He has RW, RW with seat and bedside commode. His home oxygen is through [facility]. Dtr states they would be agreeable to him going back to SNF[facility], she is aware of cost of 167 per day they would have to pay. SNF vs HH or Hospice.  PCP Kaleen Mask   Expected Discharge Date:                  Expected Discharge Plan:  Home w Hospice Care  In-House Referral:  Clinical Social Work  Discharge planning Services  CM Consult  Post Acute Care Choice:  NA Choice offered to:  NA  DME Arranged:  N/A DME Agency:  NA  HH Arranged:  NA HH Agency:  NA  Status of Service:  In process, will continue to follow  If discussed at Long Length of Stay Meetings, dates discussed:    Additional Comments:  [name], RN 11/29/2016, 2:24 PM

## 2016-11-29 NOTE — Progress Notes (Signed)
  Speech Language Pathology Treatment: Dysphagia  Patient Details Name: Jordan [name] MRN: [id number] DOB: [birth year] Today's Date: 11/29/2016 Time: 1350-1405 SLP Time Calculation (min) (ACUTE ONLY): 15 min  Assessment / Plan / Recommendation Clinical Impression  Skilled treatment session focused largely on education with daughter present. Pt also appeared with labored breathing but O2 was within functional limits (98%). Daughter states that family has already ordered nectar thick to use at home (if he returns home). This daughter isn't hopeful for return to thin liquids d/t pt's level of confusion and inability to demonstrate consistent chin tuck. Pt with confusion this session and no thin liquids attempted as pt couldn't imitate chin tuck. All questions answered to family satisfaction.     HPI HPI: Pt is a 83 y.o.maleadmitted s/p fall, found to have acute kidney injury and acute respiratory failure with need for supplemental O2 over the past two weeks per family. Pt had a recent hospital stay 8/13-8/17 followed by 3.5 weeks at SNF, where family says he had a swallow study done (report unavailable) that recommended thickened liquids. PMH includes a fib, HTN, hypthyroidism, COPD, chronic diastolic CHF, GERD, esophageal stricture s/p previous dilations, dyspnea      SLP Plan  Continue with current plan of care       Recommendations  Diet recommendations: Dysphagia 3 (mechanical soft);Nectar-thick liquid Liquids provided via: Cup;No straw Medication Administration: Whole meds with puree Supervision: Patient able to self feed;Full supervision/cueing for compensatory strategies Compensations: Minimize environmental distractions;Slow rate;Small sips/bites;Follow solids with liquid;Other (Comment) Postural Changes and/or Swallow Maneuvers: Seated upright 90 degrees;Upright 30-60 min after meal                Oral Care Recommendations: Oral care QID Follow up Recommendations: Skilled  Nursing facility SLP Visit Diagnosis: Dysphagia, oropharyngeal phase (R13.12) Plan: Continue with current plan of care       GO                Shauntia Levengood 11/29/2016, 3:05 PM

## 2016-11-29 NOTE — Progress Notes (Signed)
CRITICAL VALUE ALERT  Critical Value:  INR = 4.22  Date & Time Notied:  0605  Provider Notified: Kirtland Bouchard Schorr  Orders Received/Actions taken: Pt's coumadin remains on hold

## 2016-11-29 NOTE — Progress Notes (Signed)
No charge note:   Palliative consult received.   Message left to arrange GOC meeting with family.  Ocie Bob, AGNP-C Palliative Medicine  Please call Palliative Medicine team phone with any questions [phone]. For individual providers please see AMION.

## 2016-11-29 NOTE — Progress Notes (Signed)
PROGRESS NOTE    Jordan [name]  WUJ:[id number] DOB: 25-Dec-1918 DOA: 11/26/2016 PCP: Kaleen Mask, MD   Outpatient Specialists:     Brief Narrative:  Jordan [name] is a 83 y.o. male with medical history significant of A. fib, HTN, hypothyroidism, COPD, chronic diastolic CHF; who presents after a fall.  Patient was admitted into the hospital from 813-8/17, and then sent to [facility] rehabilitation facility where he stayed 3.5 weeks and was discharged home 5 days ago. Family notes that just before he left the rehabilitation facility was noted have drops in his O2 saturations for which he was started on oxygen. Patient never required oxygen previously. There are also issues with his INR fluctuating up to 8 and adjustments had to be made to his Coumadin dosing. Since being home he has progressively declined. Suspect a COPD exacerbation but will need palliative care meeting for GOC.   Assessment & Plan:   Active Problems:   Hypertension   Chronic diastolic heart failure (HCC)   Acute kidney injury superimposed on chronic kidney disease (HCC)   Paroxysmal atrial fibrillation (HCC)   Fall at home, initial encounter   Acute respiratory failure with hypoxia (HCC)   Hypokalemia   Hypomagnesemia   Hypocalcemia   AKI (acute kidney injury) (HCC)   Fall with contusion to left elbow: Acute. Suspect due to generalized weakness and deconditioning. - PT Eval- SNF  Dysphagia -SLP eval- MBS- DYS  Diet with thickened liquids  Lower abdominal pain -resolved with large BM  chronic kidney disease stage III: -Cr stable -BMP in AM  Acute respiratory failure with hypoxia, COPD, with exacerbation:  -Currently requiring 2-3 L to maintain O2 sats greater than 92%. Lungs otherwise sound clear-- wean as tolerated - chest x-ray: emphysema- has never had PFTs - Duonebs q8hr  and prn q4hr  -added budesonide nebs -IV steroids- wean to off as tolerated  Chronic Diastolic CHF:  - BNP  normal  Electrolyte abnormalities: Hypokalemia, hypocalcemia, hypomagnesemia: Patient was given initial replacement while in the ED. - Replaced  Paroxysmal atrial fibrillation :CHA2DS2-VASc Score 4 - Continue amiodarone -coumadin per pharmacy   Supratherapeutic INR: - Coumadin per pharmacy   Deconditioning:  -PT Eval- SNF  Hypothyroidism - Continue levothyroxine  Gerd - Protonix  Hyperglycemia -no h/o DM -suspect due to steroids -SSI while in hospital  Per family at bedside, patient has been physically declining for months-- palliative care evaluation for GOC   DVT prophylaxis:  Fully anticoagulated  Code Status: Full Code   Family Communication: Daughters at bedside  Disposition Plan:  ?-- SNF   Consultants:   Palliative care   Subjective: Confused this AM  Objective: Vitals:   11/29/16 0602 11/29/16 0801 11/29/16 0803 11/29/16 0942  BP: (!) 142/99   (!) 131/56  Pulse: 79   91  Resp: (!) 22   (!) 25  Temp: 97.8 F (36.6 C)   98.1 F (36.7 C)  TempSrc:    Oral  SpO2: 96% 93% 98% 96%  Weight:      Height:        Intake/Output Summary (Last 24 hours) at 11/29/16 1414 Last data filed at 11/29/16 1040  Gross per 24 hour  Intake              600 ml  Output              250 ml  Net              350 ml  Filed Weights   11/27/16 0500 11/28/16 0500 11/28/16 2027  Weight: 83 kg (182 lb 15.7 oz) 83.6 kg (184 lb 4.9 oz) 83.5 kg (184 lb)    Examination:  General exam: resting in bed, chronically ill appearing, redness around left eye Respiratory system: diminished, no wheezing today Cardiovascular system: irr Gastrointestinal system: +BS, soft Central nervous system: moves all 4 ext-- hard of hearing but following commands Extremities:  No LE edema Skin: few areas with bruising     Data Reviewed: I have personally reviewed following labs and imaging studies  CBC:  Recent Labs Lab 11/26/16 1852 11/26/16 1909 11/27/16 0313  11/28/16 0718  WBC 10.1  --  8.9 10.9*  NEUTROABS 8.1*  --   --   --   HGB 12.3* 10.9* 12.1* 11.2*  HCT 37.3* 32.0* 36.7* 34.4*  MCV 99.7  --  100.3* 99.4  PLT 223  --  233 224   Basic Metabolic Panel:  Recent Labs Lab 11/26/16 1852 11/26/16 1909 11/27/16 0313 11/28/16 0718  NA  --  144 148* 142  K  --  3.0* 3.9 3.7  CL  --  101 106 105  CO2  --   --  33* 29  GLUCOSE  --  104* 92 224*  BUN  --  41* 35* 30*  CREATININE  --  2.10* 2.10* 1.87*  CALCIUM 6.7*  --  7.3* 7.6*  MG 1.2*  --  1.2* 2.3  PHOS 2.7  --   --   --    GFR: Estimated Creatinine Clearance: 24.3 mL/min (A) (by C-G formula based on SCr of 1.87 mg/dL (H)). Liver Function Tests:  Recent Labs Lab 11/27/16 0313  AST 21  ALT 21  ALKPHOS 81  BILITOT 1.3*  PROT 5.7*  ALBUMIN 2.7*   No results for input(s): LIPASE, AMYLASE in the last 168 hours. No results for input(s): AMMONIA in the last 168 hours. Coagulation Profile:  Recent Labs Lab 11/23/16 11/26/16 1852 11/27/16 0313 11/28/16 0718 11/29/16 0305  INR 1.6 3.90 3.92 4.19* 4.22*   Cardiac Enzymes: No results for input(s): CKTOTAL, CKMB, CKMBINDEX, TROPONINI in the last 168 hours. BNP (last 3 results) No results for input(s): PROBNP in the last 8760 hours. HbA1C: No results for input(s): HGBA1C in the last 72 hours. CBG:  Recent Labs Lab 11/28/16 1646 11/28/16 2125 11/29/16 0912 11/29/16 1152  GLUCAP 166* 190* 165* 243*   Lipid Profile: No results for input(s): CHOL, HDL, LDLCALC, TRIG, CHOLHDL, LDLDIRECT in the last 72 hours. Thyroid Function Tests: No results for input(s): TSH, T4TOTAL, FREET4, T3FREE, THYROIDAB in the last 72 hours. Anemia Panel: No results for input(s): VITAMINB12, FOLATE, FERRITIN, TIBC, IRON, RETICCTPCT in the last 72 hours. Urine analysis:    Component Value Date/Time   COLORURINE YELLOW 11/26/2016 2141   APPEARANCEUR HAZY (A) 11/26/2016 2141   LABSPEC 1.023 11/26/2016 2141   PHURINE 5.0 11/26/2016 2141    GLUCOSEU NEGATIVE 11/26/2016 2141   HGBUR NEGATIVE 11/26/2016 2141   BILIRUBINUR NEGATIVE 11/26/2016 2141   KETONESUR NEGATIVE 11/26/2016 2141   PROTEINUR 100 (A) 11/26/2016 2141   UROBILINOGEN 0.2 07/10/2013 1520   NITRITE NEGATIVE 11/26/2016 2141   LEUKOCYTESUR NEGATIVE 11/26/2016 2141    )No results found for this or any previous visit (from the past 240 hour(s)).    Anti-infectives    None       Radiology Studies: Dg Chest Port 1 View  Result Date: 11/28/2016 CLINICAL DATA:  Wheezing, shortness of breath and  cough. EXAM: PORTABLE CHEST 1 VIEW COMPARISON:  Single-view of the chest 11/26/2016, 10/23/2016 and 05/10/2015. CT chest 06/13/2007. FINDINGS: The lungs are emphysematous with basilar scarring, unchanged. No consolidative process, pneumothorax or effusion. There is cardiomegaly. IMPRESSION: Emphysema without acute disease. Cardiomegaly. Electronically Signed   By: Drusilla Kanner M.D.   On: 11/28/2016 08:34        Scheduled Meds: . amiodarone  200 mg Oral Daily  . budesonide (PULMICORT) nebulizer solution  0.25 mg Nebulization BID  . insulin aspart  0-5 Units Subcutaneous QHS  . insulin aspart  0-9 Units Subcutaneous TID WC  . ipratropium-albuterol  3 mL Nebulization BID  . latanoprost  1 drop Both Eyes QHS  . levothyroxine  50 mcg Oral QAC breakfast  . [START ON 11/30/2016] methylPREDNISolone (SOLU-MEDROL) injection  40 mg Intravenous Daily  . pantoprazole  40 mg Oral Daily  . senna-docusate  1 tablet Oral Daily  . Warfarin - Pharmacist Dosing Inpatient   Does not apply q1800   Continuous Infusions:   LOS: 2 days    Time spent: 35 min    Rekha Hobbins U Jazzy Parmer, DO [facility] Pager [phone]  If 7PM-7AM, please contact night-coverage [email or web address] Password [email or web address] 11/29/2016, 2:14 PM

## 2016-11-29 NOTE — Progress Notes (Signed)
ANTICOAGULATION CONSULT NOTE - Follow-Up  Pharmacy Consult for Warfarin Indication: atrial fibrillation  Patient Measurements: Height:  (172.7 cm) Weight: 184 lb (83.5 kg) IBW/kg (Calculated) : 68.4  Assessment: 89 YOM who presented on 9/17 s/p fall. The patient was on warfarin 2.5mg  daily exc for 3.75mg  on MWF resumed from PTA for hx Afib. Admit INR 3.9. INR still up at 4.22 today, CBC okay. No bleeding noted. Will probably start trending down tomorrow.  Goal of Therapy:  INR 2-3 Monitor platelets by anticoagulation protocol: Yes   Plan:  Hold warfarin dose today Monitor daily INR, CBC, s/s of bleed   Enzo Bi, PharmD, [facility] Clinical Pharmacist Pager [phone] 11/29/2016 7:57 AM

## 2016-11-30 DIAGNOSIS — Z7189 Other specified counseling: Secondary | ICD-10-CM

## 2016-11-30 LAB — GLUCOSE, CAPILLARY
GLUCOSE-CAPILLARY: 108 mg/dL — AB (ref 65–99)
GLUCOSE-CAPILLARY: 119 mg/dL — AB (ref 65–99)
GLUCOSE-CAPILLARY: 150 mg/dL — AB (ref 65–99)

## 2016-11-30 LAB — BASIC METABOLIC PANEL
ANION GAP: 7 (ref 5–15)
BUN: 36 mg/dL — ABNORMAL HIGH (ref 6–20)
CHLORIDE: 106 mmol/L (ref 101–111)
CO2: 30 mmol/L (ref 22–32)
Calcium: 7.8 mg/dL — ABNORMAL LOW (ref 8.9–10.3)
Creatinine, Ser: 1.77 mg/dL — ABNORMAL HIGH (ref 0.61–1.24)
GFR calc Af Amer: 36 mL/min — ABNORMAL LOW (ref >60)
GFR, EST NON AFRICAN AMERICAN: 31 mL/min — AB (ref >60)
Glucose, Bld: 117 mg/dL — ABNORMAL HIGH (ref 65–99)
POTASSIUM: 3.8 mmol/L (ref 3.5–5.1)
Sodium: 143 mmol/L (ref 135–145)

## 2016-11-30 LAB — CBC
HCT: 35.8 % — ABNORMAL LOW (ref 39.0–52.0)
HEMOGLOBIN: 11.7 g/dL — AB (ref 13.0–17.0)
MCH: 32.9 pg (ref 26.0–34.0)
MCHC: 32.7 g/dL (ref 30.0–36.0)
MCV: 100.6 fL — AB (ref 78.0–100.0)
PLATELETS: 263 10*3/uL (ref 150–400)
RBC: 3.56 MIL/uL — ABNORMAL LOW (ref 4.22–5.81)
RDW: 14.7 % (ref 11.5–15.5)
WBC: 17.5 10*3/uL — ABNORMAL HIGH (ref 4.0–10.5)

## 2016-11-30 LAB — PROTIME-INR
INR: 3.9
PROTHROMBIN TIME: 37.9 s — AB (ref 11.4–15.2)

## 2016-11-30 MED ORDER — MORPHINE SULFATE (PF) 2 MG/ML IV SOLN
2.0000 mg | Freq: Once | INTRAVENOUS | Status: AC
  Administered 2016-12-01: 2 mg via INTRAVENOUS
  Filled 2016-11-30: qty 1

## 2016-11-30 MED ORDER — LORAZEPAM 2 MG/ML IJ SOLN
1.0000 mg | INTRAMUSCULAR | Status: DC | PRN
  Administered 2016-12-01: 1 mg via INTRAVENOUS
  Filled 2016-11-30 (×2): qty 1

## 2016-11-30 MED ORDER — MORPHINE SULFATE (CONCENTRATE) 10 MG/0.5ML PO SOLN
5.0000 mg | ORAL | Status: DC | PRN
  Filled 2016-11-30: qty 0.5

## 2016-11-30 MED ORDER — MORPHINE SULFATE (CONCENTRATE) 10 MG/0.5ML PO SOLN
2.5000 mg | ORAL | Status: DC | PRN
  Administered 2016-11-30: 2.6 mg via SUBLINGUAL
  Filled 2016-11-30: qty 0.5

## 2016-11-30 MED ORDER — LORAZEPAM 2 MG/ML PO CONC: 1.0000 mg | ORAL | Status: DC | PRN

## 2016-11-30 MED ORDER — HALOPERIDOL LACTATE 5 MG/ML IJ SOLN: 0.5000 mg | INTRAMUSCULAR | Status: DC | PRN

## 2016-11-30 MED ORDER — LORAZEPAM 1 MG PO TABS: 1.0000 mg | ORAL_TABLET | ORAL | Status: DC | PRN

## 2016-11-30 MED ORDER — HALOPERIDOL 0.5 MG PO TABS
0.5000 mg | ORAL_TABLET | ORAL | Status: DC | PRN
Start: 2016-11-30 — End: 2016-12-01
  Filled 2016-11-30: qty 1

## 2016-11-30 MED ORDER — HALOPERIDOL LACTATE 2 MG/ML PO CONC
0.5000 mg | ORAL | Status: DC | PRN
  Filled 2016-11-30: qty 0.3

## 2016-11-30 MED ORDER — MORPHINE SULFATE (CONCENTRATE) 10 MG/0.5ML PO SOLN
5.0000 mg | ORAL | Status: DC | PRN
  Administered 2016-11-30: 5 mg via SUBLINGUAL
  Filled 2016-11-30: qty 0.5

## 2016-11-30 MED ORDER — MORPHINE SULFATE (CONCENTRATE) 10 MG/0.5ML PO SOLN
2.5000 mg | ORAL | Status: AC
  Administered 2016-11-30: 2.6 mg via SUBLINGUAL

## 2016-11-30 NOTE — Progress Notes (Signed)
PROGRESS NOTE    Jordan [name]  XBJ:[id number] DOB: 1918-05-20 DOA: 11/26/2016 PCP: Kaleen Mask, MD    Brief Narrative: Jordan [name] a 83 y.o.malewith medical history significant ofA. fib, HTN, hypothyroidism, COPD, chronic diastolic CHF; who presents after a fall. Patient was admitted into the hospital from 813-8/17,and then sent to [facility] facility where he stayed 3.5weeks and was discharged home5 days prior to admission. He was brought in by family for deconditioning, a fall at home, and possible copd exacerbation, ? Aspiration.   Assessment & Plan:   Active Problems:   Hypertension   Chronic diastolic heart failure (HCC)   Acute kidney injury superimposed on chronic kidney disease (HCC)   Paroxysmal atrial fibrillation (HCC)   Fall at home, initial encounter   Acute respiratory failure with hypoxia (HCC)   Hypokalemia   Hypomagnesemia   Hypocalcemia   AKI (acute kidney injury) (HCC)   Functional decline, multiple falls despite upto 4 weeks of therapy, generalized weakness and deconditioning.  Palliative care consulted and plan for residential hospice with comfort as the goal.     Dysphagia:  On dysphagia diet with thickened liquids.    Stage3 CKD: creatinine at baselin.    Acute respiratory failure with hypoxia:  ? On going aspiration vs copd exacerbation:  [place] oxygen to keep sats greater than 90%.  duonebs as needed.   PAF:  Rate controlled resume amio,but would discontinue coumadin as his goal is comfort.    Hypertension sub optimally controlled.   DVT prophylaxis: comfort care.  Code Status: DNR Family Communication:family at bedside.  Disposition Plan: residential hospice.   Consultants:   Palliative care.   Procedures:none.  Antimicrobials:none.   Subjective: Reports sob.   Objective: Vitals:   11/29/16 2042 11/30/16 0623 11/30/16 0854 11/30/16 0924  BP: (!) 141/65 (!) 166/66  (!) 166/59  Pulse: 91 81 80 82   Resp: (!) 22 (!) Temp: 98.1 F (36.7 C) 98.1 F (36.7 C)  98 F (36.7 C)  TempSrc:    Oral  SpO2: 99% 93%  99%  Weight: 83.5 kg (184 lb)     Height:        Intake/Output Summary (Last 24 hours) at 11/30/16 1620 Last data filed at 11/30/16 1100  Gross per 24 hour  Intake              480 ml  Output              175 ml  Net              305 ml   Filed Weights   11/28/16 0500 11/28/16 2027 11/29/16 2042  Weight: 83.6 kg (184 lb 4.9 oz) 83.5 kg (184 lb) 83.5 kg (184 lb)    Examination:  General exam: Appears in mild discomfort from sob. On 2l it of [employer] oxygen.  Respiratory system: basilar rales. Air entry fair.  Cardiovascular system: S1 & S2 heard, RRR. No JVD, murmurs, Gastrointestinal system: Abdomen is nondistended, soft and nontender. No organomegaly or masses felt. Normal bowel sounds heard. Central nervous system: Alert and appears to be oriented. Able to move all extremities.  Extremities: able to move all extremities, no cyanosis or clubbing.  Skin: No rashes, lesions or ulcers Psychiatry:Mood & affect appropriate.     Data Reviewed: I have personally reviewed following labs and imaging studies  CBC:  Recent Labs Lab 11/26/16 1852 11/26/16 1909 11/27/16 0313 11/28/16 0718 11/30/16 0238  WBC 10.1  --  8.9 10.9* 17.5*  NEUTROABS 8.1*  --   --   --   --   HGB 12.3* 10.9* 12.1* 11.2* 11.7*  HCT 37.3* 32.0* 36.7* 34.4* 35.8*  MCV 99.7  --  100.3* 99.4 100.6*  PLT 223  --  233 224 263   Basic Metabolic Panel:  Recent Labs Lab 11/26/16 1852 11/26/16 1909 11/27/16 0313 11/28/16 0718 11/30/16 0238  NA  --  144 148* 142 143  K  --  3.0* 3.9 3.7 3.8  CL  --  101 106 105 106  CO2  --   --  33* 29 30  GLUCOSE  --  104* 92 224* 117*  BUN  --  41* 35* 30* 36*  CREATININE  --  2.10* 2.10* 1.87* 1.77*  CALCIUM 6.7*  --  7.3* 7.6* 7.8*  MG 1.2*  --  1.2* 2.3  --   PHOS 2.7  --   --   --   --    GFR: Estimated Creatinine Clearance: 25.7 mL/min  (A) (by C-G formula based on SCr of 1.77 mg/dL (H)). Liver Function Tests:  Recent Labs Lab 11/27/16 0313  AST 21  ALT 21  ALKPHOS 81  BILITOT 1.3*  PROT 5.7*  ALBUMIN 2.7*   No results for input(s): LIPASE, AMYLASE in the last 168 hours. No results for input(s): AMMONIA in the last 168 hours. Coagulation Profile:  Recent Labs Lab 11/26/16 1852 11/27/16 0313 11/28/16 0718 11/29/16 0305 11/30/16 0238  INR 3.90 3.92 4.19* 4.22* 3.90   Cardiac Enzymes: No results for input(s): CKTOTAL, CKMB, CKMBINDEX, TROPONINI in the last 168 hours. BNP (last 3 results) No results for input(s): PROBNP in the last 8760 hours. HbA1C: No results for input(s): HGBA1C in the last 72 hours. CBG:  Recent Labs Lab 11/29/16 1704 11/29/16 2138 11/30/16 0736 11/30/16 1222 11/30/16 1611  GLUCAP 138* 152* 108* 119* 150*   Lipid Profile: No results for input(s): CHOL, HDL, LDLCALC, TRIG, CHOLHDL, LDLDIRECT in the last 72 hours. Thyroid Function Tests: No results for input(s): TSH, T4TOTAL, FREET4, T3FREE, THYROIDAB in the last 72 hours. Anemia Panel: No results for input(s): VITAMINB12, FOLATE, FERRITIN, TIBC, IRON, RETICCTPCT in the last 72 hours. Sepsis Labs: No results for input(s): PROCALCITON, LATICACIDVEN in the last 168 hours.  No results found for this or any previous visit (from the past 240 hour(s)).       Radiology Studies: No results found.      Scheduled Meds: . latanoprost  1 drop Both Eyes QHS  . senna-docusate  1 tablet Oral Daily   Continuous Infusions:   LOS: 3 days    Time spent: 35 minutes.     Kathlen Mody, MD [facility] Pager [phone]   If 7PM-7AM, please contact night-coverage [email or web address] Password [email or web address] 11/30/2016, 4:20 PM

## 2016-11-30 NOTE — Progress Notes (Signed)
ANTICOAGULATION CONSULT NOTE - Follow-Up  Pharmacy Consult for Warfarin Indication: atrial fibrillation  Patient Measurements: Height:  (172.7 cm) Weight: 184 lb (83.5 kg) IBW/kg (Calculated) : 68.4  Vital Signs: Temp: 98 F (36.7 C) (09/20 0924) Temp Source: Oral (09/20 0924) BP: 166/59 (09/20 0924) Pulse Rate: 82 (09/20 0924)  Labs:  Recent Labs  11/28/16 0718 11/29/16 0305 11/30/16 0238  HGB 11.2*  --  11.7*  HCT 34.4*  --  35.8*  PLT 224  --  263  LABPROT 39.5* 40.3* 37.9*  INR 4.19* 4.22* 3.90  CREATININE 1.87*  --  1.77*    Estimated Creatinine Clearance: 25.7 mL/min (A) (by C-G formula based on SCr of 1.77 mg/dL (H)).  Assessment: 36 YOM who presented on 9/17 s/p fall. The patient was on warfarin PTA for hx Afib. Admit INR elevated at 3.92 on PTA dosing of 2.5 mg TThSatSun, 3.75 mg MWF. Pharmacy consulted to resume dosing this admission.  INR today remains SUPRAtherapeutic however trending back down (INR 3.9 << 4.22, goal of 2-3). CBC okay, no bleeding noted at this time.   Goal of Therapy:  INR 2-3 Monitor platelets by anticoagulation protocol: Yes   Plan:  1. Hold warfarin dose today 2. Will continue to monitor for any signs/symptoms of bleeding and will follow up with PT/INR in the a.m.   Thank you for allowing pharmacy to be a part of this patient's care.  Georgina Pillion, PharmD, BCPS Clinical Pharmacist Pager: [phone] Clinical phone for 11/30/2016 from 7a-3:30p: [phone] If after 3:30p, please call main pharmacy at: [phone] 11/30/2016 1:22 PM

## 2016-11-30 NOTE — Progress Notes (Signed)
Occupational Therapy Treatment and Discharge Patient Details Name: Jordan [name] MRN: [id number] DOB: [birth year] Today's Date: 11/30/2016    History of present illness 83 y.o. male admitted on 11/26/16 for fall with left elbow contusion, acute  kidney injury, acute respiratory failure with hypoxia, COPD.  Pt with significant PMH of ventricular bigeminy, PVC, PAF, left hip fx, HTN, COPD, CKD, chronic diasolic heart failure, CHF, acute blood loss anemia, R hip arthroplasty and left hip hemiarthroplasty.  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  Pt was recently admitted on 10/23/16 and was d/c to [facility] for rehab.  He has not been home long when he was admitted this time.     OT comments  Assisted pt with positioning in bed. Pt with labored breathing and hallucinations. Family has decided on comfort care for pt. Signing off.  Follow Up Recommendations  SNF (vs Hospice)    Equipment Recommendations  None recommended by OT    Recommendations for Other Services      Precautions / Restrictions Precautions Precautions: Fall       Mobility Bed Mobility Overal bed mobility: Needs Assistance             General bed mobility comments: total assist to pull up in bed and elevate head to ease breathing, replaced nasal canula  Transfers                 General transfer comment: deferred    Balance                                           ADL either performed or assessed with clinical judgement   ADL                                               Vision       Perception     Praxis      Cognition Arousal/Alertness: Lethargic Behavior During Therapy: Flat affect Overall Cognitive Status: Impaired/Different from baseline                                 General Comments: pt with hallucinations, not following commands, but cooperative        Exercises      Shoulder Instructions       General Comments      Pertinent Vitals/ Pain       Faces Pain Scale: No hurt  Home Living                                          Prior Functioning/Environment              Frequency           Progress Toward Goals  OT Goals(current goals can now be found in the care plan section)  Progress towards OT goals: Not progressing toward goals - comment (comfort care)  Acute Rehab OT Goals Patient Stated Goal: family has decided on comfort care  Plan Other (comment) (discharged, family has decided on  comfort measures)    Co-evaluation                 AM-PAC PT "6 Clicks" Daily Activity     Outcome Measure   Help from another person eating meals?: Total Help from another person taking care of personal grooming?: Total Help from another person toileting, which includes using toliet, bedpan, or urinal?: Total Help from another person bathing (including washing, rinsing, drying)?: Total Help from another person to put on and taking off regular upper body clothing?: Total Help from another person to put on and taking off regular lower body clothing?: Total 6 Click Score: 6    End of Session Equipment Utilized During Treatment: Oxygen  OT Visit Diagnosis: Unsteadiness on feet (R26.81)   Activity Tolerance Patient limited by fatigue;Patient limited by lethargy   Patient Left in bed;with call bell/phone within reach;with family/visitor present   Nurse Communication Other (comment) (daughter wants pt to have morphine)        Time: 1610-9604 OT Time Calculation (min): 13 min  Charges: OT General Charges $OT Visit: 1 Visit OT Treatments $Therapeutic Activity: 8-22 mins  11/30/2016 Martie Round, OTR/L Pager: [phone]  Iran Planas, [place] B[employer] 11/30/2016, 3:17 PM

## 2016-11-30 NOTE — Care Management Note (Signed)
Case Management Note  Patient Details  Name: Jordan [name] MRN: [id number] Date of Birth: 02/22/1919  Subjective/Objective:          CM following for progression and d/c planning.          Action/Plan: 11/30/2016 Noted pt declining and plan to d/c to residential hospice. Noted family preferrence for [facility]. CSW, Genelle Bal working with family re this d/c.   Expected Discharge Date:    11/30/2016              Expected Discharge Plan:  Hospice Medical Facility  In-House Referral:  Clinical Social Work  Discharge planning Services  CM Consult  Post Acute Care Choice:  NA Choice offered to:  NA  DME Arranged:  N/A DME Agency:  NA  HH Arranged:  NA HH Agency:  NA  Status of Service:  Completed, signed off  If discussed at [employer] of Stay Meetings, dates discussed:    Additional Comments:  Starlyn Skeans, RN 11/30/2016, 4:06 PM

## 2016-11-30 NOTE — Progress Notes (Signed)
PT Cancellation Note  Patient Details Name: DESMAN POLAK MRN: [id number] DOB: 02/05/1919   Cancelled Treatment:    Reason Eval/Treat Not Completed: Other (comment).  Pt was occupied during two attempts, and now is getting bathed.  Will try again tomorrow.   Ivar Drape 11/30/2016, 2:36 PM   Samul Dada, PT MS Acute Rehab Dept. Number: [facility] [id number] and [facility] [phone]

## 2016-11-30 NOTE — Consult Note (Signed)
Consultation Note Date: 11/30/2016   Patient Name: Jordan [name]  DOB: 1918/08/01  MRN: [id number]  Age / Sex: 83 y.o., male  PCP: Leonard Downing, MD Referring Physician: Hosie Poisson, MD  Reason for Consultation: Establishing goals of care  HPI/Patient Profile: 83 y.o. male  with past medical history of A. Fib, HTN, hypothryoidism, COPD, chronic diastolic CHF admitted on 6/38/1771 with a fall. This is patient's third admission in the last 6 months. He had a hospital admission in May for fecal impaction, fall with compression fracture, acute on chronic kidney failure, and most recently admission August 13-17 for shortness of breath and rectal bleeding. During that admission he was discharged to [facility] home for rehab. He returned home- was home for 5 days and returned to hospital after a fall and workup reveals respiratory distress, ?aspiration, ?COPD exacerbation, ?CHF- family reports he was not doing well upon his return home, had significant decline during his short stay at home. Palliative medicine consulted for [place].   Clinical Assessment and Goals of Care:  Patient in bed- staring at ceiling, reaching. Pulling at covers. Family states he has been talking to "people who aren't there". He is visibly short of breath, using accessory muscles, RR>30. Family reports he is eating very little amount of meals- chart review notes snacks and some breakfast eaten yesterday- lunch at bedside is untouched.   I have reviewed medical records including EPIC notes, labs and imaging, received report from patient's RN- Matt, assessed the patient and then met at the bedside along with patient's three children  to discuss diagnosis prognosis, GOC, EOL wishes, disposition and options.   I introduced Palliative Medicine as specialized medical care for people living with serious illness. It focuses on providing relief  from the symptoms and stress of a serious illness. The goal is to improve quality of life for both the patient and the family.  We discussed a brief life review of the patient. 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   As far as functional status- since he has returned home from [facility] he has spent most of his time in bed- his daughter tells me he has been sleeping most of the days. She has tried to make him get out of bed for meals, but he has preferred to stay in bed. He has had multiple falls and is not able to walk. He requires full assistance will ADL's.     We discussed their current illness and what it means in the larger context of their on-going co-morbidities.  Natural disease trajectory and expectations at EOL were discussed. Family notes that patient is at advanced age. They state they have discussed EOL issues with their Dad and he has said he would not want to be resuscitated and they request a DNR. They note that his siblings all "declined and died quickly" They express they feel their Dad is dying and their goal is for him to die without suffering.   The difference  between aggressive medical intervention and comfort care was considered in light of the patient's goals of care.   Advanced directives, concepts specific to code status, artifical feeding and hydration, and rehospitalization were considered and discussed.  Hospice and Palliative Care services outpatient were explained and offered.  Questions and concerns were addressed.    Primary Decision Maker NEXT OF KIN Daughter- Jordan [name], and siblings    SUMMARY OF RECOMMENDATIONS -Patient appears to be dying and having terminal delirium -GOC- comfort -Referral for residential Hospice- family requests [facility] as this is most convenient for them -Comfort medications as ordered -D/C other lifeprolonging medications not intended for comfort    Code Status/Advance Care  Planning:  DNR    Symptom Management:   Morphine concentrate '5mg'$  SL q1hr prn  Palliative Prophylaxis:   Aspiration and Delirium Protocol  Additional Recommendations (Limitations, Scope, Preferences):  Avoid Hospitalization, Full Comfort Care and Minimize Medications  Psycho-social/Spiritual:  Desire for further Chaplaincy support:no Prognosis:    < 2 weeks  Discharge Planning: Hospice facility  Primary Diagnoses: Present on Admission: . Acute kidney injury superimposed on chronic kidney disease ([place]) . Acute respiratory failure with hypoxia ([place]) . Chronic diastolic heart failure ([place]) . Hypertension . Paroxysmal atrial fibrillation (HCC) . Hypokalemia . Hypomagnesemia . Hypocalcemia . AKI (acute kidney injury) ([place])   I have reviewed the medical record, interviewed the patient and family, and examined the patient. The following aspects are pertinent.  Past Medical History:  Diagnosis Date  . Acute blood loss anemia   . CHF (congestive heart failure) ([place])   . Chronic diastolic heart failure ([place])   . CKD (chronic kidney disease) stage 3, GFR 30-59 ml/min   . Constipation   . COPD (chronic obstructive pulmonary disease) ([place])   . Dyspnea   . Ectropion   . Family history of adverse reaction to anesthesia    " daughter has difficulty waking "  . GERD (gastroesophageal reflux disease)   . Hypertension   . Intertrochanteric fracture of left hip ([place])   . Leukocytosis   . Paroxysmal atrial fibrillation (HCC)   . Physical deconditioning   . PVC (premature ventricular contraction)   . Thyroid disease   . Ventricular bigeminy    Social History   Social History  . Marital status: Widowed    Spouse name: N/A  . Number of children: N/A  . Years of education: N/A   Social History Main Topics  . Smoking status: Former Research scientist (life sciences)  . Smokeless tobacco: Never Used     Comment: quit smoking in 1990  . Alcohol use No  . Drug use: No  . Sexual activity: No    Other Topics Concern  . None   Social History Narrative  . None   Family History  Problem Relation Age of Onset  . Hypertension Father    Scheduled Meds: . latanoprost  1 drop Both Eyes QHS  . senna-docusate  1 tablet Oral Daily   Continuous Infusions: PRN Meds:.acetaminophen **OR** acetaminophen, haloperidol **OR** haloperidol **OR** haloperidol lactate, LORazepam **OR** LORazepam **OR** LORazepam, morphine CONCENTRATE **OR** morphine CONCENTRATE, ondansetron **OR** ondansetron (ZOFRAN) IV, traZODone Medications Prior to Admission:  Prior to Admission medications   Medication Sig Start Date End Date Taking? Authorizing Provider  acetaminophen (TYLENOL) 650 MG CR tablet Take 650 mg by mouth 2 (two) times daily. arthritis   Yes [provider]  albuterol (PROVENTIL HFA;VENTOLIN HFA) 108 (90 Base) MCG/ACT inhaler Inhale 2 puffs into the lungs every 6 (six) hours as  needed for wheezing or shortness of breath.   Yes [provider]  amiodarone (PACERONE) 200 MG tablet Take 1 tablet (200 mg total) by mouth daily. 11/09/16  Yes Jerline Pain, MD  furosemide (LASIX) 40 MG tablet Take 40 mg by mouth See admin instructions. Take on Monday, Wednesday and Friday   Yes [provider]  ipratropium-albuterol (DUONEB) 0.5-2.5 (3) MG/3ML SOLN Take 3 mLs by nebulization every 8 (eight) hours. 10/27/16  Yes Johnson, Clanford L, MD  latanoprost (XALATAN) 0.005 % ophthalmic solution Place 1 drop into both eyes at bedtime. For glaucoma   Yes [provider]  levothyroxine (SYNTHROID, LEVOTHROID) 50 MCG tablet Take 50 mcg by mouth daily before breakfast.   Yes [provider]  pantoprazole (PROTONIX) 40 MG tablet Take 1 tablet (40 mg total) by mouth daily. 05/11/15  Yes Ghimire, Henreitta Leber, MD  senna-docusate (SENOKOT-S) 8.6-50 MG tablet Take 1 tablet by mouth daily. 10/27/16  Yes Johnson, Clanford L, MD  warfarin (COUMADIN) 2 MG tablet Take 1 tablet (2 mg total)  by mouth daily at 6 PM. Patient taking differently: Take 2.5 mg by mouth See admin instructions. Takes 2.5 mg on 'Sunday, Takes 5 mg on Friday and Saturday. She does not remember the what he takes on the other days she as it written down at home. The nurse will come back Thursday 11/30/2016 10/29/16  Yes Johnson, Clanford L, MD   Allergies  Allergen Reactions  . Diltiazem Hcl Er Beads Other (See Comments)    Tachycardia  . Metoprolol Succinate Er [Metoprolol Tartrate] Other (See Comments)    hallucinations  . Tramadol Hcl     Weak he could not stand-up  . Codeine     hallucination   Review of Systems  Unable to perform ROS: Mental status change    Physical Exam  Constitutional: He appears well-developed. He appears distressed.  Cardiovascular:  irregular  Pulmonary/Chest: He is in respiratory distress. He has wheezes.  Abdominal: Soft.  Neurological:  Confused, not oriented  Skin: Skin is warm. There is pallor.  Nursing note and vitals reviewed.   Vital Signs: BP (!) 166/59   Pulse 82   Temp 98 F (36.7 C) (Oral)   Resp 20   Ht 5\' 8" (1.727 m)   Wt 83.5 kg (184 lb)   SpO2 99%   BMI 27.98 kg/m  Pain Assessment: No/denies pain   Pain Score: 0-No pain   SpO2: SpO2: 99 % O2 Device:SpO2: 99 % O2 Flow Rate: .O2 Flow Rate (L/min): 1 L/min  IO: Intake/output summary:  Intake/Output Summary (Last 24 hours) at 11/30/16 1624 Last data filed at 11/30/16 1100  Gross per 24 hour  Intake              480 ml  Output              175 ml  Net              30'$ 5 ml    LBM: Last BM Date: 11/30/16 Baseline Weight: Weight: 83 kg (183 lb) Most recent weight: Weight: 83.5 kg (184 lb)     Palliative Assessment/Data: PPS: 20%     Thank you for this consult. Palliative medicine will continue to follow and assist as needed.   Time In: 1430 Time Out: 1600 Time Total: 90 minutes Greater than 50%  of this time was spent counseling and coordinating care related to the above  assessment and plan.  Signed by: Mariana Kaufman,  AGNP-C Palliative Medicine    Please contact Palliative Medicine Team phone at [phone] for questions and concerns.  For individual provider: See Shea Evans

## 2016-12-01 DIAGNOSIS — Z515 Encounter for palliative care: Secondary | ICD-10-CM

## 2016-12-01 MED ORDER — LORAZEPAM 0.5 MG PO TABS: 0.2500 mg | ORAL_TABLET | ORAL | Status: DC | PRN

## 2016-12-01 MED ORDER — LORAZEPAM 2 MG/ML IJ SOLN: 0.2500 mg | INTRAMUSCULAR | Status: DC | PRN

## 2016-12-01 MED ORDER — LORAZEPAM 2 MG/ML PO CONC: 0.2500 mg | ORAL | Status: DC | PRN

## 2016-12-01 MED ORDER — MORPHINE SULFATE (CONCENTRATE) 10 MG/0.5ML PO SOLN: 5.0000 mg | ORAL | Status: DC | PRN

## 2016-12-01 NOTE — Progress Notes (Addendum)
RT called by RN to assess and suction patient. Patient is DNR and comfort care. RT used 14 french catheter to suction patients mouth and airway. RT obtained thick white sputum. Patient is in no distress, no apparent complications, vitals are stable, RT will continue to monitor.

## 2016-12-01 NOTE — Progress Notes (Signed)
Daily Progress Note   Patient Name: Jordan [name]       Date: 12/01/2016 DOB: 1918/03/15  Age: 83 y.o. MRN#: [id number] Attending Physician: Calvert Cantor, MD Primary Care Physician: Kaleen Mask, MD Admit Date: 11/26/2016  Reason for Consultation/Follow-up: Establishing goals of care, Non pain symptom management and Terminal Care  Subjective: Patient in bed, appears comfortable this morning. Audible secretions. Per daughter, Massie Bougie- he had a difficult night with significant agitatio and shortness of breath- gripping and shaking the bed uncontrollably, significant SOB. He was given a one time IV dose of  morphine around midnight, and then received  lorazepam IV at 5am this morning. They are hopeful that he will arouse after medication wears off.  Review of Systems  All other systems reviewed and are negative.   Length of Stay: 4  Current Medications: Scheduled Meds:  . latanoprost  1 drop Both Eyes QHS  . senna-docusate  1 tablet Oral Daily    Continuous Infusions:   PRN Meds: acetaminophen **OR** acetaminophen, haloperidol **OR** haloperidol **OR** haloperidol lactate, LORazepam **OR** LORazepam **OR** LORazepam, morphine CONCENTRATE **OR** morphine CONCENTRATE, ondansetron **OR** ondansetron (ZOFRAN) IV, traZODone  Physical Exam  Constitutional: No distress.  Cardiovascular: Intact distal pulses.   Tachycardic  Pulmonary/Chest: He has wheezes. He has rales.  Audible secretions  Abdominal: Soft. Bowel sounds are normal.  Neurological:  Does not open eyes to my voice or touch  Skin: Skin is warm and dry.  Nursing note and vitals reviewed.           Vital Signs: BP (!) 183/79 (BP Location: Right Arm)   Pulse (!) 101   Temp 97.6 F (36.4 C) (Oral)   Resp  (!) 22   Ht  (1.727 m)   Wt 82.1 kg (181 lb)   SpO2 95%   BMI 27.52 kg/m  SpO2: SpO2: 95 % O2 Device: O2 Device: Nasal Cannula O2 Flow Rate: O2 Flow Rate (L/min): 2 L/min  Intake/output summary:  Intake/Output Summary (Last 24 hours) at 12/01/16 1025 Last data filed at 11/30/16 1845  Gross per 24 hour  Intake              360 ml  Output                0 ml  Net  360 ml   LBM: Last BM Date: 11/30/16 Baseline Weight: Weight: 83 kg (183 lb) Most recent weight: Weight: 82.1 kg (181 lb)       Palliative Assessment/Data: PPS: 10%      Patient Active Problem List   Diagnosis Date Noted  . Fall at home, initial encounter 11/27/2016  . Acute respiratory failure with hypoxia (HCC) 11/27/2016  . Hypokalemia 11/27/2016  . Hypomagnesemia 11/27/2016  . Hypocalcemia 11/27/2016  . AKI (acute kidney injury) (HCC) 11/27/2016  . CKD (chronic kidney disease) stage 4, GFR 15-29 ml/min (HCC) 10/24/2016  . Respiratory distress 10/23/2016  . BRBPR (bright red blood per rectum) 10/23/2016  . Paroxysmal atrial fibrillation (HCC)   . Closed compression fracture of L2 lumbar vertebra (HCC)/ ACUTE/SUBACUTE 07/18/2016  . Abdominal distention   . Low back pain   . Acute kidney injury superimposed on chronic kidney disease (HCC) 07/16/2016  . Fecal impaction (HCC) 07/16/2016  . GERD (gastroesophageal reflux disease) 07/16/2016  . Femur fracture, left (HCC) 05/07/2015  . Hip fracture (HCC) 05/06/2015  . Faintness 09/16/2013  . 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 Syncope 07/10/2013  . Pneumonia 05/23/2013  . Dysphagia 05/09/2013  . Acute blood loss anemia 05/08/2013  . Food impaction of esophagus 04/26/2013  . Closed right hip fracture (HCC) 04/24/2013  . CAP (community acquired pneumonia) 04/24/2013  . Leukocytosis 04/24/2013  . Encounter for therapeutic drug monitoring 04/18/2013  . COPD exacerbation (HCC) 01/01/2013  . Chronic anticoagulation 01/01/2013  . Chronic diastolic  heart failure (HCC) 40/98/1191  . Diarrhea 01/01/2013  . Atrial fibrillation with RVR (HCC) 12/18/2012  . Bradycardia 12/16/2012  . Hypertension 12/16/2012  . Hypothyroidism 12/16/2012  . Acute kidney failure (HCC) 12/16/2012    Palliative Care Assessment & Plan   Patient Profile: 83 y.o. male  with past medical history of A. Fib, HTN, hypothryoidism, COPD, chronic diastolic CHF admitted on 11/26/2016 with a fall. This is patient's third admission in the last 6 months. He had a hospital admission in May for fecal impaction, fall with compression fracture, acute on chronic kidney failure, and most recently admission August 13-17 for shortness of breath and rectal bleeding. During that admission he was discharged to [facility] for rehab. He returned home- was home for 5 days and returned to hospital after a fall and workup reveals respiratory distress, ?aspiration, ?COPD exacerbation, ?CHF- family reports he was not doing well upon his return home, had significant decline during his short stay at home. Palliative medicine consulted for GOC. Family chose to stop aggressive medical care and pursue full comfort care and residential hospice placement.  Assessment/Recommendations/Plan   Full comfort care  Try to balance sedating medications and symptom management- - use sublingual medications first rather than IV- sublingual can be absorbed under the tongue and does not need to be swallowed.  Awaiting residential hospice placement  Continue  SL morphine concentrate q1hr prn SOB  Lorazepam solution .  sublingual for SOB unrelieved with morphine  Haldol .  sublingual q4hr prn agitation or delirium   Goals of Care and Additional Recommendations:  Limitations on Scope of Treatment: Full Comfort Care  Code Status:  DNR  Prognosis:   < 2 weeks  Discharge Planning:  Hospice facility  Care plan was discussed with patient's daughter- Massie Bougie.  Thank you for allowing  the Palliative Medicine Team to assist in the care of this patient.   Time In: 0950 Time Out: 1030 Total Time 40 minutes Prolonged Time Billed No  Greater than 50%  of this time was spent counseling and coordinating care related to the above assessment and plan.  Mariana Kaufman, AGNP-C Palliative Medicine   Please contact Palliative Medicine Team phone at [phone] for questions and concerns.

## 2016-12-01 NOTE — Care Management Important Message (Signed)
Important Message  Patient Details  Name: Jordan [name] MRN: [id number] Date of Birth: 1918/09/11   Medicare Important Message Given:  Yes    Haivyn Oravec Abena 12/01/2016, 10:04 AM

## 2016-12-01 NOTE — Discharge Summary (Signed)
Physician Discharge Summary  Jordan [name] ZOX:[id number] DOB: November 27, 1918 DOA: 11/26/2016  PCP: Jordan Mask, MD  Admit date: 11/26/2016 Discharge date: 12/01/2016  Admitted From: home Disposition:  home   Discharge Condition:  stable   CODE STATUS:  DNR   Diet recommendation:  Diet as tolerated  Discharge Diagnoses:  Active Problems:   Hypertension   Chronic diastolic heart failure (HCC)   Acute kidney injury superimposed on chronic kidney disease (HCC)   Paroxysmal atrial fibrillation (HCC)   Fall at home, initial encounter   Acute respiratory failure with hypoxia (HCC)   Hypokalemia   Hypomagnesemia   Hypocalcemia   AKI (acute kidney injury) [facility])   Terminal care   Palliative care by specialist    Subjective: Asleep   Brief Summary: Jordan [name] is a 83 y.o. male with medical history significant of A. fib, HTN, hypothyroidism, COPD, chronic diastolic CHF who presents after a fall.  Patient was admitted into the hospital from 813-8/17, and then [name] where he stayed 3.5 weeks and was discharged home 5 days ago. Family notes that just before he left the rehabilitation facility was noted have drops in his O2 saturations for which he was started on oxygen. Patient never required oxygen previously. There are also issues with his INR fluctuating up to 8 and adjustments had to be made to his Coumadin dosing. Since being home he has progressively declined. Previously, patient could ambulate with use of a rolling walker.   Hospital Course:  Frequent falls/ generalized weakness - he has had significant functional decline- per Pt notes, he was unsteady on his feet and became significantly hypoxic with a small amount of activity. -  his family has decided to pursue comfort care- he will be transitioned to hospice home today  Dysphagia- on thickened liquid diet COPD CKD3 PAF HTN Hypothyroidism dCHF  Discharge  Instructions   Allergies as of 12/01/2016      Reactions   Diltiazem Hcl Er Beads Other (See Comments)   Tachycardia   Metoprolol Succinate Er [metoprolol Tartrate] Other (See Comments)   hallucinations   Tramadol Hcl    Weak he could not stand-up   Codeine    hallucination      Medication List    STOP taking these medications   amiodarone 200 MG tablet Commonly known as:  PACERONE   furosemide 40 MG tablet Commonly known as:  LASIX   levothyroxine 50 MCG tablet Commonly known as:  SYNTHROID, LEVOTHROID   pantoprazole 40 MG tablet Commonly known as:  PROTONIX   warfarin 2 MG tablet Commonly known as:  COUMADIN     TAKE these medications   acetaminophen 650 MG CR tablet Commonly known as:  TYLENOL Take 650 mg by mouth 2 (two) times daily. arthritis   albuterol 108 (90 Base) MCG/ACT inhaler Commonly known as:  PROVENTIL HFA;VENTOLIN HFA Inhale 2 puffs into the lungs every 6 (six) hours as needed for wheezing or shortness of breath.   ipratropium-albuterol 0.5-2.5 (3) MG/3ML Soln Commonly known as:  DUONEB Take 3 mLs by nebulization every 8 (eight) hours.   latanoprost 0.005 % ophthalmic solution Commonly known as:  XALATAN Place 1 drop into both eyes at bedtime. For glaucoma   senna-docusate 8.6-50 MG tablet Commonly known as:  Senokot-S Take 1 tablet by mouth daily.       Allergies  Allergen Reactions  . Diltiazem Hcl Er Beads Other (See Comments)    Tachycardia  . Metoprolol  Succinate Er [Metoprolol Tartrate] Other (See Comments)    hallucinations  . Tramadol Hcl     Weak he could not stand-up  . Codeine     hallucination     Procedures/Studies:  Dg Abd 1 View  Result Date: 11/27/2016 CLINICAL DATA:  Abdominal pain EXAM: ABDOMEN - 1 VIEW COMPARISON:  None. FINDINGS: Oral contrast material is seen within the stomach and small bowel. Small hiatal hernia is noted. There is no bowel dilatation to suggest obstruction. There is no evidence of  pneumoperitoneum, portal venous gas or pneumatosis. There are no pathologic calcifications along the expected course of the ureters. There bilateral hip arthroplasties. IMPRESSION: No bowel obstruction. Electronically Signed   By: [name]   On: 11/27/2016 14:10   Dg Chest Port 1 View  Result Date: 11/28/2016 CLINICAL DATA:  Wheezing, shortness of breath and cough. EXAM: PORTABLE CHEST 1 VIEW COMPARISON:  Single-view of the chest 11/26/2016, 10/23/2016 and 05/10/2015. CT chest 06/13/2007. FINDINGS: The lungs are emphysematous with basilar scarring, unchanged. No consolidative process, pneumothorax or effusion. There is cardiomegaly. IMPRESSION: Emphysema without acute disease. Cardiomegaly. Electronically Signed   By: [name] M.D.   On: 11/28/2016 08:34   Dg Chest Port 1 View  Result Date: 11/26/2016 CLINICAL DATA:  Shortness of breath EXAM: PORTABLE CHEST 1 VIEW COMPARISON:  10/23/2016 FINDINGS: Normal heart size. No pleural effusion or edema. No airspace opacities identified. Lungs are hyperinflated and there are chronic coarsened interstitial markings compatible with emphysema identified. IMPRESSION: 1. No acute cardiopulmonary abnormalities. 2.  Emphysema (ICD10-J43.9). Electronically Signed   By: [name] M.D.   On: 11/26/2016 21:02   Dg Swallowing Func-speech Pathology  Result Date: 11/27/2016 Objective Swallowing Evaluation: Type of Study: MBS-Modified Barium Swallow Study Patient Details Name: Jordan [name] MRN: [id number] Date of Birth: April 28, 1918 Today's Date: 11/27/2016 Time: SLP Start Time (ACUTE ONLY): 1336-SLP Stop Time (ACUTE ONLY): 1351 SLP Time Calculation (min) (ACUTE ONLY): 15 min Past Medical History: Past Medical History: Diagnosis Date . Acute blood loss anemia  . CHF (congestive heart failure) (HCC)  . Chronic diastolic heart failure (HCC)  . CKD (chronic kidney disease) stage 3, GFR 30-59 ml/min  . Constipation  . COPD (chronic obstructive pulmonary disease) (HCC)   . Dyspnea  . Ectropion  . Family history of adverse reaction to anesthesia   " daughter has difficulty waking " . GERD (gastroesophageal reflux disease)  . Hypertension  . Intertrochanteric fracture of left hip (HCC)  . Leukocytosis  . Paroxysmal atrial fibrillation (HCC)  . Physical deconditioning  . PVC (premature ventricular contraction)  . Thyroid disease  . Ventricular bigeminy  Past Surgical History: Past Surgical History: Procedure Laterality Date . CHOLECYSTECTOMY   . ESOPHAGOGASTRODUODENOSCOPY N/A 04/26/2013  Procedure: ESOPHAGOGASTRODUODENOSCOPY (EGD);  Surgeon: Florencia Reasons, MD;  Location: [facility] ENDOSCOPY;  Service: Endoscopy;  Laterality: N/A; . ESOPHAGOGASTRODUODENOSCOPY (EGD) WITH ESOPHAGEAL DILATION N/A 04/26/2013  Procedure: ESOPHAGOGASTRODUODENOSCOPY (EGD) WITH ESOPHAGEAL DILATION;  Surgeon: Florencia Reasons, MD;  Location: [facility] ENDOSCOPY;  Service: Endoscopy;  Laterality: N/A; . HIP ARTHROPLASTY Right 04/25/2013  Procedure: ARTHROPLASTY BIPOLAR HIP;  Surgeon: Nestor Lewandowsky, MD;  Location: [facility] OR;  Service: Orthopedics;  Laterality: Right; . HIP ARTHROPLASTY Left 05/07/2015  Procedure: LEFT HEMI-HIP ARTHROPLASTY CEMENTED;  Surgeon: Gean Birchwood, MD;  Location: [facility] OR;  Service: Orthopedics;  Laterality: Left; . SPLENECTOMY, PARTIAL   HPI: Pt is a 83 y.o.maleadmitted s/p fall, found to have acute kidney injury and acute respiratory failure with need for supplemental  O2 over the past two weeks per family. Pt had a recent hospital stay 8/13-8/17 followed by 3.5 weeks at SNF, where family says he had a swallow study done (report unavailable) that recommended thickened liquids. PMH includes a fib, HTN, hypthyroidism, COPD, chronic diastolic CHF, GERD, esophageal stricture s/p previous dilations, dyspnea Subjective: pt alert, says he gets coughing easily on thin liquids Assessment / Plan / Recommendation CHL IP CLINICAL IMPRESSIONS 11/27/2016 Clinical Impression Pt has a mild oropharyngeal dysphagia with a  suspected esophageal component as well, particularly given his h/o esophageal issues. He has decreased bolus cohesion orally with premature spillage with liquid consistencies tested. Liquids reach even to his pyriform sinuses before swallow initiation, which is not necessarily atypical given his advanced age, but I think when coupled with his decreased respriatory status, he does not achieve adequate airway closure in time to protect his airway. Thin liquids were silently aspirated before the swallow, and silent penetration was observed with straw sips of nectar thick liquids. He seemed to have somewhat improved airway protection when using a chin tuck with thin liquids by the cup, but his hearing loss impacted his ability to consistently follow commands for use of strategies. Also of note, pt was observed to have coughing during this study only after the barium tablet appeared to become lodged in the esophagus (MD not present to confirm), and was not actually associated with airway compromise. Recommend Dys 3 diet for energy conservation and to facilitate esophageal clearance. Would start with nectar thick liquids by cup only, but would try therapeutic trials of thin water with SLP only to work on implementation of a chin tuck. SLP Visit Diagnosis Dysphagia, oropharyngeal phase (R13.12) Attention and concentration deficit following -- Frontal lobe and executive function deficit following -- Impact on safety and function Mild aspiration risk;Moderate aspiration risk   CHL IP TREATMENT RECOMMENDATION 11/27/2016 Treatment Recommendations Therapy as outlined in treatment plan below   Prognosis 11/27/2016 Prognosis for Safe Diet Advancement Good Barriers to Reach Goals -- Barriers/Prognosis Comment -- CHL IP DIET RECOMMENDATION 11/27/2016 SLP Diet Recommendations Dysphagia 3 (Mech soft) solids;Nectar thick liquid Liquid Administration via Cup;No straw Medication Administration Whole meds with puree Compensations Minimize  environmental distractions;Slow rate;Small sips/bites;Follow solids with liquid;Other (Comment) Postural Changes Remain semi-upright after after feeds/meals (Comment);Seated upright at 90 degrees   CHL IP OTHER RECOMMENDATIONS 11/27/2016 Recommended Consults -- Oral Care Recommendations Oral care BID Other Recommendations --   CHL IP FOLLOW UP RECOMMENDATIONS 11/27/2016 Follow up Recommendations (No Data)   CHL IP FREQUENCY AND DURATION 11/27/2016 Speech Therapy Frequency (ACUTE ONLY) min 2x/week Treatment Duration 2 weeks      CHL IP ORAL PHASE 11/27/2016 Oral Phase Impaired Oral - Pudding Teaspoon -- Oral - Pudding Cup -- Oral - Honey Teaspoon -- Oral - Honey Cup -- Oral - Nectar Teaspoon -- Oral - Nectar Cup Decreased bolus cohesion Oral - Nectar Straw Decreased bolus cohesion;Premature spillage Oral - Thin Teaspoon -- Oral - Thin Cup Decreased bolus cohesion;Premature spillage Oral - Thin Straw Decreased bolus cohesion;Premature spillage Oral - Puree WFL Oral - Mech Soft WFL Oral - Regular -- Oral - Multi-Consistency -- Oral - Pill WFL Oral Phase - Comment --  CHL IP PHARYNGEAL PHASE 11/27/2016 Pharyngeal Phase Impaired Pharyngeal- Pudding Teaspoon -- Pharyngeal -- Pharyngeal- Pudding Cup -- Pharyngeal -- Pharyngeal- Honey Teaspoon -- Pharyngeal -- Pharyngeal- Honey Cup -- Pharyngeal -- Pharyngeal- Nectar Teaspoon -- Pharyngeal -- Pharyngeal- Nectar Cup Reduced airway/laryngeal closure Pharyngeal -- Pharyngeal- Nectar Straw Reduced airway/laryngeal closure;Penetration/Aspiration  before swallow Pharyngeal Material enters airway, remains ABOVE vocal cords and not ejected out Pharyngeal- Thin Teaspoon -- Pharyngeal -- Pharyngeal- Thin Cup Reduced airway/laryngeal closure;Penetration/Aspiration before swallow Pharyngeal Material enters airway, passes BELOW cords without attempt by patient to eject out (silent aspiration) Pharyngeal- Thin Straw Penetration/Aspiration before swallow;Reduced airway/laryngeal  closure;Compensatory strategies attempted (with notebox) Pharyngeal Material enters airway, remains ABOVE vocal cords and not ejected out Pharyngeal- Puree WFL Pharyngeal -- Pharyngeal- Mechanical Soft WFL Pharyngeal -- Pharyngeal- Regular -- Pharyngeal -- Pharyngeal- Multi-consistency -- Pharyngeal -- Pharyngeal- Pill Pharyngeal residue - valleculae Pharyngeal -- Pharyngeal Comment --  CHL IP CERVICAL ESOPHAGEAL PHASE 11/27/2016 Cervical Esophageal Phase WFL Pudding Teaspoon -- Pudding Cup -- Honey Teaspoon -- Honey Cup -- Nectar Teaspoon -- Nectar Cup -- Nectar Straw -- Thin Teaspoon -- Thin Cup -- Thin Straw -- Puree -- Mechanical Soft -- Regular -- Multi-consistency -- Pill -- Cervical Esophageal Comment -- CHL IP GO 11/27/2016 Functional Assessment Tool Used skilled clinical judgment Functional Limitations Swallowing Swallow Current Status (Z6109) CK Swallow Goal Status (U0454) CJ Swallow Discharge Status (U9811) (None) Motor Speech Current Status (B1478) (None) Motor Speech Goal Status (G9562) (None) Motor Speech Goal Status (Z3086) (None) Spoken Language Comprehension Current Status (V7846) (None) Spoken Language Comprehension Goal Status (N6295) (None) Spoken Language Comprehension Discharge Status (M8413) (None) Spoken Language Expression Current Status (K4401) (None) Spoken Language Expression Goal Status (U2725) (None) Spoken Language Expression Discharge Status [phone]) (None) Attention Current Status (I3474) (None) Attention Goal Status (Q5956) (None) Attention Discharge Status (L8756) (None) Memory Current Status (E3329) (None) Memory Goal Status (J1884) (None) Memory Discharge Status (Z6606) (None) Voice Current Status (T0160) (None) Voice Goal Status (F0932) (None) Voice Discharge Status (T5573) (None) Other Speech-Language Pathology Functional Limitation Current Status (U2025) (None) Other Speech-Language Pathology Functional Limitation Goal Status (K2706) (None) Other Speech-Language Pathology  Functional Limitation Discharge Status [phone]) (None) Maxcine Ham 11/27/2016, 2:42 PM  Maxcine Ham, M.A. CCC-SLP [phone]                 Discharge Exam: Vitals:   12/01/16 0418 12/01/16 0735  BP: (!) 153/71 (!) 183/79  Pulse: 75 (!) 101  Resp: 18 (!) 22  Temp: 98.2 F (36.8 C) 97.6 F (36.4 C)  SpO2: 93% 95%   Vitals:   11/30/16 1843 11/30/16 2053 12/01/16 0418 12/01/16 0735  BP: (!) 145/57 (!) 142/91 (!) 153/71 (!) 183/79  Pulse: 77 80 75 (!) 101  Resp:  20 18 (!) 22  Temp: 98.4 F (36.9 C) 98.1 F (36.7 C) 98.2 F (36.8 C) 97.6 F (36.4 C)  TempSrc: Oral Axillary Axillary Oral  SpO2: 98% 95% 93% 95%  Weight:  82.1 kg (181 lb)    Height:        General: Pt is alert, awake, not in acute distress Cardiovascular: RRR, S1/S2 +, no rubs, no gallops Respiratory: CTA bilaterally, no wheezing, no rhonchi Abdominal: Soft, NT, ND, bowel sounds + Extremities: no edema, no cyanosis    The results of significant diagnostics from this hospitalization (including imaging, microbiology, ancillary and laboratory) are listed below for reference.     Microbiology: No results found for this or any previous visit (from the past 240 hour(s)).   Labs: BNP (last 3 results)  Recent Labs  10/23/16 1200 11/26/16 1852  BNP 125.1* 37.1   Basic Metabolic Panel:  Recent Labs Lab 11/26/16 1852 11/26/16 1909 11/27/16 0313 11/28/16 0718 11/30/16 0238  NA  --  144 148* 142 143  K  --  3.0*  3.9 3.7 3.8  CL  --  101 106 105 106  CO2  --   --  33* 29 30  GLUCOSE  --  104* 92 224* 117*  BUN  --  41* 35* 30* 36*  CREATININE  --  2.10* 2.10* 1.87* 1.77*  CALCIUM 6.7*  --  7.3* 7.6* 7.8*  MG 1.2*  --  1.2* 2.3  --   PHOS 2.7  --   --   --   --    Liver Function Tests:  Recent Labs Lab 11/27/16 0313  AST 21  ALT 21  ALKPHOS 81  BILITOT 1.3*  PROT 5.7*  ALBUMIN 2.7*   No results for input(s): LIPASE, AMYLASE in the last 168 hours. No results for input(s):  AMMONIA in the last 168 hours. CBC:  Recent Labs Lab 11/26/16 1852 11/26/16 1909 11/27/16 0313 11/28/16 0718 11/30/16 0238  WBC 10.1  --  8.9 10.9* 17.5*  NEUTROABS 8.1*  --   --   --   --   HGB 12.3* 10.9* 12.1* 11.2* 11.7*  HCT 37.3* 32.0* 36.7* 34.4* 35.8*  MCV 99.7  --  100.3* 99.4 100.6*  PLT 223  --  233 224 263   Cardiac Enzymes: No results for input(s): CKTOTAL, CKMB, CKMBINDEX, TROPONINI in the last 168 hours. BNP: Invalid input(s): POCBNP CBG:  Recent Labs Lab 11/29/16 1704 11/29/16 2138 11/30/16 0736 11/30/16 1222 11/30/16 1611  GLUCAP 138* 152* 108* 119* 150*   D-Dimer No results for input(s): DDIMER in the last 72 hours. Hgb A1c No results for input(s): HGBA1C in the last 72 hours. Lipid Profile No results for input(s): CHOL, HDL, LDLCALC, TRIG, CHOLHDL, LDLDIRECT in the last 72 hours. Thyroid function studies No results for input(s): TSH, T4TOTAL, T3FREE, THYROIDAB in the last 72 hours.  Invalid input(s): FREET3 Anemia work up No results for input(s): VITAMINB12, FOLATE, FERRITIN, TIBC, IRON, RETICCTPCT in the last 72 hours. Urinalysis    Component Value Date/Time   COLORURINE YELLOW 11/26/2016 2141   APPEARANCEUR HAZY (A) 11/26/2016 2141   LABSPEC 1.023 11/26/2016 2141   PHURINE 5.0 11/26/2016 2141   GLUCOSEU NEGATIVE 11/26/2016 2141   HGBUR NEGATIVE 11/26/2016 2141   BILIRUBINUR NEGATIVE 11/26/2016 2141   KETONESUR NEGATIVE 11/26/2016 2141   PROTEINUR 100 (A) 11/26/2016 2141   UROBILINOGEN 0.2 07/10/2013 1520   NITRITE NEGATIVE 11/26/2016 2141   LEUKOCYTESUR NEGATIVE 11/26/2016 2141   Sepsis Labs Invalid input(s): PROCALCITONIN,  WBC,  LACTICIDVEN Microbiology No results found for this or any previous visit (from the past 240 hour(s)).   Time coordinating discharge: Over 30 minutes  SIGNED:   Calvert Cantor, MD  [facility] 12/01/2016, 12:08 PM Pager   If 7PM-7AM, please contact night-coverage [email or web address] Password  [email or web address]

## 2016-12-01 NOTE — Progress Notes (Signed)
Spoke with daughters at bedside. They feel he is oversedated and are asking if we can cut back on medications to keep him awake. Have discussed with RN.  Full note to follow. Awaiting bed at hospice home.   Calvert Cantor, MD

## 2016-12-01 NOTE — Progress Notes (Signed)
Patient discharged to [facility] facility, report called and given to RN. Family at bedside. PTAR  Transported patient to awaiting facility.

## 2016-12-01 NOTE — Clinical Social Work Note (Signed)
[name] will discharge to [facility] facility today, transported by ambulance. Discharge clinicals transmitted to facility to [facility] attention and patient's nurse provided with number to call report. CSW visited room and informed [name] that transport had been called.  CSW signing off as patient discharging to hospice facility, however please reconsult if any other SW interventions needed prior to patient leaving hospital.  Genelle Bal, MSW, LCSW Licensed Clinical Social Worker Clinical Social Work Department [employer] [phone]

## 2016-12-01 NOTE — Progress Notes (Signed)
RT was called to patient room to suction patient again. RT suctioned patients mouth out with Meryl Dare. RT tried to suction patient's mouth with 14 french catheter but patient is pushing RT away and closing mouth to try to inhibit suctioning. RT has noted concern to RN that patient is aspirating fluids as there were multiple cartons of juice and nectar on patients bedside table that were open. RN proceeded to move cartons when RT pointed this out. Patient is being transported to a hospice facility today. RT will continue to monitor.

## 2016-12-04 DIAGNOSIS — Z7189 Other specified counseling: Secondary | ICD-10-CM

## 2016-12-11 DEATH — deceased

## 2017-01-02 ENCOUNTER — Telehealth: Payer: Self-pay | Admitting: Cardiology

## 2017-01-02 NOTE — Telephone Encounter (Signed)
[facility] to inquire specifically about what she needs as all Home Health orders are given verbally over the phone & their facility faxes over the order to have it signed by the Physician, therefore, had to leave a message asking her to call back for further information.

## 2017-01-02 NOTE — Telephone Encounter (Signed)
New message     They need you to sign home health order for updated coumadin regimen for date of service 11/23/16

## 2017-01-05 NOTE — Telephone Encounter (Signed)
Pts daughter states pt passed in September.

## 2017-01-09 ENCOUNTER — Ambulatory Visit: Payer: Medicare Other | Admitting: Nurse Practitioner

## 2017-11-13 IMAGING — CR DG PELVIS 1-2V
2 series · 2 of 2 positions shown · non-contrast
Comparison: 05/07/2015

CLINICAL DATA: Fall, pain.

EXAM:
PELVIS - 1-2 VIEW

[w pelvis upright (1 of 2)]
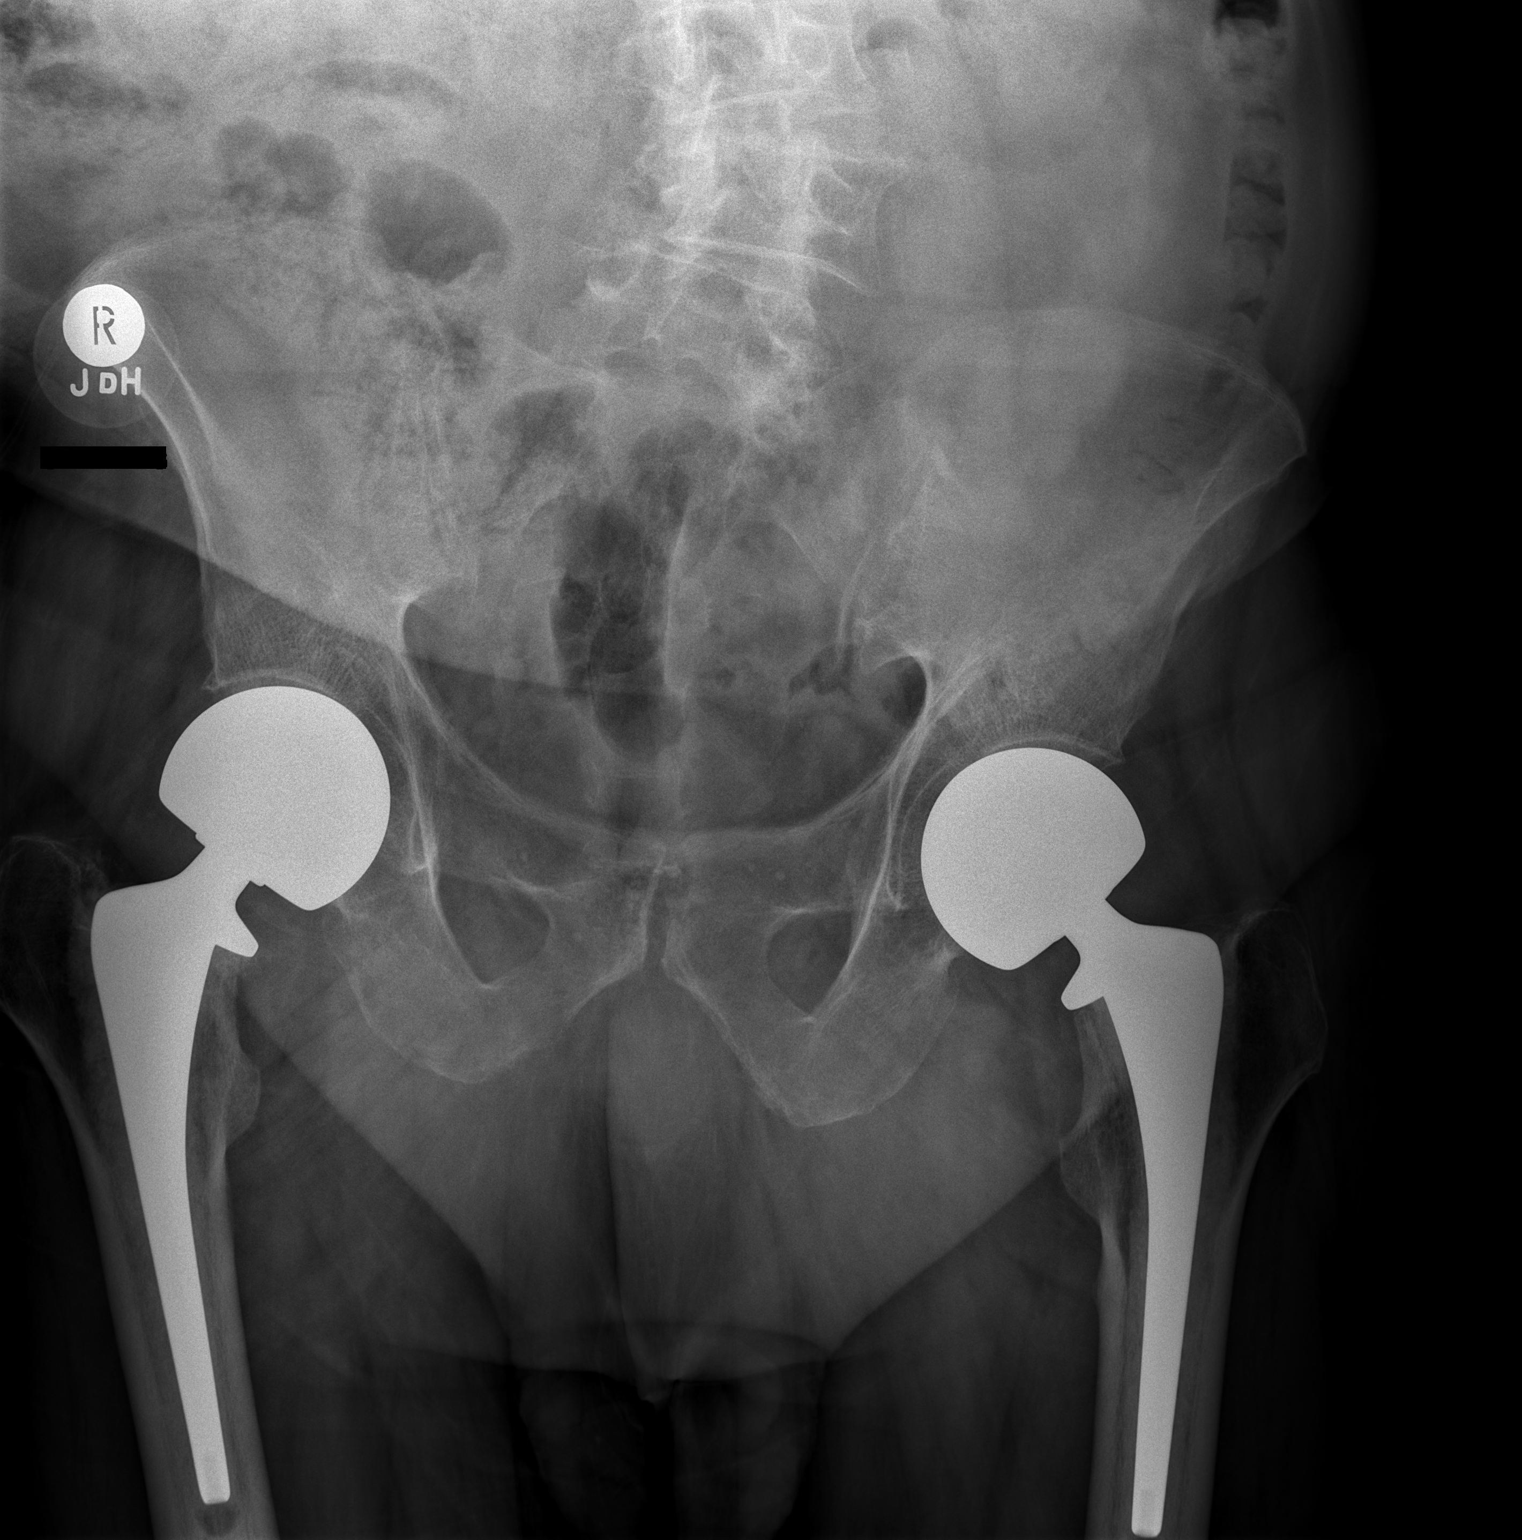

[w pelvis upright (2 of 2)]
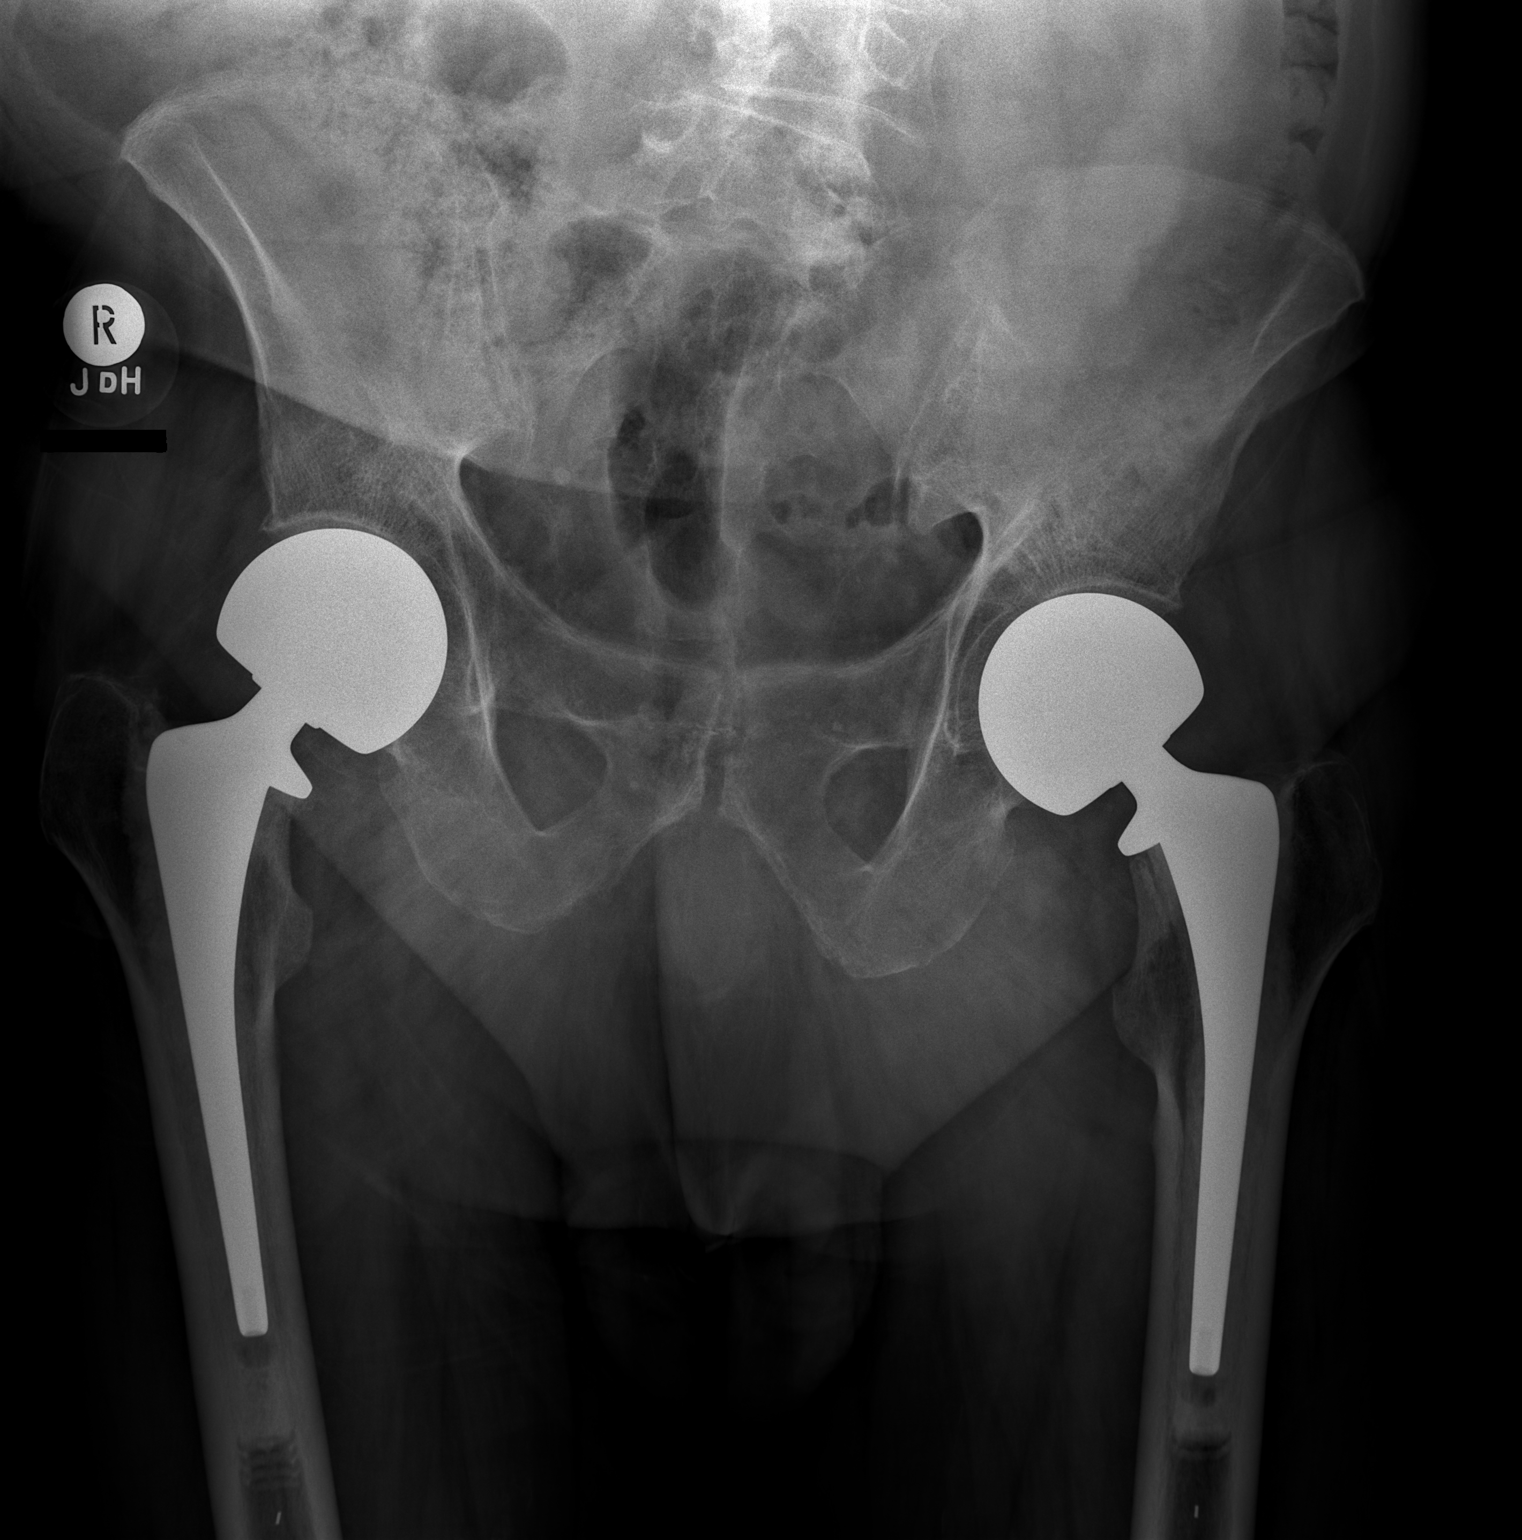

[2 of 2 positions shown; findings below may reference images not displayed]

FINDINGS: Bilateral hip replacements noted. No complicating feature. No acute
bony abnormality. Specifically, no fracture, subluxation, or
dislocation. Soft tissues are intact.
IMPRESSION: No acute bony abnormality.

## 2018-03-02 IMAGING — DX DG CHEST 1V PORT
1 series · 1 of 1 positions shown · non-contrast
Comparison: Portable exam 7577 hours compared to 07/16/2016

CLINICAL DATA: Progressive weakness over last several weeks, today
found by members in a blood-stained chair while eating breakfast,
history COPD, hypertension, GERD, CHF

EXAM:
PORTABLE CHEST 1 VIEW

[chest ap]
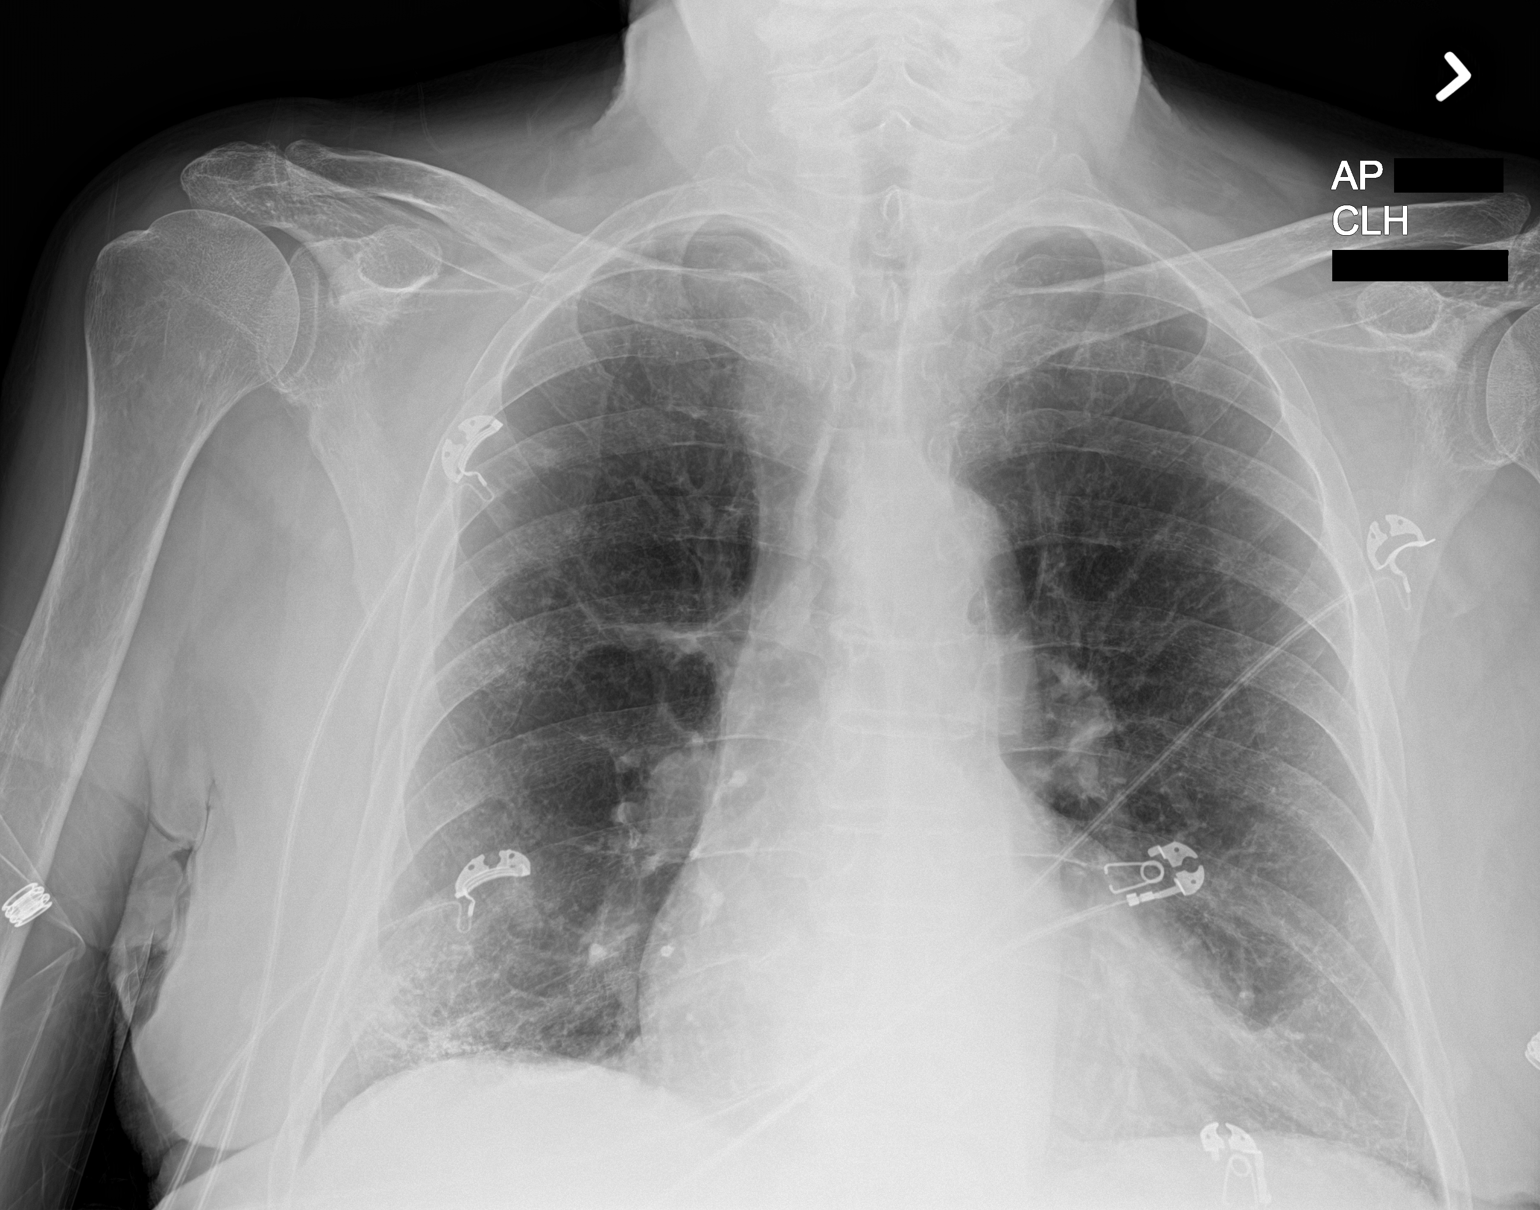

[1 of 1 positions shown; findings below may reference images not displayed]

FINDINGS: Normal heart size, mediastinal contours, and pulmonary vascularity.

Chronic interstitial changes at lung bases greater on RIGHT, stable.

Underlying emphysematous changes.

No acute infiltrate, pleural effusion or pneumothorax.

Bones demineralized.
IMPRESSION: Emphysematous and basilar chronic interstitial changes.

No acute abnormalities.

## 2018-04-06 IMAGING — DX DG ABDOMEN 1V
1 series · 1 of 1 positions shown · non-contrast
Comparison: None.

CLINICAL DATA: Abdominal pain

EXAM:
ABDOMEN - 1 VIEW

[abdomen kub]
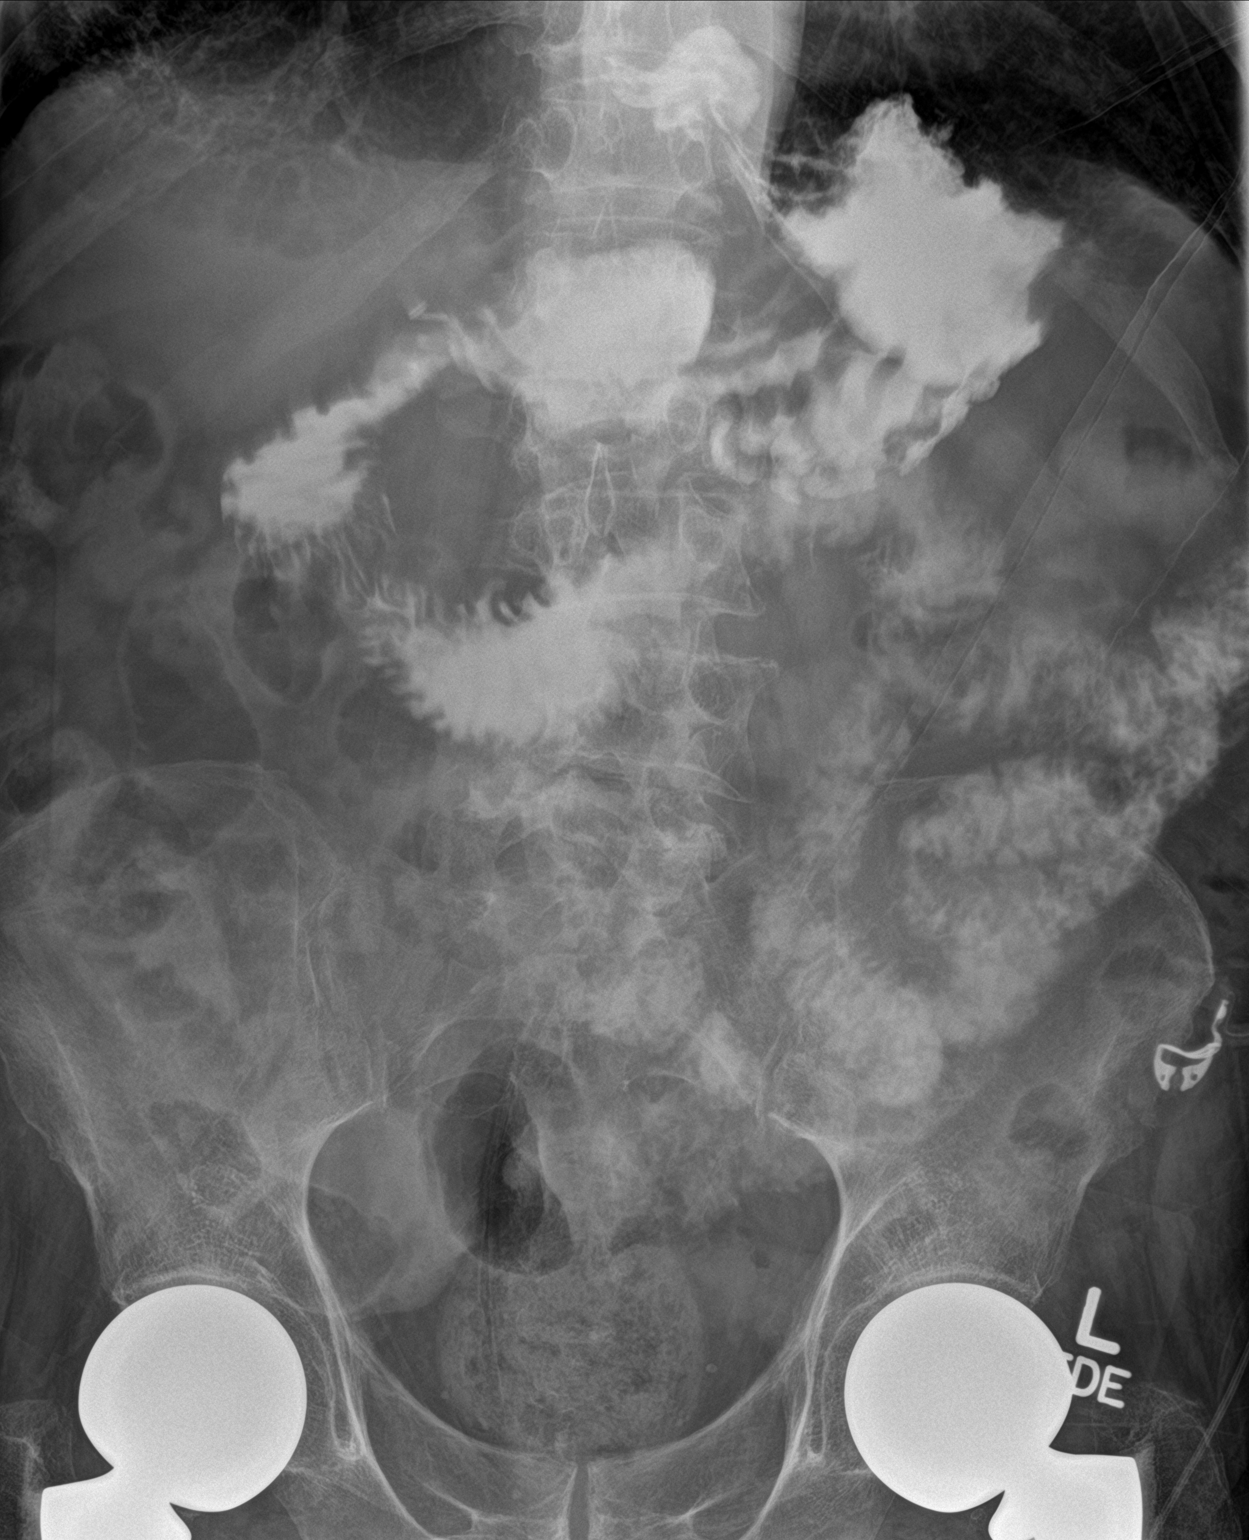

[1 of 1 positions shown; findings below may reference images not displayed]

FINDINGS: Oral contrast material is seen within the stomach and small bowel.
Small hiatal hernia is noted. There is no bowel dilatation to
suggest obstruction. There is no evidence of pneumoperitoneum,
portal venous gas or pneumatosis. There are no pathologic
calcifications along the expected course of the ureters. There
bilateral hip arthroplasties.
IMPRESSION: No bowel obstruction.

## 2018-04-06 IMAGING — RF DG SWALLOWING FUNCTION - NRPT [facility]
1 series · 18 of 24 positions shown · non-contrast
Comparison: none

[Series 1: run · 18 acquisitions, 18 frames shown]
[im 1/18]
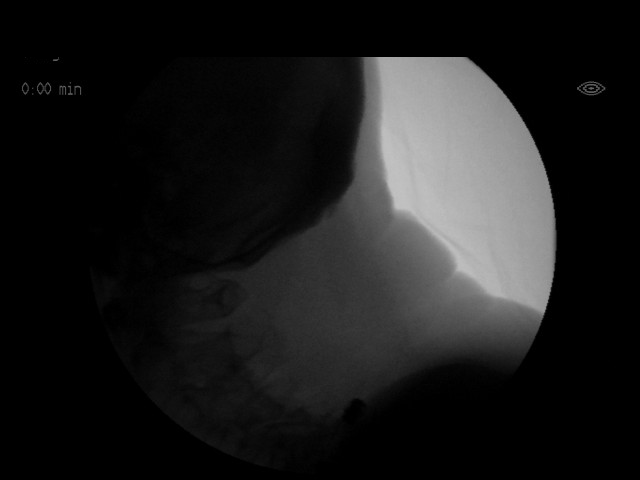
[im 2/18]
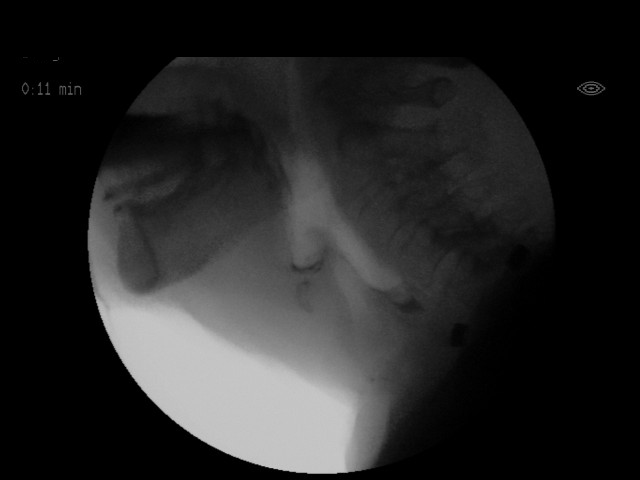
[im 3/18]
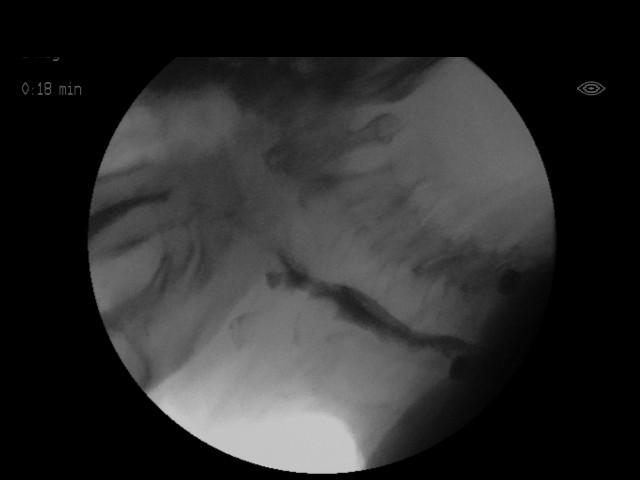
[im 4/18]
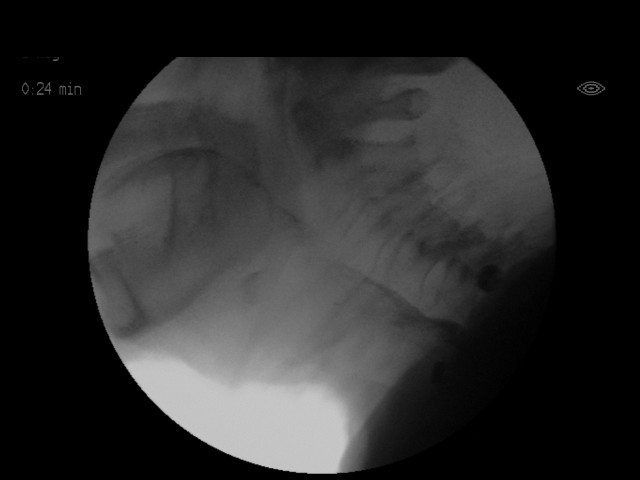
[im 5/18]
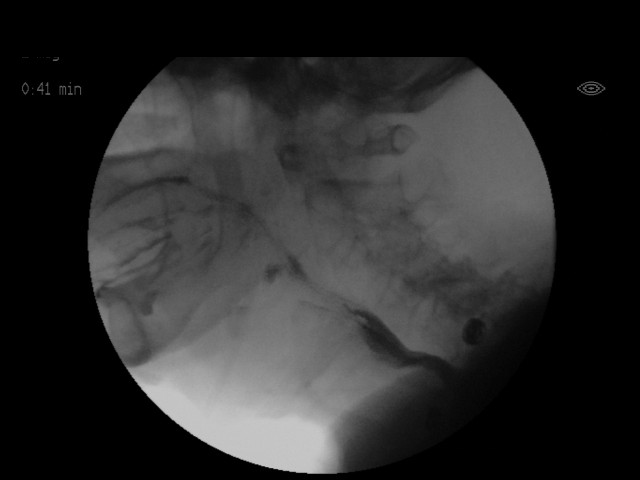
[im 6/18]
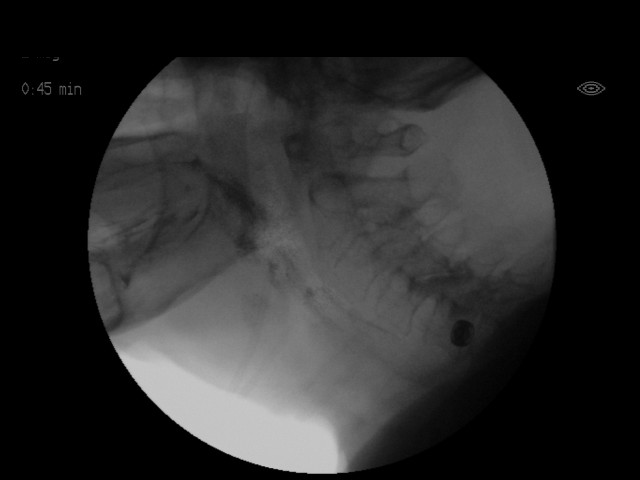
[im 7/18]
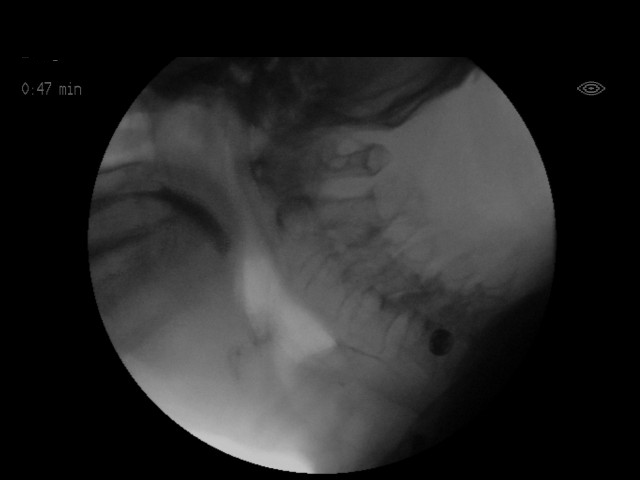
[im 8/18]
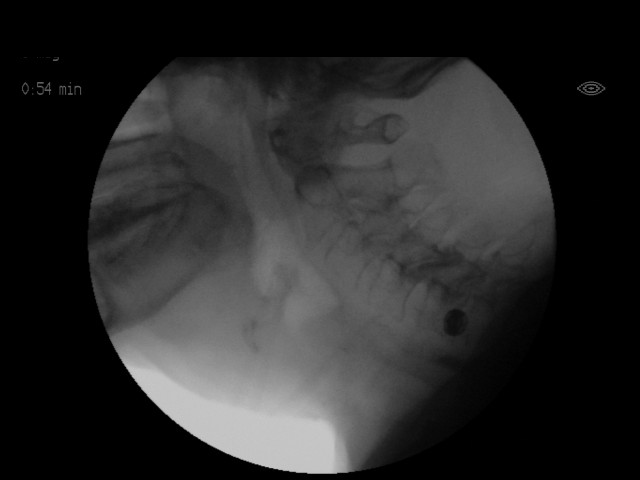
[im 9/18]
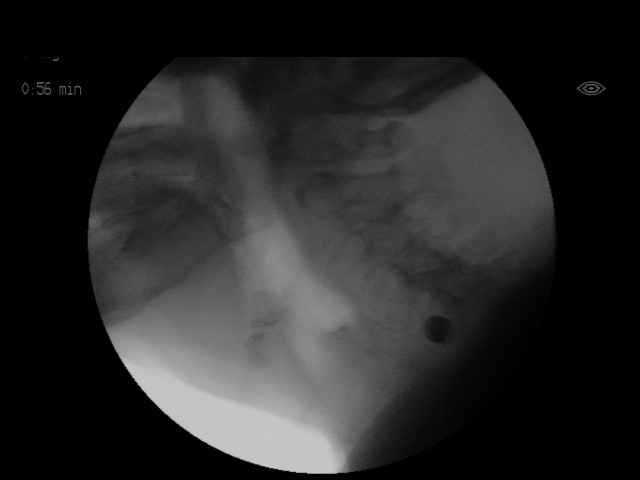
[im 10/18]
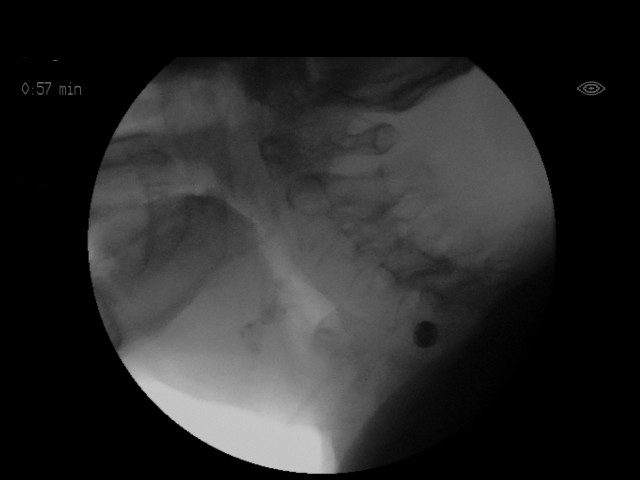
[im 11/18]
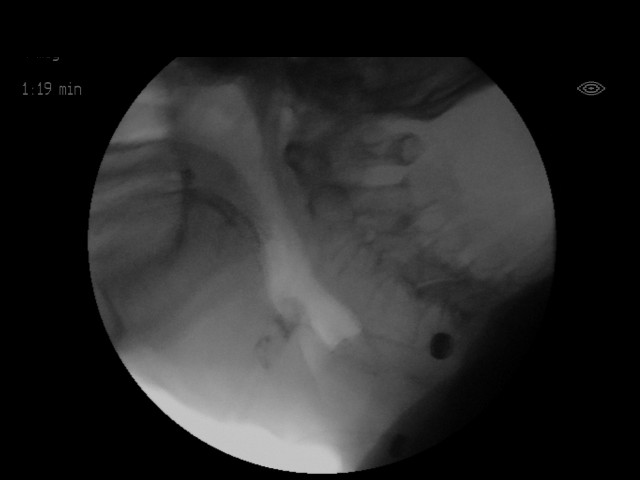
[im 12/18]
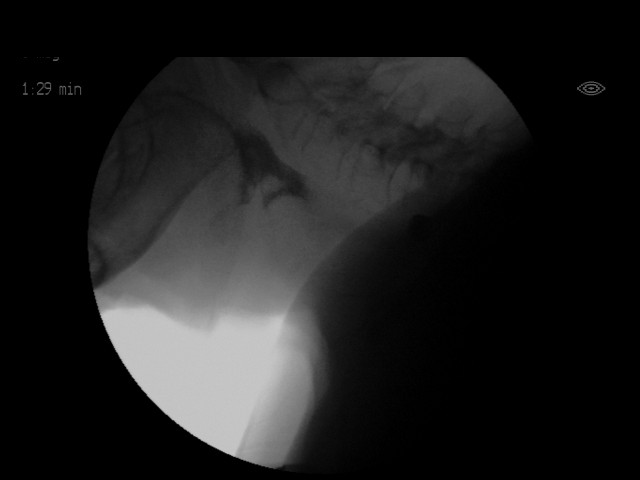
[im 13/18]
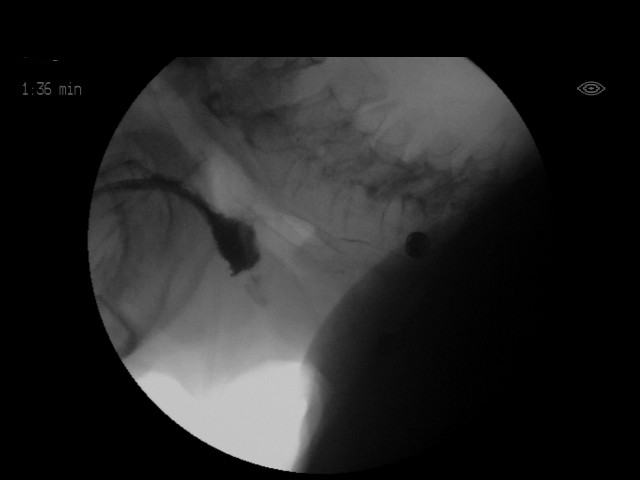
[im 15/18]
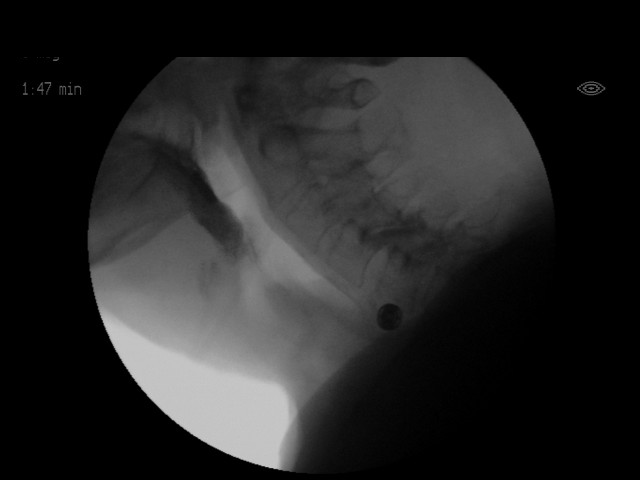
[im 15/18]
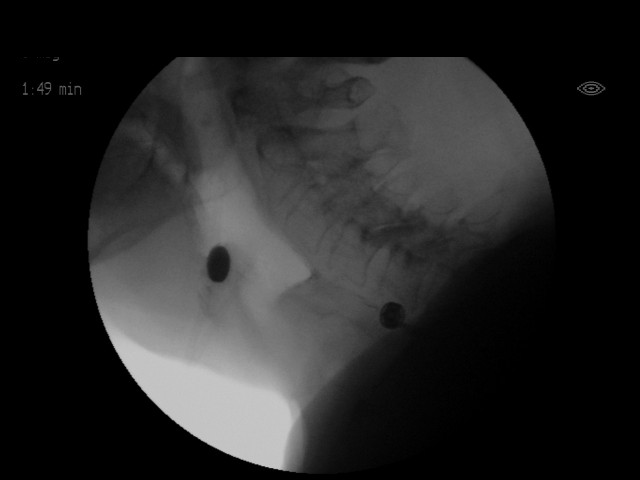
[im 16/18]
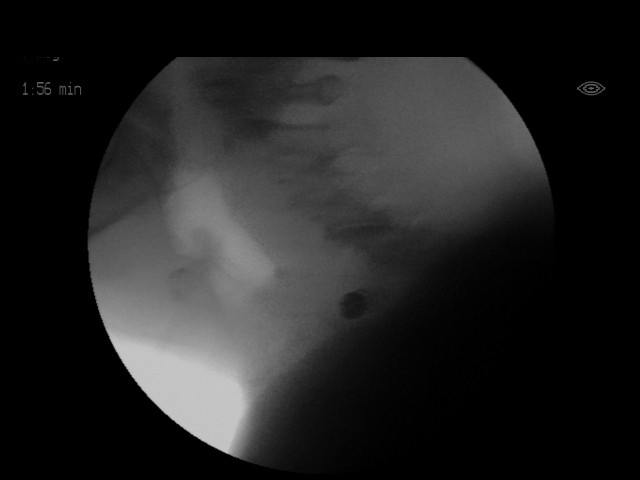
[im 18/18]
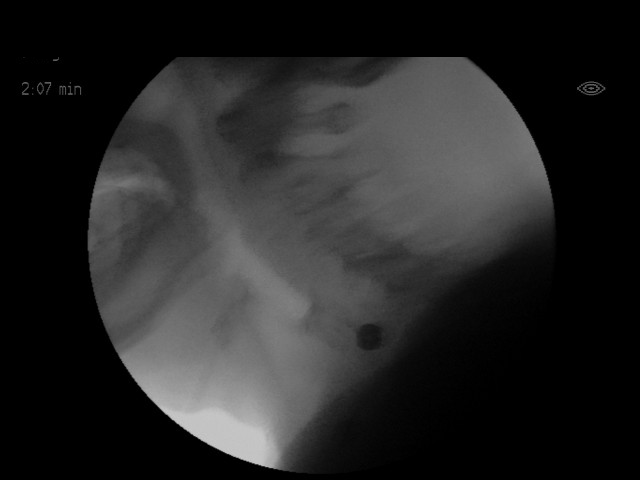
[im 18/18]
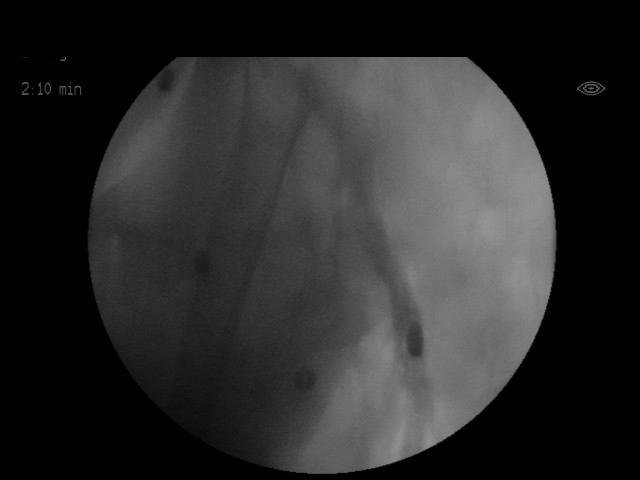

[18 of 24 positions shown; findings below may reference images not displayed]

FLUOROSCOPY FOR SWALLOWING FUNCTION STUDY:
Fluoroscopy was provided for swallowing function study, which was administered by a speech pathologist.  Final results and recommendations from this study are contained within the speech pathology report.
